# Patient Record
Sex: Female | Born: 1940 | Race: Black or African American | Hispanic: No | Smoking: Former smoker
Health system: Southern US, Community
[De-identification: ages and names within clinical notes are randomized; demographics above are authoritative.]

## PROBLEM LIST (undated history)

## (undated) DIAGNOSIS — M7732 Calcaneal spur, left foot: Principal | ICD-10-CM

## (undated) DIAGNOSIS — E669 Obesity, unspecified: Secondary | ICD-10-CM

## (undated) DIAGNOSIS — E559 Vitamin D deficiency, unspecified: Secondary | ICD-10-CM

## (undated) DIAGNOSIS — M329 Systemic lupus erythematosus, unspecified: Secondary | ICD-10-CM

## (undated) DIAGNOSIS — I10 Essential (primary) hypertension: Secondary | ICD-10-CM

## (undated) DIAGNOSIS — M19071 Primary osteoarthritis, right ankle and foot: Secondary | ICD-10-CM

## (undated) DIAGNOSIS — M170 Bilateral primary osteoarthritis of knee: Secondary | ICD-10-CM

## (undated) DIAGNOSIS — M19072 Primary osteoarthritis, left ankle and foot: Secondary | ICD-10-CM

## (undated) DIAGNOSIS — M19042 Primary osteoarthritis, left hand: Secondary | ICD-10-CM

## (undated) DIAGNOSIS — M19041 Primary osteoarthritis, right hand: Secondary | ICD-10-CM

## (undated) DIAGNOSIS — M7731 Calcaneal spur, right foot: Principal | ICD-10-CM

## (undated) DIAGNOSIS — M109 Gout, unspecified: Secondary | ICD-10-CM

## (undated) HISTORY — DX: Systemic lupus erythematosus, unspecified: M32.9

## (undated) HISTORY — DX: Primary osteoarthritis, left hand: M19.042

## (undated) HISTORY — DX: Primary osteoarthritis, left ankle and foot: M19.072

## (undated) HISTORY — PX: BREAST EXCISIONAL BIOPSY: SUR124

## (undated) HISTORY — DX: Primary osteoarthritis, right ankle and foot: M19.071

## (undated) HISTORY — DX: Calcaneal spur, left foot: M77.32

## (undated) HISTORY — DX: Primary osteoarthritis, right hand: M19.041

## (undated) HISTORY — DX: Bilateral primary osteoarthritis of knee: M17.0

## (undated) HISTORY — DX: Essential (primary) hypertension: I10

## (undated) HISTORY — DX: Obesity, unspecified: E66.9

## (undated) HISTORY — PX: BREAST SURGERY: SHX581

## (undated) HISTORY — DX: Vitamin D deficiency, unspecified: E55.9

## (undated) HISTORY — DX: Gout, unspecified: M10.9

## (undated) HISTORY — DX: Calcaneal spur, right foot: M77.31

---

## 1998-02-13 ENCOUNTER — Ambulatory Visit (HOSPITAL_COMMUNITY): Admission: RE | Admit: 1998-02-13 | Discharge: 1998-02-13 | Payer: Self-pay | Admitting: Obstetrics and Gynecology

## 1999-12-01 ENCOUNTER — Encounter: Payer: Self-pay | Admitting: Surgery

## 1999-12-01 ENCOUNTER — Encounter: Admission: RE | Admit: 1999-12-01 | Discharge: 1999-12-01 | Payer: Self-pay | Admitting: Surgery

## 2000-12-04 ENCOUNTER — Encounter: Payer: Self-pay | Admitting: Surgery

## 2000-12-04 ENCOUNTER — Encounter: Admission: RE | Admit: 2000-12-04 | Discharge: 2000-12-04 | Payer: Self-pay | Admitting: Surgery

## 2002-12-19 ENCOUNTER — Encounter: Payer: Self-pay | Admitting: Surgery

## 2002-12-19 ENCOUNTER — Encounter: Admission: RE | Admit: 2002-12-19 | Discharge: 2002-12-19 | Payer: Self-pay | Admitting: Family Medicine

## 2005-05-25 ENCOUNTER — Other Ambulatory Visit: Admission: RE | Admit: 2005-05-25 | Discharge: 2005-05-25 | Payer: Self-pay | Admitting: Family Medicine

## 2005-08-02 ENCOUNTER — Encounter: Admission: RE | Admit: 2005-08-02 | Discharge: 2005-08-02 | Payer: Self-pay | Admitting: Family Medicine

## 2005-08-29 ENCOUNTER — Encounter: Admission: RE | Admit: 2005-08-29 | Discharge: 2005-08-29 | Payer: Self-pay | Admitting: Family Medicine

## 2007-03-05 ENCOUNTER — Emergency Department (HOSPITAL_COMMUNITY): Admission: EM | Admit: 2007-03-05 | Discharge: 2007-03-05 | Payer: Self-pay | Admitting: Family Medicine

## 2007-04-04 ENCOUNTER — Encounter: Admission: RE | Admit: 2007-04-04 | Discharge: 2007-04-04 | Payer: Self-pay | Admitting: Surgery

## 2013-01-24 ENCOUNTER — Other Ambulatory Visit: Payer: Self-pay

## 2013-01-24 DIAGNOSIS — Z1231 Encounter for screening mammogram for malignant neoplasm of breast: Secondary | ICD-10-CM

## 2013-03-01 ENCOUNTER — Ambulatory Visit
Admission: RE | Admit: 2013-03-01 | Discharge: 2013-03-01 | Disposition: A | Discharge status: Home / Self Care | Payer: No Typology Code available for payment source | Source: Ambulatory Visit

## 2013-03-01 DIAGNOSIS — Z1231 Encounter for screening mammogram for malignant neoplasm of breast: Secondary | ICD-10-CM

## 2014-12-22 ENCOUNTER — Other Ambulatory Visit: Payer: Self-pay

## 2014-12-22 DIAGNOSIS — Z1231 Encounter for screening mammogram for malignant neoplasm of breast: Secondary | ICD-10-CM

## 2015-01-06 ENCOUNTER — Other Ambulatory Visit: Payer: Self-pay | Admitting: Internal Medicine

## 2015-01-06 ENCOUNTER — Ambulatory Visit
Admission: RE | Admit: 2015-01-06 | Discharge: 2015-01-06 | Disposition: A | Discharge status: Home / Self Care | Payer: Commercial Managed Care - HMO | Source: Ambulatory Visit

## 2015-01-06 DIAGNOSIS — R928 Other abnormal and inconclusive findings on diagnostic imaging of breast: Secondary | ICD-10-CM

## 2015-01-06 DIAGNOSIS — Z1231 Encounter for screening mammogram for malignant neoplasm of breast: Secondary | ICD-10-CM

## 2015-01-13 ENCOUNTER — Ambulatory Visit
Admission: RE | Admit: 2015-01-13 | Discharge: 2015-01-13 | Disposition: A | Discharge status: Home / Self Care | Payer: Commercial Managed Care - HMO | Source: Ambulatory Visit | Attending: Internal Medicine | Admitting: Internal Medicine

## 2015-01-13 DIAGNOSIS — R928 Other abnormal and inconclusive findings on diagnostic imaging of breast: Secondary | ICD-10-CM

## 2015-05-08 ENCOUNTER — Other Ambulatory Visit (HOSPITAL_COMMUNITY): Payer: Self-pay | Admitting: Rheumatology

## 2015-05-08 DIAGNOSIS — M79605 Pain in left leg: Secondary | ICD-10-CM

## 2015-05-28 ENCOUNTER — Other Ambulatory Visit: Payer: Self-pay | Admitting: Internal Medicine

## 2015-05-28 DIAGNOSIS — N6320 Unspecified lump in the left breast, unspecified quadrant: Secondary | ICD-10-CM

## 2015-07-14 ENCOUNTER — Ambulatory Visit
Admission: RE | Admit: 2015-07-14 | Discharge: 2015-07-14 | Disposition: A | Discharge status: Home / Self Care | Payer: Commercial Managed Care - HMO | Source: Ambulatory Visit | Attending: Internal Medicine | Admitting: Internal Medicine

## 2015-07-14 DIAGNOSIS — N6320 Unspecified lump in the left breast, unspecified quadrant: Secondary | ICD-10-CM

## 2015-09-02 ENCOUNTER — Other Ambulatory Visit (HOSPITAL_COMMUNITY): Payer: Self-pay | Admitting: Rheumatology

## 2015-09-02 DIAGNOSIS — M79605 Pain in left leg: Secondary | ICD-10-CM

## 2015-09-15 ENCOUNTER — Ambulatory Visit (HOSPITAL_COMMUNITY)
Admission: RE | Admit: 2015-09-15 | Discharge: 2015-09-15 | Disposition: A | Discharge status: Home / Self Care | Payer: Commercial Managed Care - HMO | Source: Ambulatory Visit | Attending: Rheumatology | Admitting: Rheumatology

## 2015-09-15 DIAGNOSIS — M170 Bilateral primary osteoarthritis of knee: Secondary | ICD-10-CM | POA: Insufficient documentation

## 2015-09-15 DIAGNOSIS — M79605 Pain in left leg: Secondary | ICD-10-CM | POA: Diagnosis present

## 2015-09-15 DIAGNOSIS — M11262 Other chondrocalcinosis, left knee: Secondary | ICD-10-CM | POA: Insufficient documentation

## 2015-09-15 DIAGNOSIS — M11261 Other chondrocalcinosis, right knee: Secondary | ICD-10-CM | POA: Insufficient documentation

## 2015-12-09 ENCOUNTER — Other Ambulatory Visit: Payer: Self-pay | Admitting: Internal Medicine

## 2015-12-09 DIAGNOSIS — N63 Unspecified lump in unspecified breast: Secondary | ICD-10-CM

## 2016-01-12 ENCOUNTER — Ambulatory Visit
Admission: RE | Admit: 2016-01-12 | Discharge: 2016-01-12 | Disposition: A | Discharge status: Home / Self Care | Payer: Commercial Managed Care - HMO | Source: Ambulatory Visit | Attending: Internal Medicine | Admitting: Internal Medicine

## 2016-01-12 DIAGNOSIS — N63 Unspecified lump in unspecified breast: Secondary | ICD-10-CM

## 2016-06-21 ENCOUNTER — Ambulatory Visit: Payer: Self-pay | Admitting: Rheumatology

## 2016-07-05 LAB — HEPATIC FUNCTION PANEL
ALK PHOS: 78 U/L (ref 25–125)
ALT: 49 U/L — AB (ref 7–35)
AST: 79 U/L — AB (ref 13–35)
Bilirubin, Total: 0.6 mg/dL

## 2016-07-05 LAB — BASIC METABOLIC PANEL
BUN: 14 mg/dL (ref 4–21)
CREATININE: 0.7 mg/dL (ref 0.5–1.1)
Glucose: 122 mg/dL
POTASSIUM: 3.5 mmol/L (ref 3.4–5.3)
SODIUM: 139 mmol/L (ref 137–147)

## 2016-07-05 LAB — CBC AND DIFFERENTIAL
HCT: 39 % (ref 36–46)
Hemoglobin: 13.2 g/dL (ref 12.0–16.0)
PLATELETS: 168 10*3/uL (ref 150–399)
WBC: 3.3 10^3/mL

## 2016-07-13 ENCOUNTER — Telehealth: Payer: Self-pay | Admitting: Radiology

## 2016-07-13 NOTE — Telephone Encounter (Signed)
Need to PA her Folic Acid [id number]

## 2016-07-14 NOTE — Telephone Encounter (Signed)
Completed a PA request via cover my meds

## 2016-07-15 ENCOUNTER — Telehealth: Payer: Self-pay | Admitting: Radiology

## 2016-07-15 NOTE — Telephone Encounter (Signed)
[facility] has denied the Folic acid Rx

## 2016-07-18 ENCOUNTER — Encounter: Payer: Self-pay | Admitting: *Deleted

## 2016-07-18 DIAGNOSIS — E559 Vitamin D deficiency, unspecified: Secondary | ICD-10-CM

## 2016-07-18 DIAGNOSIS — M19041 Primary osteoarthritis, right hand: Secondary | ICD-10-CM

## 2016-07-18 DIAGNOSIS — M329 Systemic lupus erythematosus, unspecified: Secondary | ICD-10-CM

## 2016-07-18 DIAGNOSIS — M19042 Primary osteoarthritis, left hand: Secondary | ICD-10-CM

## 2016-07-18 DIAGNOSIS — M19071 Primary osteoarthritis, right ankle and foot: Secondary | ICD-10-CM

## 2016-07-18 DIAGNOSIS — I10 Essential (primary) hypertension: Secondary | ICD-10-CM

## 2016-07-18 DIAGNOSIS — M170 Bilateral primary osteoarthritis of knee: Secondary | ICD-10-CM

## 2016-07-18 DIAGNOSIS — M7732 Calcaneal spur, left foot: Principal | ICD-10-CM

## 2016-07-18 DIAGNOSIS — E669 Obesity, unspecified: Secondary | ICD-10-CM

## 2016-07-18 DIAGNOSIS — M19072 Primary osteoarthritis, left ankle and foot: Secondary | ICD-10-CM

## 2016-07-18 DIAGNOSIS — M7731 Calcaneal spur, right foot: Secondary | ICD-10-CM | POA: Insufficient documentation

## 2016-07-18 HISTORY — DX: Systemic lupus erythematosus, unspecified: M32.9

## 2016-07-18 HISTORY — DX: Primary osteoarthritis, left ankle and foot: M19.071

## 2016-07-18 HISTORY — DX: Vitamin D deficiency, unspecified: E55.9

## 2016-07-18 HISTORY — DX: Obesity, unspecified: E66.9

## 2016-07-18 HISTORY — DX: Bilateral primary osteoarthritis of knee: M17.0

## 2016-07-18 HISTORY — DX: Primary osteoarthritis, right hand: M19.041

## 2016-07-18 HISTORY — DX: Essential (primary) hypertension: I10

## 2016-07-18 HISTORY — DX: Calcaneal spur, right foot: M77.31

## 2016-07-19 ENCOUNTER — Ambulatory Visit (INDEPENDENT_AMBULATORY_CARE_PROVIDER_SITE_OTHER): Payer: Commercial Managed Care - HMO | Admitting: Rheumatology

## 2016-07-19 ENCOUNTER — Encounter: Payer: Self-pay | Admitting: Rheumatology

## 2016-07-19 VITALS — BP 131/80 | HR 79 | Resp 14 | Ht 64.0 in | Wt 188.0 lb

## 2016-07-19 DIAGNOSIS — M19041 Primary osteoarthritis, right hand: Secondary | ICD-10-CM | POA: Diagnosis not present

## 2016-07-19 DIAGNOSIS — R7989 Other specified abnormal findings of blood chemistry: Secondary | ICD-10-CM

## 2016-07-19 DIAGNOSIS — R768 Other specified abnormal immunological findings in serum: Secondary | ICD-10-CM | POA: Insufficient documentation

## 2016-07-19 DIAGNOSIS — M19042 Primary osteoarthritis, left hand: Secondary | ICD-10-CM | POA: Diagnosis not present

## 2016-07-19 DIAGNOSIS — M170 Bilateral primary osteoarthritis of knee: Secondary | ICD-10-CM | POA: Diagnosis not present

## 2016-07-19 DIAGNOSIS — M19079 Primary osteoarthritis, unspecified ankle and foot: Secondary | ICD-10-CM | POA: Diagnosis not present

## 2016-07-19 DIAGNOSIS — M25511 Pain in right shoulder: Secondary | ICD-10-CM

## 2016-07-19 DIAGNOSIS — Z79899 Other long term (current) drug therapy: Secondary | ICD-10-CM | POA: Diagnosis not present

## 2016-07-19 DIAGNOSIS — R945 Abnormal results of liver function studies: Secondary | ICD-10-CM

## 2016-07-19 DIAGNOSIS — M329 Systemic lupus erythematosus, unspecified: Secondary | ICD-10-CM | POA: Diagnosis not present

## 2016-07-19 MED ORDER — FOLIC ACID 1 MG PO TABS: 1.0000 mg | ORAL_TABLET | Freq: Every day | ORAL | 4 refills | Status: DC

## 2016-07-19 MED ORDER — METHOTREXATE 2.5 MG PO TABS: 7.5000 mg | ORAL_TABLET | ORAL | 2 refills | Status: DC

## 2016-07-19 MED ORDER — LIDOCAINE HCL 1 % IJ SOLN
1.0000 mL | INTRAMUSCULAR | Status: AC | PRN
  Administered 2016-07-19: 1 mL

## 2016-07-19 MED ORDER — TRIAMCINOLONE ACETONIDE 40 MG/ML IJ SUSP
40.0000 mg | INTRAMUSCULAR | Status: AC | PRN
  Administered 2016-07-19: 40 mg via INTRA_ARTICULAR

## 2016-07-19 NOTE — Progress Notes (Signed)
*IMAGE* Office Visit Note  Patient: Deanna Hart             Date of Birth: Feb 16, 1941           MRN: [id number]             PCP: Salena Saner., MD Referring: No ref. provider found Visit Date: 07/19/2016 Occupation:     Subjective:  Follow-up Follow-up on lupus and high risk prescription.  History of Present Illness: Deanna Hart is a 75 y.o. female last seen in our office on 03/15/2016 at which time I followed her up on lupus. She was not having any flare on the June 2017 visit. She had no oral or nasal ulcers no fatigue at that time. She was responding well to methotrexate 3 pills per week and folic acid 1 pill per day.   Today she states that she is having ongoing right shoulder joint pain. She states "I wish I could get a shot for that in my shoulder". She is also recently finished her nitrofurantoin prescribed by Dr. Priscille Loveless for urinary tract infection. Patient initially had symptoms of severe urinary burning. Now after taking her antibiotic, she is all better.  She and is seeing Dr. Priscille Loveless who is at [place], [place] [address]     Patient is also complaining of right shoulder joint pain. She specifically requesting cortisone injection. Today her blood pressure is within normal limits and due to the decreased range of motion and pain I'LL  happy to give her the injection.    Activities of Daily Living:  Patient reports morning stiffness for 15 minutes.   Patient Reports nocturnal pain.  Difficulty dressing/grooming: Reports Difficulty climbing stairs: Reports Difficulty getting out of chair: Reports Difficulty using hands for taps, buttons, cutlery, and/or writing: Reports   Review of Systems  Constitutional: Negative for fatigue.  HENT: Negative for mouth sores and mouth dryness.   Eyes: Negative for dryness.  Respiratory: Negative for shortness of breath.   Gastrointestinal: Negative for constipation and diarrhea.   Musculoskeletal: Negative for myalgias and myalgias.  Skin: Negative for sensitivity to sunlight.  Psychiatric/Behavioral: Negative for decreased concentration and sleep disturbance.    PMFS History:  Patient Active Problem List   Diagnosis Date Noted  . High risk medications (not anticoagulants) long-term use 07/19/2016  . Rheumatoid factor positive 07/19/2016  . SLE (systemic lupus erythematosus) ([place]) 07/18/2016  . Osteoarthritis of both hands 07/18/2016  . Osteoarthritis of both feet 07/18/2016  . Calcaneal spur of both feet 07/18/2016  . Osteoarthritis of both knees 07/18/2016  . Hypertension 07/18/2016  . Obesity 07/18/2016  . Vitamin D deficiency 07/18/2016    Past Medical History:  Diagnosis Date  . Calcaneal spur of both feet 07/18/2016  . Hypertension 07/18/2016  . Obesity 07/18/2016  . Osteoarthritis of both feet 07/18/2016  . Osteoarthritis of both hands 07/18/2016  . 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   Marland Kitchen SLE (systemic lupus erythematosus) ([place]) 07/18/2016   Positive ANA, Positive Ro, Positive Smith, Positive RNP, Positive RF, Negative CCP,  Elevated LFTs  . Vitamin D deficiency 07/18/2016    History reviewed. No pertinent family history. History reviewed. No pertinent surgical history. Social History   Social History Narrative  . No narrative on file     Objective: Vital Signs: BP 131/80 (BP Location: Left Arm, Patient Position: Sitting, Cuff Size: Large)   Pulse 79   Resp  14   Ht 5\' 4"  (1.626 m)   Wt 188 lb (85.3 kg)   BMI 32.27 kg/m    Physical Exam  Constitutional: She is oriented to person, place, and time. She appears well-developed and well-nourished.  HENT:  Head: Normocephalic and atraumatic.  Eyes: EOM are normal. Pupils are equal, round, and reactive to light.  Cardiovascular: Normal rate, regular rhythm and normal heart sounds.  Exam reveals no gallop and no friction rub.   No murmur  heard. Pulmonary/Chest: Effort normal and breath sounds normal. She has no wheezes. She has no rales.  Abdominal: Soft. Bowel sounds are normal. She exhibits no distension. There is no tenderness. There is no guarding. No hernia.  Musculoskeletal: Normal range of motion. She exhibits no edema, tenderness or deformity.  Lymphadenopathy:    She has no cervical adenopathy.  Neurological: She is alert and oriented to person, place, and time. Coordination normal.  Skin: Skin is warm and dry. Capillary refill takes less than 2 seconds. No rash noted.  Psychiatric: She has a normal mood and affect. Her behavior is normal.  Nursing note and vitals reviewed.    Musculoskeletal Exam:  Full range of motion of all joints prescription strength is equal and strong bilaterally  fibromyalgia tender points are all absent  CDAI Exam: No CDAI exam completed.    Investigation: Findings:  Labs from 07/04/2016 shows CMP with GFR normal except for nonfasting glucose elevated at 122 AST is 79 A.l. T is 49. Both of these are elevated and we will monitor. She is actually on methotrexate 3 pills per week and we are unable to decrease methotrexate any further at this time. CBC with differential is normal except for WBC slightly low at 3.3 which we will monitor.  Labs from 03/15/2016 shows lupus anticoagulant negative. C3-C4 is normal ANA with titer is positive with a titer of 1:160 UA is normal Urine microscopic is normal.  =============== Here is the impresion and plan from last visit ==> IMPRESSION/PLAN:   1.  Systemic lupus erythematosus.  Positive ANA, Ro, Smith, RNP and rheumatoid factor.  No flare.  Doing well.  No oral or nasal ulcers.  No fatigue.  No new rash. 2.  High-risk prescription.  Labs up to date and normal and are due now.  Taking methotrexate 3 per week, folic acid 1 per day. 3.  OA of the hands.  DIP and PIP prominence bilaterally. 4.  OA of the feet and OA of the knee joint,  ongoing. 5.  CBC with diff, CMP with GFR, urinalysis today and then every 3 months. 6.  Add lupus anticoagulant, ANA with titer, ENA, C3, C4 to be done in 3 months.  7.  Refill methotrexate 3 per week, after the labs are up to date and are normal. 8.  Return to clinic in 4 months for regular followup.       Imaging: No results found.  Speciality Comments: No specialty comments available.    Procedures:  Large Joint Inj Date/Time: 07/19/2016 2:25 PM Performed by: Eliezer Lofts Authorized by: Eliezer Lofts   Consent Given by:  Patient Site marked: the procedure site was marked   Timeout: prior to procedure the correct patient, procedure, and site was verified   Indications:  Pain Location:  Shoulder Site:  R glenohumeral Prep: patient was prepped and draped in usual sterile fashion   Needle Size:  27 G Needle Length:  1.5 inches Approach:  Posterior Ultrasound Guidance: No   Fluoroscopic Guidance:  No   Arthrogram: No Medications:  1 mL lidocaine 1 %; 40 mg triamcinolone acetonide 40 MG/ML Aspiration Attempted: Yes   Aspirate amount (mL):  0 Patient tolerance:  Patient tolerated the procedure well with no immediate complications Comments: Patient is having right shoulder joint pain and is requesting cortisone injection in the right shoulder.  Blood pressure within normal limits.  After informed consent was obtained, the site was prepped in usual sterile fashion and injected with 40 mg of Kenalog mixed with 1 mL 1% lidocaine. Patient tolerated procedure well. There are no complications. There was no epinephrine and lidocaine.   Patient was over 90% better 01 minute after the injection. She tolerated procedure well. She is not allergic to lidocaine Betadine latex. Allergies: Plaquenil [hydroxychloroquine sulfate]   Assessment / Plan: Visit Diagnoses: Systemic lupus erythematosus, unspecified SLE type, unspecified organ involvement status ([facility])  High risk medications  (not anticoagulants) long-term use - MTX 3 pills per week every fridayFolic Acid 1mg  / day - Plan: CBC with Differential/Platelet, COMPLETE METABOLIC PANEL WITH GFR  Osteoarthritis of both hands, unspecified osteoarthritis type  Osteoarthritis of ankle and foot, unspecified laterality  Osteoarthritis of both knees, unspecified osteoarthritis type  Rheumatoid factor positive  Acute pain of right shoulder  Elevated liver function tests    Orders: Orders Placed This Encounter  Procedures  . Large Joint Injection/Arthrocentesis  . CBC with Differential/Platelet  . COMPLETE METABOLIC PANEL WITH GFR   Meds ordered this encounter  Medications  . DISCONTD: methotrexate (RHEUMATREX) 2.5 MG tablet    Sig: Take 3 tablets by mouth once a week.  . traMADol (ULTRAM) 50 MG tablet  . methotrexate (RHEUMATREX) 2.5 MG tablet    Sig: Take 3 tablets (7.5 mg total) by mouth once a week.    Dispense:  4 tablet    Refill:  2  . folic acid (FOLVITE) 1 MG tablet    Sig: Take 1 tablet (1 mg total) by mouth daily. This vitamin is essential for patient due to use of MTX. Patient cannot afford OTC vitamin. Without FOLIC ACID, pt may flare.    Dispense:  90 tablet    Refill:  4    Face-to-face time spent with patient was 30 minutes. 50% of time was spent in counseling and coordination of care.  Follow-Up Instructions: Return in about 4 months (around 11/16/2016) for SLE, HRRX, LFT's, right SJ Pain, .  I examined and evaluated the patient with Eliezer Lofts PA. The plan of care was discussed as noted above.  Bo Merino, MD

## 2016-07-19 NOTE — Patient Instructions (Signed)

## 2016-10-08 ENCOUNTER — Other Ambulatory Visit: Payer: Self-pay | Admitting: Rheumatology

## 2016-10-10 ENCOUNTER — Telehealth: Payer: Self-pay | Admitting: Rheumatology

## 2016-10-10 NOTE — Telephone Encounter (Signed)
LMOM for patient to call back to schedule appointment.  

## 2016-10-10 NOTE — Telephone Encounter (Signed)
-----   Message from Candice Camp, RT sent at 10/10/2016  8:44 AM EST ----- Patient needs follow up Feb/ March

## 2016-10-10 NOTE — Telephone Encounter (Signed)
I have called patient to advise labs are due Sent message for front desk to make a follow up appointment. She states she can come in tomorrow.

## 2016-10-11 ENCOUNTER — Other Ambulatory Visit: Payer: Self-pay | Admitting: Radiology

## 2016-10-11 ENCOUNTER — Other Ambulatory Visit: Payer: Self-pay | Admitting: *Deleted

## 2016-10-11 DIAGNOSIS — Z79899 Other long term (current) drug therapy: Secondary | ICD-10-CM

## 2016-10-11 LAB — CBC WITH DIFFERENTIAL/PLATELET
BASOS PCT: 0 %
Basophils Absolute: 0 cells/uL (ref 0–200)
EOS PCT: 1 %
Eosinophils Absolute: 41 cells/uL (ref 15–500)
HCT: 38.7 % (ref 35.0–45.0)
HEMOGLOBIN: 12.8 g/dL (ref 11.7–15.5)
LYMPHS ABS: 1599 {cells}/uL (ref 850–3900)
Lymphocytes Relative: 39 %
MCH: 32.4 pg (ref 27.0–33.0)
MCHC: 33.1 g/dL (ref 32.0–36.0)
MCV: 98 fL (ref 80.0–100.0)
MPV: 10.9 fL (ref 7.5–12.5)
Monocytes Absolute: 656 cells/uL (ref 200–950)
Monocytes Relative: 16 %
NEUTROS ABS: 1804 {cells}/uL (ref 1500–7800)
NEUTROS PCT: 44 %
Platelets: 180 10*3/uL (ref 140–400)
RBC: 3.95 MIL/uL (ref 3.80–5.10)
RDW: 14.4 % (ref 11.0–15.0)
WBC: 4.1 10*3/uL (ref 3.8–10.8)

## 2016-10-11 LAB — COMPLETE METABOLIC PANEL WITH GFR
ALBUMIN: 3.6 g/dL (ref 3.6–5.1)
ALK PHOS: 57 U/L (ref 33–130)
ALT: 39 U/L — AB (ref 6–29)
AST: 67 U/L — AB (ref 10–35)
BUN: 12 mg/dL (ref 7–25)
CHLORIDE: 104 mmol/L (ref 98–110)
CO2: 28 mmol/L (ref 20–31)
Calcium: 9.1 mg/dL (ref 8.6–10.4)
Creat: 0.61 mg/dL (ref 0.60–0.93)
GFR, Est African American: >89 mL/min (ref >=60)
GFR, Est Non African American: 88 mL/min (ref >=60)
GLUCOSE: 88 mg/dL (ref 65–99)
POTASSIUM: 4.1 mmol/L (ref 3.5–5.3)
SODIUM: 138 mmol/L (ref 135–146)
Total Bilirubin: 0.8 mg/dL (ref 0.2–1.2)
Total Protein: 7.3 g/dL (ref 6.1–8.1)

## 2016-10-11 MED ORDER — METHOTREXATE 2.5 MG PO TABS: 7.5000 mg | ORAL_TABLET | ORAL | 0 refills | Status: DC

## 2016-10-11 NOTE — Telephone Encounter (Signed)
Patient came in today for labs/ per Dr Merrilee Jansky to refill Methotrexate

## 2016-10-12 NOTE — Progress Notes (Signed)
Andrea,It's okay to refill patient's methotrexate.She has a history of elevated AST and ALT but it's improved versus the one from 3 months ago.I think you sent me a requests refill methotrexate but I signed off on his incentive approving it.If you will resend it to me or go ahead and send it in

## 2016-10-12 NOTE — Telephone Encounter (Signed)
Last Visit: 07/19/16 Next Visit: 11/29/16 Labs: 10/11/16 AST: 67 ALT: 39  Okay to refill MTX?

## 2016-10-12 NOTE — Progress Notes (Signed)
CMP with GFR is normal except for mild elevation of AST ALT which have already discussed above.CBC with differential is normal.

## 2016-11-15 ENCOUNTER — Ambulatory Visit: Payer: Commercial Managed Care - HMO | Admitting: Rheumatology

## 2016-11-28 DIAGNOSIS — M25511 Pain in right shoulder: Secondary | ICD-10-CM | POA: Insufficient documentation

## 2016-11-28 NOTE — Progress Notes (Signed)
Office Visit Note  Patient: Deanna Hart             Date of Birth: [birth year]           MRN: [id number]             PCP: Salena Saner., MD Referring: [name], MD Visit Date: 11/29/2016 Occupation: @GUAROCC @    Subjective: left knee pain   History of Present Illness: Deanna Hart is a 76 y.o. female with history of systemic lupus erythematosus and osteoarthritis. She states she's been doing fairly well except for some discomfort in her left knee off and on. She has noticed occasional swelling.  Activities of Daily Living:  Patient reports morning stiffness for 0 minute.   Patient Denies nocturnal pain.  Difficulty dressing/grooming: Denies Difficulty climbing stairs: Denies Difficulty getting out of chair: Denies Difficulty using hands for taps, buttons, cutlery, and/or writing: Denies   Review of Systems  Constitutional: Negative for fatigue, night sweats, weight gain, weight loss and weakness.  HENT: Negative for mouth sores, trouble swallowing, trouble swallowing, mouth dryness and nose dryness.   Eyes: Negative for pain, redness, visual disturbance and dryness.  Respiratory: Negative for cough, shortness of breath and difficulty breathing.   Cardiovascular: Negative for chest pain, palpitations, hypertension, irregular heartbeat and swelling in legs/feet.  Gastrointestinal: Negative for blood in stool, constipation and diarrhea.  Endocrine: Negative for increased urination.  Genitourinary: Negative for vaginal dryness.  Musculoskeletal: Positive for arthralgias and joint pain. Negative for joint swelling, myalgias, muscle weakness, morning stiffness, muscle tenderness and myalgias.  Skin: Negative for color change, rash, hair loss, skin tightness, ulcers and sensitivity to sunlight.  Allergic/Immunologic: Negative for susceptible to infections.  Neurological: Negative for dizziness, memory loss and night sweats.  Hematological: Negative for  swollen glands.  Psychiatric/Behavioral: Negative for depressed mood and sleep disturbance. The patient is not nervous/anxious.     PMFS History:  Patient Active Problem List   Diagnosis Date Noted  . Acute pain of right shoulder 11/28/2016  . High risk medications (not anticoagulants) long-term use 07/19/2016  . Rheumatoid factor positive 07/19/2016  . SLE (systemic lupus erythematosus) ([place]) 07/18/2016  . Primary osteoarthritis of both hands 07/18/2016  . Primary osteoarthritis of both feet 07/18/2016  . Calcaneal spur of both feet 07/18/2016  . Osteoarthritis of both knees 07/18/2016  . Hypertension 07/18/2016  . Obesity 07/18/2016  . Vitamin D deficiency 07/18/2016    Past Medical History:  Diagnosis Date  . Calcaneal spur of both feet 07/18/2016  . Hypertension 07/18/2016  . Obesity 07/18/2016  . Osteoarthritis of both feet 07/18/2016  . Osteoarthritis of both hands 07/18/2016  . 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   Marland Kitchen SLE (systemic lupus erythematosus) ([place]) 07/18/2016   Positive ANA, Positive Ro, Positive Smith, Positive RNP, Positive RF, Negative CCP,  Elevated LFTs  . Vitamin D deficiency 07/18/2016    No family history on file. No past surgical history on file. Social History   Social History Narrative  . No narrative on file     Objective: Vital Signs: BP (!) 171/93   Pulse 69   Resp 12   Ht 5\' 4"  (1.626 m)   Wt 188 lb (85.3 kg)   BMI 32.27 kg/m    Physical Exam  Constitutional: She is oriented to person, place, and time. She appears well-developed and well-nourished.  HENT:  Head: Normocephalic and atraumatic.  Eyes: Conjunctivae and EOM are normal.  Neck: Normal range of motion.  Cardiovascular: Normal rate, regular rhythm, normal heart sounds and intact distal pulses.   Pulmonary/Chest: Effort normal and breath sounds normal.  Abdominal: Soft. Bowel sounds are normal.  Lymphadenopathy:    She has no  cervical adenopathy.  Neurological: She is alert and oriented to person, place, and time.  Skin: Skin is warm and dry. Capillary refill takes less than 2 seconds.  Psychiatric: She has a normal mood and affect. Her behavior is normal.  Nursing note and vitals reviewed.    Musculoskeletal Exam: C-spine and thoracic lumbar spine good range of motion. Shoulder joints elbow joints good range of motion. She had no synovitis over her MCP joints. She has thickening of PIP/DIP and CMC joints consistent with osteoarthritis. She is good range of motion of her hip joints. She has crepitus with range of motion of bilateral knee joints some warmth on palpation of her left knee joint.  CDAI Exam: No CDAI exam completed.    Investigation: Findings:  Labs from 07/04/2016 shows CMP with GFR normal except for nonfasting glucose elevated at 122 AST is 79 A.l. T is 49. Both of these are elevated and we will monitor. She is actually on methotrexate 3 pills per week and we are unable to decrease methotrexate any further at this time. CBC with differential is normal except for WBC slightly low at 3.3 which we will monitor.  Labs from 03/15/2016 shows lupus anticoagulant negative. C3-C4 is normal ANA with titer is positive with a titer of 1:160 UA is normal Urine microscopic is normal.      Imaging: No results found.  Speciality Comments: No specialty comments available.    Procedures:  No procedures performed Allergies: Plaquenil [hydroxychloroquine sulfate]   Assessment / Plan:     Visit Diagnoses: Systemic lupus erythematosus, unspecified SLE type, unspecified organ involvement status ([place]) - History of ANA, Ro, Smith, RNP, RF, skin biopsy positive, elevated LFTs - Plan: Urinalysis, Routine w reflex microscopic. She is clinically doing well without any flare  High risk medications (not anticoagulants) long-term use - MTX- 2.5mg  3 pills per week q fridayFolic Acid 1mg  / day  - Plan: CBC with  Differential/Platelet, COMPLETE METABOLIC PANEL WITH GFR, CBC with Differential/Platelet, COMPLETE METABOLIC PANEL WITH GFR  Primary osteoarthritis of both hands: Joint protection and muscle strengthening discussed  Primary osteoarthritis of both knees: Weight loss muscle strengthening discussed  Primary osteoarthritis of both feet: Proper fitting shoes discussed  Acute pain of right shoulder  Rheumatoid factor positive    Orders: Orders Placed This Encounter  Procedures  . CBC with Differential/Platelet  . COMPLETE METABOLIC PANEL WITH GFR  . CBC with Differential/Platelet  . COMPLETE METABOLIC PANEL WITH GFR  . Urinalysis, Routine w reflex microscopic   No orders of the defined types were placed in this encounter.   Face-to-face time spent with patient was 30 minutes. 50% of time was spent in counseling and coordination of care.  Follow-Up Instructions: Return in about 5 months (around 05/01/2017) for Systemic lupus.   Bo Merino, MD  Note - This record has been created using Editor, commissioning.  Chart creation errors have been sought, but may not always  have been located. Such creation errors do not reflect on  the standard of medical care.

## 2016-11-29 ENCOUNTER — Encounter: Payer: Self-pay | Admitting: Rheumatology

## 2016-11-29 ENCOUNTER — Ambulatory Visit (INDEPENDENT_AMBULATORY_CARE_PROVIDER_SITE_OTHER): Payer: Commercial Managed Care - HMO | Admitting: Rheumatology

## 2016-11-29 VITALS — BP 171/93 | HR 69 | Resp 12 | Ht 64.0 in | Wt 188.0 lb

## 2016-11-29 DIAGNOSIS — M170 Bilateral primary osteoarthritis of knee: Secondary | ICD-10-CM | POA: Diagnosis not present

## 2016-11-29 DIAGNOSIS — Z79899 Other long term (current) drug therapy: Secondary | ICD-10-CM | POA: Diagnosis not present

## 2016-11-29 DIAGNOSIS — M19041 Primary osteoarthritis, right hand: Secondary | ICD-10-CM

## 2016-11-29 DIAGNOSIS — M329 Systemic lupus erythematosus, unspecified: Secondary | ICD-10-CM

## 2016-11-29 DIAGNOSIS — M19042 Primary osteoarthritis, left hand: Secondary | ICD-10-CM

## 2016-11-29 DIAGNOSIS — R768 Other specified abnormal immunological findings in serum: Secondary | ICD-10-CM

## 2016-11-29 DIAGNOSIS — M25511 Pain in right shoulder: Secondary | ICD-10-CM

## 2016-11-29 DIAGNOSIS — M19072 Primary osteoarthritis, left ankle and foot: Secondary | ICD-10-CM

## 2016-11-29 DIAGNOSIS — M19071 Primary osteoarthritis, right ankle and foot: Secondary | ICD-10-CM

## 2016-11-29 LAB — COMPLETE METABOLIC PANEL WITH GFR
ALBUMIN: 4 g/dL (ref 3.6–5.1)
ALT: 74 U/L — ABNORMAL HIGH (ref 6–29)
AST: 127 U/L — ABNORMAL HIGH (ref 10–35)
Alkaline Phosphatase: 70 U/L (ref 33–130)
BILIRUBIN TOTAL: 0.7 mg/dL (ref 0.2–1.2)
BUN: 9 mg/dL (ref 7–25)
CALCIUM: 9.1 mg/dL (ref 8.6–10.4)
CO2: 30 mmol/L (ref 20–31)
Chloride: 100 mmol/L (ref 98–110)
Creat: 0.7 mg/dL (ref 0.60–0.93)
GFR, EST NON AFRICAN AMERICAN: 84 mL/min (ref >=60)
Glucose, Bld: 84 mg/dL (ref 65–99)
POTASSIUM: 3.5 mmol/L (ref 3.5–5.3)
Sodium: 138 mmol/L (ref 135–146)
TOTAL PROTEIN: 8 g/dL (ref 6.1–8.1)

## 2016-11-29 LAB — CBC WITH DIFFERENTIAL/PLATELET
BASOS ABS: 0 {cells}/uL (ref 0–200)
Basophils Relative: 0 %
EOS ABS: 36 {cells}/uL (ref 15–500)
Eosinophils Relative: 1 %
HEMATOCRIT: 40.6 % (ref 35.0–45.0)
HEMOGLOBIN: 13.6 g/dL (ref 11.7–15.5)
LYMPHS ABS: 1296 {cells}/uL (ref 850–3900)
Lymphocytes Relative: 36 %
MCH: 32.9 pg (ref 27.0–33.0)
MCHC: 33.5 g/dL (ref 32.0–36.0)
MCV: 98.1 fL (ref 80.0–100.0)
MPV: 10.7 fL (ref 7.5–12.5)
Monocytes Absolute: 468 cells/uL (ref 200–950)
Monocytes Relative: 13 %
NEUTROS PCT: 50 %
Neutro Abs: 1800 cells/uL (ref 1500–7800)
Platelets: 198 10*3/uL (ref 140–400)
RBC: 4.14 MIL/uL (ref 3.80–5.10)
RDW: 13.8 % (ref 11.0–15.0)
WBC: 3.6 10*3/uL — ABNORMAL LOW (ref 3.8–10.8)

## 2016-11-29 NOTE — Patient Instructions (Signed)
Standing Labs We placed an order today for your standing lab work.    Please come back and get your standing labs in June and every 3 months  We have open lab Monday through Friday from 8:30-11:30 AM and 1:30-4 PM at the office of Dr. Jong Rickman/Naitik Panwala, PA.   The office is located at [address], [address], [place], [place] [address] No appointment is necessary.   Labs are drawn by [facility].  You may receive a bill from [facility] for your lab work.    

## 2016-11-30 LAB — URINALYSIS, ROUTINE W REFLEX MICROSCOPIC
Bilirubin Urine: NEGATIVE
GLUCOSE, UA: NEGATIVE
HGB URINE DIPSTICK: NEGATIVE
Ketones, ur: NEGATIVE
Nitrite: NEGATIVE
PROTEIN: NEGATIVE
Specific Gravity, Urine: 1.011 (ref 1.001–1.035)
pH: 6.5 (ref 5.0–8.0)

## 2016-11-30 LAB — URINALYSIS, MICROSCOPIC ONLY
Casts: NONE SEEN [LPF]
Crystals: NONE SEEN [HPF]
RBC / HPF: NONE SEEN RBC/HPF (ref <=2)
YEAST: NONE SEEN [HPF]

## 2016-11-30 NOTE — Progress Notes (Signed)
Please call patient to check if she is taking any of the medications recently which could have affected her liver function. She should not take any anti-inflammatories. She should not drink alcohol. We should repeat her LFTs in 2 weeks.

## 2016-12-13 ENCOUNTER — Other Ambulatory Visit: Payer: Self-pay | Admitting: *Deleted

## 2016-12-13 DIAGNOSIS — Z79899 Other long term (current) drug therapy: Secondary | ICD-10-CM

## 2016-12-14 ENCOUNTER — Other Ambulatory Visit: Payer: Self-pay | Admitting: Family

## 2016-12-14 DIAGNOSIS — N6320 Unspecified lump in the left breast, unspecified quadrant: Secondary | ICD-10-CM

## 2016-12-14 LAB — COMPLETE METABOLIC PANEL WITH GFR
ALT: 68 U/L — ABNORMAL HIGH (ref 6–29)
AST: 121 U/L — AB (ref 10–35)
Albumin: 3.8 g/dL (ref 3.6–5.1)
Alkaline Phosphatase: 68 U/L (ref 33–130)
BUN: 14 mg/dL (ref 7–25)
CALCIUM: 9.2 mg/dL (ref 8.6–10.4)
CHLORIDE: 101 mmol/L (ref 98–110)
CO2: 26 mmol/L (ref 20–31)
Creat: 0.68 mg/dL (ref 0.60–0.93)
GFR, Est Non African American: 85 mL/min (ref >=60)
Glucose, Bld: 124 mg/dL — ABNORMAL HIGH (ref 65–99)
POTASSIUM: 3.6 mmol/L (ref 3.5–5.3)
Sodium: 136 mmol/L (ref 135–146)
Total Bilirubin: 0.6 mg/dL (ref 0.2–1.2)
Total Protein: 7.9 g/dL (ref 6.1–8.1)

## 2016-12-14 NOTE — Progress Notes (Signed)
Patient should discontinue methotrexate. Repeat LFTs and TPMT in 2 weeks if it's still elevated will refer her to GI. [facility] in [facility]

## 2016-12-27 NOTE — Progress Notes (Signed)
Office Visit Note  Patient: Deanna Hart             Date of Birth: 01-28-1941           MRN: [id number]             PCP: Salena Saner., MD Referring: [name], MD Visit Date: 01/10/2017 Occupation: '@GUAROCC' @    Subjective:  Left knee pain  History of Present Illness: Deanna Hart is a 76 y.o. female with a history of SLE and OA who presents for follow-up.  She reports she has discontinued her Methotrexate due to elevation of LFTs.  Patient reports she has missed 5 doses. She states she is still taking her folic acid daily.  Patient denies any side effects since stopping methotrexate.  Patient states she has not changed any other medications recently and denies alcohol use. Patient states she does not want to be put back on MTX.    Patient states she is currently having left knee swelling, discomfort, and "heat."  Patient states her last knee injection helped and lasted a long time.  Patient states her right knee has some discomfort, especially if she is on it for a long period of time.     She reports some pedal edema in the evening.  Patient reports her pain is 5/10.  Denies any morning stiffness. Patient desires a cortisone injection today.    Activities of Daily Living:  Patient reports morning stiffness for 0 minutes.   Patient Denies nocturnal pain.  Difficulty dressing/grooming: Denies Difficulty climbing stairs: Denies Difficulty getting out of chair: Denies Difficulty using hands for taps, buttons, cutlery, and/or writing: Denies   Review of Systems  Constitutional: Negative for fatigue, night sweats, weight gain, weight loss and weakness.  HENT: Negative for mouth sores, trouble swallowing, trouble swallowing, mouth dryness and nose dryness.   Eyes: Negative for pain, redness, visual disturbance and dryness.  Respiratory: Negative for cough, shortness of breath and difficulty breathing.   Cardiovascular: Negative for chest pain, palpitations,  hypertension, irregular heartbeat and swelling in legs/feet.  Gastrointestinal: Negative for abdominal pain, blood in stool, constipation, diarrhea and vomiting.  Endocrine: Negative for increased urination.  Genitourinary: Negative for painful urination and vaginal dryness.  Musculoskeletal: Positive for arthralgias and joint pain. Negative for joint swelling, myalgias, muscle weakness, morning stiffness, muscle tenderness and myalgias.  Skin: Positive for hair loss (Triamcinolone cream is applied on the scalp twice daily helps with itchiness). Negative for color change, rash, skin tightness, ulcers and sensitivity to sunlight.  Allergic/Immunologic: Negative for susceptible to infections.  Neurological: Negative for dizziness, memory loss and night sweats.  Hematological: Negative for swollen glands.  Psychiatric/Behavioral: Negative for depressed mood and sleep disturbance. The patient is not nervous/anxious.     PMFS History:  Patient Active Problem List   Diagnosis Date Noted  . Acute pain of right shoulder 11/28/2016  . High risk medications (not anticoagulants) long-term use 07/19/2016  . Rheumatoid factor positive 07/19/2016  . SLE (systemic lupus erythematosus) ([place]) 07/18/2016  . Primary osteoarthritis of both hands 07/18/2016  . Primary osteoarthritis of both feet 07/18/2016  . Calcaneal spur of both feet 07/18/2016  . Osteoarthritis of both knees 07/18/2016  . Hypertension 07/18/2016  . Obesity 07/18/2016  . Vitamin D deficiency 07/18/2016    Past Medical History:  Diagnosis Date  . Calcaneal spur of both feet 07/18/2016  . Hypertension 07/18/2016  . Obesity 07/18/2016  . Osteoarthritis of both feet 07/18/2016  . Osteoarthritis  of both hands 07/18/2016  . 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   Marland Kitchen SLE (systemic lupus erythematosus) ([place]) 07/18/2016   Positive ANA, Positive Ro, Positive Smith, Positive RNP, Positive RF, Negative CCP,   Elevated LFTs  . Vitamin D deficiency 07/18/2016    Family History  Problem Relation Age of Onset  . Cancer Brother    Past Surgical History:  Procedure Laterality Date  . BREAST SURGERY     Social History   Social History Narrative  . No narrative on file     Objective: Vital Signs: BP (!) 155/84 (BP Location: Left Arm, Patient Position: Sitting, Cuff Size: Normal)   Pulse 70   Resp 16   Ht '5\' 4"'  (1.626 m)   Wt 192 lb (87.1 kg)   BMI 32.96 kg/m    Physical Exam  Constitutional: She is oriented to person, place, and time. She appears well-developed and well-nourished.  HENT:  Head: Normocephalic and atraumatic.  Eyes: Conjunctivae and EOM are normal. Pupils are equal, round, and reactive to light.  Neck: Normal range of motion.  Cardiovascular: Normal rate, regular rhythm, normal heart sounds and intact distal pulses.   Pulmonary/Chest: Effort normal and breath sounds normal.  Abdominal: Soft. Bowel sounds are normal.  Lymphadenopathy:    She has no cervical adenopathy.  Neurological: She is alert and oriented to person, place, and time.  Skin: Skin is warm and dry. Capillary refill takes less than 2 seconds.  Psychiatric: She has a normal mood and affect. Her behavior is normal.  Nursing note and vitals reviewed.    Musculoskeletal Exam: C-spine and thoracic, lumbar spine good range of motion. Shoulder joints elbow joints wrist joints are good range of motion. She had no synovitis or wrists joint or MCP joints. She is some thickening of bilateral DIP joints consistent with osteoarthritis. Hip joints with good range of motion with no discomfort. Her left knee joint had some warmth and swelling. Ankle joints are good range of motion.  CDAI Exam: No CDAI exam completed.    Investigation: No additional findings. Orders Only on 12/30/2016  Component Date Value Ref Range Status  . WBC 12/30/2016 4.6  3.8 - 10.8 K/uL Final  . RBC 12/30/2016 4.00  3.80 - 5.10 MIL/uL  Final  . Hemoglobin 12/30/2016 12.9  11.7 - 15.5 g/dL Final  . HCT 12/30/2016 38.5  35.0 - 45.0 % Final  . MCV 12/30/2016 96.3  80.0 - 100.0 fL Final  . MCH 12/30/2016 32.3  27.0 - 33.0 pg Final  . MCHC 12/30/2016 33.5  32.0 - 36.0 g/dL Final  . RDW 12/30/2016 13.6  11.0 - 15.0 % Final  . Platelets 12/30/2016 203  140 - 400 K/uL Final  . MPV 12/30/2016 11.0  7.5 - 12.5 fL Final  . Neutro Abs 12/30/2016 2116  1,500 - 7,800 cells/uL Final  . Lymphs Abs 12/30/2016 1518  850 - 3,900 cells/uL Final  . Monocytes Absolute 12/30/2016 874  200 - 950 cells/uL Final  . Eosinophils Absolute 12/30/2016 92  15 - 500 cells/uL Final  . Basophils Absolute 12/30/2016 0  0 - 200 cells/uL Final  . Neutrophils Relative % 12/30/2016 46  % Final  . Lymphocytes Relative 12/30/2016 33  % Final  . Monocytes Relative 12/30/2016 19  % Final  . Eosinophils Relative 12/30/2016 2  % Final  . Basophils Relative 12/30/2016 0  % Final  . Smear Review 12/30/2016 Criteria for review not met  Final  . Sodium 12/30/2016 136  135 - 146 mmol/L Final  . Potassium 12/30/2016 3.6  3.5 - 5.3 mmol/L Final  . Chloride 12/30/2016 101  98 - 110 mmol/L Final  . CO2 12/30/2016 27  20 - 31 mmol/L Final  . Glucose, Bld 12/30/2016 69  65 - 99 mg/dL Final  . BUN 12/30/2016 14  7 - 25 mg/dL Final  . Creat 12/30/2016 0.66  0.60 - 0.93 mg/dL Final   Comment:   For patients > or = 77 years of age: The upper reference limit for Creatinine is approximately 13% higher for people identified as African-American.     . Total Bilirubin 12/30/2016 0.7  0.2 - 1.2 mg/dL Final  . Alkaline Phosphatase 12/30/2016 76  33 - 130 U/L Final  . AST 12/30/2016 84* 10 - 35 U/L Final  . ALT 12/30/2016 55* 6 - 29 U/L Final  . Total Protein 12/30/2016 8.0  6.1 - 8.1 g/dL Final  . Albumin 12/30/2016 3.7  3.6 - 5.1 g/dL Final  . Calcium 12/30/2016 9.2  8.6 - 10.4 mg/dL Final  . GFR, Est African American 12/30/2016 >89  >=60 mL/min Final  . GFR, Est Non  African American 12/30/2016 86  >=60 mL/min Final  Orders Only on 12/13/2016  Component Date Value Ref Range Status  . Sodium 12/13/2016 136  135 - 146 mmol/L Final  . Potassium 12/13/2016 3.6  3.5 - 5.3 mmol/L Final  . Chloride 12/13/2016 101  98 - 110 mmol/L Final  . CO2 12/13/2016 26  20 - 31 mmol/L Final  . Glucose, Bld 12/13/2016 124* 65 - 99 mg/dL Final  . BUN 12/13/2016 14  7 - 25 mg/dL Final  . Creat 12/13/2016 0.68  0.60 - 0.93 mg/dL Final   Comment:   For patients > or = 77 years of age: The upper reference limit for Creatinine is approximately 13% higher for people identified as African-American.     . Total Bilirubin 12/13/2016 0.6  0.2 - 1.2 mg/dL Final  . Alkaline Phosphatase 12/13/2016 68  33 - 130 U/L Final  . AST 12/13/2016 121* 10 - 35 U/L Final  . ALT 12/13/2016 68* 6 - 29 U/L Final  . Total Protein 12/13/2016 7.9  6.1 - 8.1 g/dL Final  . Albumin 12/13/2016 3.8  3.6 - 5.1 g/dL Final  . Calcium 12/13/2016 9.2  8.6 - 10.4 mg/dL Final  . GFR, Est African American 12/13/2016 >89  >=60 mL/min Final  . GFR, Est Non African American 12/13/2016 85  >=60 mL/min Final  Office Visit on 11/29/2016  Component Date Value Ref Range Status  . WBC 11/29/2016 3.6* 3.8 - 10.8 K/uL Final  . RBC 11/29/2016 4.14  3.80 - 5.10 MIL/uL Final  . Hemoglobin 11/29/2016 13.6  11.7 - 15.5 g/dL Final  . HCT 11/29/2016 40.6  35.0 - 45.0 % Final  . MCV 11/29/2016 98.1  80.0 - 100.0 fL Final  . MCH 11/29/2016 32.9  27.0 - 33.0 pg Final  . MCHC 11/29/2016 33.5  32.0 - 36.0 g/dL Final  . RDW 11/29/2016 13.8  11.0 - 15.0 % Final  . Platelets 11/29/2016 198  140 - 400 K/uL Final  . MPV 11/29/2016 10.7  7.5 - 12.5 fL Final  . Neutro Abs 11/29/2016 1800  1,500 - 7,800 cells/uL Final  . Lymphs Abs 11/29/2016 1296  850 - 3,900 cells/uL Final  . Monocytes Absolute 11/29/2016 468  200 - 950 cells/uL Final  . Eosinophils Absolute  11/29/2016 36  15 - 500 cells/uL Final  . Basophils Absolute 11/29/2016 0   0 - 200 cells/uL Final  . Neutrophils Relative % 11/29/2016 50  % Final  . Lymphocytes Relative 11/29/2016 36  % Final  . Monocytes Relative 11/29/2016 13  % Final  . Eosinophils Relative 11/29/2016 1  % Final  . Basophils Relative 11/29/2016 0  % Final  . Smear Review 11/29/2016 Criteria for review not met   Final  . Sodium 11/29/2016 138  135 - 146 mmol/L Final  . Potassium 11/29/2016 3.5  3.5 - 5.3 mmol/L Final  . Chloride 11/29/2016 100  98 - 110 mmol/L Final  . CO2 11/29/2016 30  20 - 31 mmol/L Final  . Glucose, Bld 11/29/2016 84  65 - 99 mg/dL Final  . BUN 11/29/2016 9  7 - 25 mg/dL Final  . Creat 11/29/2016 0.70  0.60 - 0.93 mg/dL Final   Comment:   For patients > or = 77 years of age: The upper reference limit for Creatinine is approximately 13% higher for people identified as African-American.     . Total Bilirubin 11/29/2016 0.7  0.2 - 1.2 mg/dL Final  . Alkaline Phosphatase 11/29/2016 70  33 - 130 U/L Final  . AST 11/29/2016 127* 10 - 35 U/L Final  . ALT 11/29/2016 74* 6 - 29 U/L Final  . Total Protein 11/29/2016 8.0  6.1 - 8.1 g/dL Final  . Albumin 11/29/2016 4.0  3.6 - 5.1 g/dL Final  . Calcium 11/29/2016 9.1  8.6 - 10.4 mg/dL Final  . GFR, Est African American 11/29/2016 >89  >=60 mL/min Final  . GFR, Est Non African American 11/29/2016 84  >=60 mL/min Final  . Color, Urine 11/29/2016 YELLOW  YELLOW Final  . APPearance 11/29/2016 CLEAR  CLEAR Final  . Specific Gravity, Urine 11/29/2016 1.011  1.001 - 1.035 Final  . pH 11/29/2016 6.5  5.0 - 8.0 Final  . Glucose, UA 11/29/2016 NEGATIVE  NEGATIVE Final  . Bilirubin Urine 11/29/2016 NEGATIVE  NEGATIVE Final  . Ketones, ur 11/29/2016 NEGATIVE  NEGATIVE Final  . Hgb urine dipstick 11/29/2016 NEGATIVE  NEGATIVE Final  . Protein, ur 11/29/2016 NEGATIVE  NEGATIVE Final  . Nitrite 11/29/2016 NEGATIVE  NEGATIVE Final  . Leukocytes, UA 11/29/2016 1+* NEGATIVE Final  . WBC, UA 11/29/2016 0-5  <=5 WBC/HPF Final  . RBC / HPF  11/29/2016 NONE SEEN  <=2 RBC/HPF Final  . Squamous Epithelial / LPF 11/29/2016 6-10* <=5 HPF Final  . Bacteria, UA 11/29/2016 FEW* NONE SEEN HPF Final  . Crystals 11/29/2016 NONE SEEN  NONE SEEN HPF Final  . Casts 11/29/2016 NONE SEEN  NONE SEEN LPF Final  . Yeast 11/29/2016 NONE SEEN  NONE SEEN HPF Final    Imaging: No results found.  Speciality Comments: No specialty comments available.    Procedures:  No procedures performed Allergies: Plaquenil [hydroxychloroquine sulfate]   Assessment / Plan:     Visit Diagnoses: Other systemic lupus erythematosus with other organ involvement ([place]) - History of positive ANA, Ro, Smith, RNP, RF, skin biopsy, elevated LFTs -Clinically she is doing fine currently except for the left knee joint warmth and swelling. She was taken off methotrexate due to elevation of LFTs. I would like to repeat her labs in near future and decide if he to put her on Plaquenil. Plan: Sedimentation rate, ANA, [id number] ENA PANEL, Urinalysis, Routine w reflex microscopic  High risk medications (not anticoagulants) long-term use - Methotrexate was discontinued due to  elevated LFTs, folic acid,- Plan: COMPLETE METABOLIC PANEL WITH GFR, Glucose 6 phosphate dehydrogenase  Primary osteoarthritis of both hands: She continues to have some discomfort in her hands which is tolerable no synovitis was noted.  Primary osteoarthritis of both knees: She has chronic discomfort in her knee joints due to underlying osteoarthritis.  Chronic pain of left knee - Plan: Large Joint Injection/Arthrocentesis.  Request after informed consent was obtained left knee joint was injected with 4-1/2 mL of lidocaine and 40 mg of cortisone. She tolerated the procedure well. The procedure is described in a separate note.  Primary osteoarthritis of both feet  Calcaneal spur of both feet  Vitamin D deficiency  History of hypertension   May consider switching to Imuran or Benlysta. Check autoimmune  labs Orders: Orders Placed This Encounter  Procedures  . Large Joint Injection/Arthrocentesis  . COMPLETE METABOLIC PANEL WITH GFR  . Sedimentation rate  . ANA  . [id number] ENA PANEL  . Glucose 6 phosphate dehydrogenase  . Urinalysis, Routine w reflex microscopic   No orders of the defined types were placed in this encounter.   Face-to-face time spent with patient was 30 minutes. 50% of time was spent in counseling and coordination of care.  Follow-Up Instructions: Return in about 2 months (around 03/12/2017) for Systemic lupus.   Bo Merino, MD  Note - This record has been created using Editor, commissioning.  Chart creation errors have been sought, but may not always  have been located. Such creation errors do not reflect on  the standard of medical care.

## 2016-12-30 ENCOUNTER — Other Ambulatory Visit: Payer: Self-pay | Admitting: *Deleted

## 2016-12-30 DIAGNOSIS — Z79899 Other long term (current) drug therapy: Secondary | ICD-10-CM

## 2016-12-30 LAB — COMPLETE METABOLIC PANEL WITH GFR
ALBUMIN: 3.7 g/dL (ref 3.6–5.1)
ALT: 55 U/L — AB (ref 6–29)
AST: 84 U/L — ABNORMAL HIGH (ref 10–35)
Alkaline Phosphatase: 76 U/L (ref 33–130)
BILIRUBIN TOTAL: 0.7 mg/dL (ref 0.2–1.2)
BUN: 14 mg/dL (ref 7–25)
CALCIUM: 9.2 mg/dL (ref 8.6–10.4)
CO2: 27 mmol/L (ref 20–31)
CREATININE: 0.66 mg/dL (ref 0.60–0.93)
Chloride: 101 mmol/L (ref 98–110)
GFR, Est Non African American: 86 mL/min (ref >=60)
Glucose, Bld: 69 mg/dL (ref 65–99)
Potassium: 3.6 mmol/L (ref 3.5–5.3)
Sodium: 136 mmol/L (ref 135–146)
TOTAL PROTEIN: 8 g/dL (ref 6.1–8.1)

## 2016-12-30 LAB — CBC WITH DIFFERENTIAL/PLATELET
BASOS PCT: 0 %
Basophils Absolute: 0 cells/uL (ref 0–200)
EOS ABS: 92 {cells}/uL (ref 15–500)
Eosinophils Relative: 2 %
HEMATOCRIT: 38.5 % (ref 35.0–45.0)
HEMOGLOBIN: 12.9 g/dL (ref 11.7–15.5)
LYMPHS ABS: 1518 {cells}/uL (ref 850–3900)
Lymphocytes Relative: 33 %
MCH: 32.3 pg (ref 27.0–33.0)
MCHC: 33.5 g/dL (ref 32.0–36.0)
MCV: 96.3 fL (ref 80.0–100.0)
MONO ABS: 874 {cells}/uL (ref 200–950)
MPV: 11 fL (ref 7.5–12.5)
Monocytes Relative: 19 %
NEUTROS ABS: 2116 {cells}/uL (ref 1500–7800)
Neutrophils Relative %: 46 %
PLATELETS: 203 10*3/uL (ref 140–400)
RBC: 4 MIL/uL (ref 3.80–5.10)
RDW: 13.6 % (ref 11.0–15.0)
WBC: 4.6 10*3/uL (ref 3.8–10.8)

## 2017-01-03 ENCOUNTER — Other Ambulatory Visit: Payer: Self-pay | Admitting: Rheumatology

## 2017-01-03 NOTE — Telephone Encounter (Signed)
Last Visit: 11/28/16 Next Visit: 01/10/17  Okay to refill Folic Acid?

## 2017-01-03 NOTE — Telephone Encounter (Signed)
ok 

## 2017-01-10 ENCOUNTER — Encounter: Payer: Self-pay | Admitting: Rheumatology

## 2017-01-10 ENCOUNTER — Ambulatory Visit (INDEPENDENT_AMBULATORY_CARE_PROVIDER_SITE_OTHER): Payer: Medicare HMO | Admitting: Rheumatology

## 2017-01-10 VITALS — BP 155/84 | HR 70 | Resp 16 | Ht 64.0 in | Wt 192.0 lb

## 2017-01-10 DIAGNOSIS — Z8679 Personal history of other diseases of the circulatory system: Secondary | ICD-10-CM

## 2017-01-10 DIAGNOSIS — G8929 Other chronic pain: Secondary | ICD-10-CM | POA: Diagnosis not present

## 2017-01-10 DIAGNOSIS — Z79899 Other long term (current) drug therapy: Secondary | ICD-10-CM

## 2017-01-10 DIAGNOSIS — M19071 Primary osteoarthritis, right ankle and foot: Secondary | ICD-10-CM

## 2017-01-10 DIAGNOSIS — M3219 Other organ or system involvement in systemic lupus erythematosus: Secondary | ICD-10-CM | POA: Diagnosis not present

## 2017-01-10 DIAGNOSIS — M7731 Calcaneal spur, right foot: Secondary | ICD-10-CM

## 2017-01-10 DIAGNOSIS — M25562 Pain in left knee: Secondary | ICD-10-CM | POA: Diagnosis not present

## 2017-01-10 DIAGNOSIS — M170 Bilateral primary osteoarthritis of knee: Secondary | ICD-10-CM

## 2017-01-10 DIAGNOSIS — E559 Vitamin D deficiency, unspecified: Secondary | ICD-10-CM

## 2017-01-10 DIAGNOSIS — M19042 Primary osteoarthritis, left hand: Secondary | ICD-10-CM

## 2017-01-10 DIAGNOSIS — M19072 Primary osteoarthritis, left ankle and foot: Secondary | ICD-10-CM | POA: Diagnosis not present

## 2017-01-10 DIAGNOSIS — M19041 Primary osteoarthritis, right hand: Secondary | ICD-10-CM | POA: Diagnosis not present

## 2017-01-10 DIAGNOSIS — M7732 Calcaneal spur, left foot: Secondary | ICD-10-CM

## 2017-01-10 MED ORDER — TRIAMCINOLONE ACETONIDE 40 MG/ML IJ SUSP
40.0000 mg | INTRAMUSCULAR | Status: AC | PRN
  Administered 2017-01-10: 40 mg via INTRA_ARTICULAR

## 2017-01-10 MED ORDER — LIDOCAINE HCL 1 % IJ SOLN
1.5000 mL | INTRAMUSCULAR | Status: AC | PRN
  Administered 2017-01-10: 1.5 mL

## 2017-01-10 NOTE — Patient Instructions (Signed)
Standing Labs We placed an order today for your standing lab work.    Please come back and get your standing labs in June  We have open lab Monday through Friday from 8:30-11:30 AM and 1:30-4 PM at the office of Dr. Tresa Moore, PA.   The office is located at [address], [place], [place], [place] [address] No appointment is necessary.   Labs are drawn by [facility].  You may receive a bill from [facility] for your lab work.

## 2017-01-10 NOTE — Progress Notes (Signed)
   Procedure Note  Patient: Deanna Hart             Date of Birth: 1940/11/05           MRN: [id number]             Visit Date: 01/10/2017  Large Joint Inj Date/Time: 01/10/2017 11:37 AM Performed by: Bo Merino Authorized by: Bo Merino   Consent Given by:  Patient Site marked: the procedure site was marked   Timeout: prior to procedure the correct patient, procedure, and site was verified   Indications:  Pain and joint swelling Location:  Knee Site:  L knee Prep: patient was prepped and draped in usual sterile fashion   Needle Size:  27 G Needle Length:  1.5 inches Approach:  Medial Ultrasound Guidance: No   Fluoroscopic Guidance: No   Arthrogram: No   Medications:  1.5 mL lidocaine 1 %; 40 mg triamcinolone acetonide 40 MG/ML Aspiration Attempted: Yes   Aspirate amount (mL):  0 Patient tolerance:  Patient tolerated the procedure well with no immediate complications

## 2017-01-12 ENCOUNTER — Ambulatory Visit
Admission: RE | Admit: 2017-01-12 | Discharge: 2017-01-12 | Disposition: A | Discharge status: Home / Self Care | Payer: Medicare HMO | Source: Ambulatory Visit | Attending: Family | Admitting: Family

## 2017-01-12 DIAGNOSIS — N6320 Unspecified lump in the left breast, unspecified quadrant: Secondary | ICD-10-CM

## 2017-02-20 ENCOUNTER — Other Ambulatory Visit: Payer: Self-pay

## 2017-02-20 DIAGNOSIS — Z79899 Other long term (current) drug therapy: Secondary | ICD-10-CM

## 2017-02-20 DIAGNOSIS — M3219 Other organ or system involvement in systemic lupus erythematosus: Secondary | ICD-10-CM

## 2017-02-20 LAB — CBC WITH DIFFERENTIAL/PLATELET
Basophils Absolute: 0 cells/uL (ref 0–200)
Basophils Relative: 0 %
EOS PCT: 1 %
Eosinophils Absolute: 43 cells/uL (ref 15–500)
HCT: 40.7 % (ref 35.0–45.0)
Hemoglobin: 13.4 g/dL (ref 11.7–15.5)
Lymphocytes Relative: 36 %
Lymphs Abs: 1548 cells/uL (ref 850–3900)
MCH: 31.5 pg (ref 27.0–33.0)
MCHC: 32.9 g/dL (ref 32.0–36.0)
MCV: 95.8 fL (ref 80.0–100.0)
MONOS PCT: 11 %
MPV: 10.6 fL (ref 7.5–12.5)
Monocytes Absolute: 473 cells/uL (ref 200–950)
NEUTROS PCT: 52 %
Neutro Abs: 2236 cells/uL (ref 1500–7800)
PLATELETS: 201 10*3/uL (ref 140–400)
RBC: 4.25 MIL/uL (ref 3.80–5.10)
RDW: 14.2 % (ref 11.0–15.0)
WBC: 4.3 10*3/uL (ref 3.8–10.8)

## 2017-02-21 LAB — URINALYSIS, ROUTINE W REFLEX MICROSCOPIC
BILIRUBIN URINE: NEGATIVE
GLUCOSE, UA: NEGATIVE
HGB URINE DIPSTICK: NEGATIVE
Ketones, ur: NEGATIVE
Nitrite: NEGATIVE
PROTEIN: NEGATIVE
Specific Gravity, Urine: 1.014 (ref 1.001–1.035)
pH: 7 (ref 5.0–8.0)

## 2017-02-21 LAB — COMPLETE METABOLIC PANEL WITH GFR
ALT: 63 U/L — AB (ref 6–29)
AST: 85 U/L — AB (ref 10–35)
Albumin: 3.7 g/dL (ref 3.6–5.1)
Alkaline Phosphatase: 88 U/L (ref 33–130)
BUN: 15 mg/dL (ref 7–25)
CHLORIDE: 104 mmol/L (ref 98–110)
CO2: 24 mmol/L (ref 20–31)
CREATININE: 0.64 mg/dL (ref 0.60–0.93)
Calcium: 8.9 mg/dL (ref 8.6–10.4)
GFR, Est African American: >89 mL/min (ref >=60)
GFR, Est Non African American: 86 mL/min (ref >=60)
GLUCOSE: 104 mg/dL — AB (ref 65–99)
Potassium: 3.7 mmol/L (ref 3.5–5.3)
Sodium: 140 mmol/L (ref 135–146)
Total Bilirubin: 0.7 mg/dL (ref 0.2–1.2)
Total Protein: 7.8 g/dL (ref 6.1–8.1)

## 2017-02-21 LAB — CP5000020 ENA PANEL
ENA SM AB SER-ACNC: NEGATIVE
Ribonucleic Protein(ENA) Antibody, IgG: <1
SSA (RO) (ENA) ANTIBODY, IGG: POSITIVE — AB
SSB (LA) (ENA) ANTIBODY, IGG: NEGATIVE
Scleroderma (Scl-70) (ENA) Antibody, IgG: <1
ds DNA Ab: 1 IU/mL

## 2017-02-21 LAB — URINALYSIS, MICROSCOPIC ONLY
CASTS: NONE SEEN [LPF]
Crystals: NONE SEEN [HPF]
YEAST: NONE SEEN [HPF]

## 2017-02-21 LAB — ANA: Anti Nuclear Antibody(ANA): POSITIVE — AB

## 2017-02-21 LAB — SEDIMENTATION RATE: Sed Rate: 28 mm/hr (ref 0–30)

## 2017-02-21 LAB — ANTI-NUCLEAR AB-TITER (ANA TITER): ANA Titer 1: 1:160 {titer} — ABNORMAL HIGH

## 2017-02-23 LAB — GLUCOSE 6 PHOSPHATE DEHYDROGENASE: G-6PDH: 14.3 U/g{Hb} (ref 7.0–20.5)

## 2017-02-23 NOTE — Progress Notes (Signed)
stable °

## 2017-03-03 DIAGNOSIS — R945 Abnormal results of liver function studies: Secondary | ICD-10-CM

## 2017-03-03 DIAGNOSIS — R7989 Other specified abnormal findings of blood chemistry: Secondary | ICD-10-CM | POA: Insufficient documentation

## 2017-03-03 NOTE — Progress Notes (Signed)
Office Visit Note  Patient: Deanna Hart             Date of Birth: 06-09-1941           MRN: [id number]             PCP: [name], MD Referring: [name], MD Visit Date: 03/07/2017 Occupation: _0 @    Subjective:  Joint stiffness.   History of Present Illness: Deanna Hart is a 76 y.o. female with history of systemic lupus erythematosus. She states she's been doing quite well . She came off methotrexate due to elevation of liver functions. She denies any joint swelling or joint stiffness currently. She states her left knee joint responded very well to cortisone injection. She's not having much trouble with her hands and feet. She denies any rash.  Activities of Daily Living:  Patient reports morning stiffness for 0 minute.   Patient Denies nocturnal pain.  Difficulty dressing/grooming: Denies Difficulty climbing stairs: Denies Difficulty getting out of chair: Denies Difficulty using hands for taps, buttons, cutlery, and/or writing: Denies   Review of Systems  Constitutional: Negative for fatigue, night sweats, weight gain, weight loss and weakness.  HENT: Negative for mouth sores, trouble swallowing, trouble swallowing, mouth dryness and nose dryness.   Eyes: Negative for pain, redness, visual disturbance and dryness.  Respiratory: Negative for cough, shortness of breath and difficulty breathing.   Cardiovascular: Negative for chest pain, palpitations, hypertension, irregular heartbeat and swelling in legs/feet.  Gastrointestinal: Negative for blood in stool, constipation and diarrhea.  Endocrine: Negative for increased urination.  Genitourinary: Negative for vaginal dryness.  Musculoskeletal: Negative for arthralgias, joint pain, joint swelling, myalgias, muscle weakness, morning stiffness, muscle tenderness and myalgias.  Skin: Negative for color change, rash, hair loss, skin tightness, ulcers and sensitivity to sunlight.    Allergic/Immunologic: Negative for susceptible to infections.  Neurological: Negative for dizziness, memory loss and night sweats.  Hematological: Negative for swollen glands.  Psychiatric/Behavioral: Negative for depressed mood and sleep disturbance. The patient is not nervous/anxious.     PMFS History:  Patient Active Problem List   Diagnosis Date Noted  . Elevated LFTs 03/03/2017  . Acute pain of right shoulder 11/28/2016  . High risk medications (not anticoagulants) long-term use 07/19/2016  . Rheumatoid factor positive 07/19/2016  . SLE (systemic lupus erythematosus) ([place]) 07/18/2016  . Primary osteoarthritis of both hands 07/18/2016  . Primary osteoarthritis of both feet 07/18/2016  . Calcaneal spur of both feet 07/18/2016  . Osteoarthritis of both knees 07/18/2016  . Hypertension 07/18/2016  . Obesity 07/18/2016  . Vitamin D deficiency 07/18/2016    Past Medical History:  Diagnosis Date  . Calcaneal spur of both feet 07/18/2016  . Hypertension 07/18/2016  . Obesity 07/18/2016  . Osteoarthritis of both feet 07/18/2016  . Osteoarthritis of both hands 07/18/2016  . 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   Marland Kitchen SLE (systemic lupus erythematosus) ([place]) 07/18/2016   Positive ANA, Positive Ro, Positive Smith, Positive RNP, Positive RF, Negative CCP,  Elevated LFTs  . Vitamin D deficiency 07/18/2016    Family History  Problem Relation Age of Onset  . Cancer Brother    Past Surgical History:  Procedure Laterality Date  . BREAST SURGERY     Social History   Social History Narrative  . No narrative on file     Objective: Vital Signs: BP 140/86 (BP Location: Right Arm, Patient Position: Sitting, Cuff Size: Small)  Pulse 68   Resp 14   Ht 5' 4" (1.626 m)   Wt 195 lb (88.5 kg)   BMI 33.47 kg/m    Physical Exam  Constitutional: She is oriented to person, place, and time. She appears well-developed and well-nourished.  HENT:   Head: Normocephalic and atraumatic.  Eyes: Conjunctivae and EOM are normal.  Neck: Normal range of motion.  Cardiovascular: Normal rate, regular rhythm, normal heart sounds and intact distal pulses.   Pulmonary/Chest: Effort normal and breath sounds normal.  Abdominal: Soft. Bowel sounds are normal.  Lymphadenopathy:    She has no cervical adenopathy.  Neurological: She is alert and oriented to person, place, and time.  Skin: Skin is warm and dry. Capillary refill takes less than 2 seconds.  Psychiatric: She has a normal mood and affect. Her behavior is normal.  Nursing note and vitals reviewed.    Musculoskeletal Exam: C-spine and thoracic lumbar spine good range of motion. Shoulder joints elbow joints wrist joint MCPs PIPs DIPs with good range of motion. She does have DIP thickening bilaterally in her hands consistent with osteoarthritis. Hip joints knee joints ankle joints MTPs PIPs with good range of motion. She does have DIP changes in her feet consistent with osteoarthritis. Some warmth was noted on her left knee joint on palpation.  CDAI Exam: No CDAI exam completed.    Investigation: Findings:  Systemic lupus erythematosus with positive ANA, positive Ro, positive Smith and RNP antibody.  She also has positive rheumatoid factor.  She has also had chronic elevation of LFTs and skin biopsy positive for lupus.  She had intolerance to Plaquenil.    CBC Latest Ref Rng & Units 02/20/2017 12/30/2016 11/29/2016  WBC 3.8 - 10.8 K/uL 4.3 4.6 3.6(L)  Hemoglobin 11.7 - 15.5 g/dL 13.4 12.9 13.6  Hematocrit 35.0 - 45.0 % 40.7 38.5 40.6  Platelets 140 - 400 K/uL 201 203 198   CMP Latest Ref Rng & Units 02/20/2017 12/30/2016 12/13/2016  Glucose 65 - 99 mg/dL 104(H) 69 124(H)  BUN 7 - 25 mg/dL 15 14 14  Creatinine 0.60 - 0.93 mg/dL 0.64 0.66 0.68  Sodium 135 - 146 mmol/L 140 136 136  Potassium 3.5 - 5.3 mmol/L 3.7 3.6 3.6  Chloride 98 - 110 mmol/L 104 101 101  CO2 20 - 31 mmol/L 24 27 26   Calcium 8.6 - 10.4 mg/dL 8.9 9.2 9.2  Total Protein 6.1 - 8.1 g/dL 7.8 8.0 7.9  Total Bilirubin 0.2 - 1.2 mg/dL 0.7 0.7 0.6  Alkaline Phos 33 - 130 U/L 88 76 68  AST 10 - 35 U/L 85(H) 84(H) 121(H)  ALT 6 - 29 U/L 63(H) 55(H) 68(H)   UA 3+ leukocytes moderate bacteria, ANA 1:160, positive SS a(SSB, DS DNA, RNP, SCL 70, Smith negative) ESR 28 normal G6PD normal  Imaging: No results found.  Speciality Comments: No specialty comments available.    Procedures:  No procedures performed Allergies: Plaquenil [hydroxychloroquine sulfate]   Assessment / Plan:     Visit Diagnoses: Other systemic lupus erythematosus with other organ involvement (HCC) - +ANA +Ro +Smith +RNP + RF neg CCP / h/o skin bipsy Dr Jones + biopsy. She's been off methotrexate and clinically doing well. She still continues to have some warmth in her left knee joint.  High risk medication use - MTX was discontinued due to elevated LFTs, will consider imuran or Benlysta in future after LFTs normalize. Her most recent LFTs did not go down from her previous values. I   will repeat her LFTs again in 1 month. - Plan: Thiopurine methyltransferase(tpmt)rbc, Serum protein electrophoresis with reflex,  Elevated LFTs - Plan: Hepatic function panel, hepatitis B and hepatitis C screening. If her repeat LFTs are still elevated I may consider sending her for GI consult.  Medical non-compliance/ with labs   Primary osteoarthritis of both hands: She continues to have some stiffness and pain  Primary osteoarthritis of both knees: She has some warmth in her left knee joint but not much discomfort since the cortisone injection.  Primary osteoarthritis of both feet: Proper fitting shoes were discussed.  Calcaneal spur of both feet  Vitamin D deficiency  History of hypertension    Orders: Orders Placed This Encounter  Procedures  . Hepatic function panel  . Thiopurine methyltransferase(tpmt)rbc  . Serum protein electrophoresis with  reflex  . Hepatic function panel  . Thiopurine methyltransferase(tpmt)rbc  . Serum protein electrophoresis with reflex  . Hepatitis B core antibody, IgM  . Hepatitis B surface antigen  . Hepatitis C antibody   No orders of the defined types were placed in this encounter.   Face-to-face time spent with patient was 30 minutes. 50% of time was spent in counseling and coordination of care.  Follow-Up Instructions: Return in about 2 months (around 05/07/2017) for Systemic lupus, Osteoarthritis.   Bo Merino, MD  Note - This record has been created using Editor, commissioning.  Chart creation errors have been sought, but may not always  have been located. Such creation errors do not reflect on  the standard of medical care.

## 2017-03-07 ENCOUNTER — Encounter: Payer: Self-pay | Admitting: Rheumatology

## 2017-03-07 ENCOUNTER — Ambulatory Visit (INDEPENDENT_AMBULATORY_CARE_PROVIDER_SITE_OTHER): Payer: Medicare HMO | Admitting: Rheumatology

## 2017-03-07 ENCOUNTER — Encounter (INDEPENDENT_AMBULATORY_CARE_PROVIDER_SITE_OTHER): Payer: Self-pay

## 2017-03-07 VITALS — BP 140/86 | HR 68 | Resp 14 | Ht 64.0 in | Wt 195.0 lb

## 2017-03-07 DIAGNOSIS — R7989 Other specified abnormal findings of blood chemistry: Secondary | ICD-10-CM | POA: Diagnosis not present

## 2017-03-07 DIAGNOSIS — M7731 Calcaneal spur, right foot: Secondary | ICD-10-CM

## 2017-03-07 DIAGNOSIS — Z9119 Patient's noncompliance with other medical treatment and regimen: Secondary | ICD-10-CM

## 2017-03-07 DIAGNOSIS — M19072 Primary osteoarthritis, left ankle and foot: Secondary | ICD-10-CM | POA: Diagnosis not present

## 2017-03-07 DIAGNOSIS — M19041 Primary osteoarthritis, right hand: Secondary | ICD-10-CM | POA: Diagnosis not present

## 2017-03-07 DIAGNOSIS — M19071 Primary osteoarthritis, right ankle and foot: Secondary | ICD-10-CM

## 2017-03-07 DIAGNOSIS — R945 Abnormal results of liver function studies: Secondary | ICD-10-CM

## 2017-03-07 DIAGNOSIS — E559 Vitamin D deficiency, unspecified: Secondary | ICD-10-CM | POA: Diagnosis not present

## 2017-03-07 DIAGNOSIS — M7732 Calcaneal spur, left foot: Secondary | ICD-10-CM

## 2017-03-07 DIAGNOSIS — M19042 Primary osteoarthritis, left hand: Secondary | ICD-10-CM | POA: Diagnosis not present

## 2017-03-07 DIAGNOSIS — M3219 Other organ or system involvement in systemic lupus erythematosus: Secondary | ICD-10-CM

## 2017-03-07 DIAGNOSIS — Z8679 Personal history of other diseases of the circulatory system: Secondary | ICD-10-CM

## 2017-03-07 DIAGNOSIS — Z79899 Other long term (current) drug therapy: Secondary | ICD-10-CM | POA: Diagnosis not present

## 2017-03-07 DIAGNOSIS — M170 Bilateral primary osteoarthritis of knee: Secondary | ICD-10-CM | POA: Diagnosis not present

## 2017-03-07 DIAGNOSIS — Z91199 Patient's noncompliance with other medical treatment and regimen due to unspecified reason: Secondary | ICD-10-CM

## 2017-03-07 NOTE — Patient Instructions (Signed)
Azathioprine tablets What is this medicine? AZATHIOPRINE (ay za THYE oh preen) suppresses the immune system. It is used to prevent organ rejection after a transplant. It is also used to treat rheumatoid arthritis. This medicine may be used for other purposes; ask your health care provider or pharmacist if you have questions. COMMON BRAND NAME(S): Azasan, Imuran What should I tell my health care provider before I take this medicine? They need to know if you have any of these conditions: -infection -kidney disease -liver disease -an unusual or allergic reaction to azathioprine, other medicines, lactose, foods, dyes, or preservatives -pregnant or trying to get pregnant -breast feeding How should I use this medicine? Take this medicine by mouth with a full glass of water. Follow the directions on the prescription label. Take your medicine at regular intervals. Do not take your medicine more often than directed. Continue to take your medicine even if you feel better. Do not stop taking except on your doctor's advice. Talk to your pediatrician regarding the use of this medicine in children. Special care may be needed. Overdosage: If you think you have taken too much of this medicine contact a poison control center or emergency room at once. NOTE: This medicine is only for you. Do not share this medicine with others. What if I miss a dose? If you miss a dose, take it as soon as you can. If it is almost time for your next dose, take only that dose. Do not take double or extra doses. What may interact with this medicine? Do not take this medicine with any of the following medications: -febuxostat -mercaptopurine This medicine may also interact with the following medications: -allopurinol -aminosalicylates like sulfasalazine, mesalamine, balsalazide, and olsalazine -leflunomide -medicines called ACE inhibitors like benazepril, captopril, enalapril, fosinopril, quinapril, lisinopril, ramipril, and  trandolapril -mycophenolate -sulfamethoxazole; trimethoprim -vaccines -warfarin This list may not describe all possible interactions. Give your health care provider a list of all the medicines, herbs, non-prescription drugs, or dietary supplements you use. Also tell them if you smoke, drink alcohol, or use illegal drugs. Some items may interact with your medicine. What should I watch for while using this medicine? Visit your doctor or health care professional for regular checks on your progress. You will need frequent blood checks during the first few months you are receiving the medicine. If you get a cold or other infection while receiving this medicine, call your doctor or health care professional. Do not treat yourself. The medicine may increase your risk of getting an infection. Women should inform their doctor if they wish to become pregnant or think they might be pregnant. There is a potential for serious side effects to an unborn child. Talk to your health care professional or pharmacist for more information. Men may have a reduced sperm count while they are taking this medicine. Talk to your health care professional for more information. This medicine may increase your risk of getting certain kinds of cancer. Talk to your doctor about healthy lifestyle choices, important screenings, and your risk. What side effects may I notice from receiving this medicine? Side effects that you should report to your doctor or health care professional as soon as possible: -allergic reactions like skin rash, itching or hives, swelling of the face, lips, or tongue -changes in vision -confusion -fever, chills, or any other sign of infection -loss of balance or coordination -severe stomach pain -unusual bleeding, bruising -unusually weak or tired -vomiting -yellowing of the eyes or skin Side effects that usually do   not require medical attention (report to your doctor or health care professional if they  continue or are bothersome): -hair loss -nausea This list may not describe all possible side effects. Call your doctor for medical advice about side effects. You may report side effects to FDA at 1-800-FDA-1088. Where should I keep my medicine? Keep out of the reach of children. Store at room temperature between 15 and 25 degrees C (59 and 77 degrees F). Protect from light. Throw away any unused medicine after the expiration date. NOTE: This sheet is a summary. It may not cover all possible information. If you have questions about this medicine, talk to your doctor, pharmacist, or health care provider.  2018 Elsevier/Gold Standard (2013-12-31 12:00:31) eturn in 1 month for your lab work.

## 2017-04-02 ENCOUNTER — Other Ambulatory Visit: Payer: Self-pay | Admitting: Rheumatology

## 2017-04-03 NOTE — Telephone Encounter (Signed)
Last Visit: 03/07/17 Next Visit: 05/02/17  Okay to refill per Dr. Estanislado Pandy

## 2017-04-21 ENCOUNTER — Other Ambulatory Visit: Payer: Self-pay

## 2017-04-21 DIAGNOSIS — Z79899 Other long term (current) drug therapy: Secondary | ICD-10-CM

## 2017-04-21 DIAGNOSIS — R7989 Other specified abnormal findings of blood chemistry: Secondary | ICD-10-CM

## 2017-04-21 DIAGNOSIS — R945 Abnormal results of liver function studies: Principal | ICD-10-CM

## 2017-04-21 LAB — CBC WITH DIFFERENTIAL/PLATELET
BASOS ABS: 0 {cells}/uL (ref 0–200)
Basophils Relative: 0 %
EOS PCT: 1 %
Eosinophils Absolute: 36 cells/uL (ref 15–500)
HEMATOCRIT: 38.9 % (ref 35.0–45.0)
HEMOGLOBIN: 13.2 g/dL (ref 11.7–15.5)
LYMPHS ABS: 1188 {cells}/uL (ref 850–3900)
LYMPHS PCT: 33 %
MCH: 32.7 pg (ref 27.0–33.0)
MCHC: 33.9 g/dL (ref 32.0–36.0)
MCV: 96.3 fL (ref 80.0–100.0)
MONO ABS: 396 {cells}/uL (ref 200–950)
MPV: 11.2 fL (ref 7.5–12.5)
Monocytes Relative: 11 %
NEUTROS PCT: 55 %
Neutro Abs: 1980 cells/uL (ref 1500–7800)
Platelets: 202 10*3/uL (ref 140–400)
RBC: 4.04 MIL/uL (ref 3.80–5.10)
RDW: 14 % (ref 11.0–15.0)
WBC: 3.6 10*3/uL — ABNORMAL LOW (ref 3.8–10.8)

## 2017-04-22 LAB — HEPATIC FUNCTION PANEL
ALBUMIN: 3.7 g/dL (ref 3.6–5.1)
ALT: 60 U/L — ABNORMAL HIGH (ref 6–29)
AST: 86 U/L — ABNORMAL HIGH (ref 10–35)
Alkaline Phosphatase: 69 U/L (ref 33–130)
BILIRUBIN DIRECT: 0.1 mg/dL (ref <=0.2)
BILIRUBIN TOTAL: 0.6 mg/dL (ref 0.2–1.2)
Indirect Bilirubin: 0.5 mg/dL (ref 0.2–1.2)
Total Protein: 7.5 g/dL (ref 6.1–8.1)

## 2017-04-22 LAB — COMPLETE METABOLIC PANEL WITH GFR
ALT: 60 U/L — ABNORMAL HIGH (ref 6–29)
AST: 86 U/L — AB (ref 10–35)
Albumin: 3.7 g/dL (ref 3.6–5.1)
Alkaline Phosphatase: 71 U/L (ref 33–130)
BUN: 11 mg/dL (ref 7–25)
CHLORIDE: 101 mmol/L (ref 98–110)
CO2: 30 mmol/L (ref 20–31)
Calcium: 9.3 mg/dL (ref 8.6–10.4)
Creat: 0.67 mg/dL (ref 0.60–0.93)
GFR, EST NON AFRICAN AMERICAN: 85 mL/min (ref >=60)
GFR, Est African American: >89 mL/min (ref >=60)
GLUCOSE: 127 mg/dL — AB (ref 65–99)
POTASSIUM: 3.8 mmol/L (ref 3.5–5.3)
Sodium: 139 mmol/L (ref 135–146)
Total Bilirubin: 0.6 mg/dL (ref 0.2–1.2)
Total Protein: 7.5 g/dL (ref 6.1–8.1)

## 2017-04-22 LAB — HEPATITIS B SURFACE ANTIGEN: HEP B S AG: NONREACTIVE

## 2017-04-22 LAB — HEPATITIS C ANTIBODY: HCV AB: NONREACTIVE

## 2017-04-22 LAB — HEPATITIS B CORE ANTIBODY, IGM: HEP B C IGM: NONREACTIVE

## 2017-04-24 NOTE — Progress Notes (Signed)
LFTs still elevated but stable. Awaiting results of Hep panel and TPMT

## 2017-04-25 ENCOUNTER — Telehealth: Payer: Self-pay | Admitting: Rheumatology

## 2017-04-25 NOTE — Telephone Encounter (Signed)
Patient advised of lab results and verbalized understanding.  

## 2017-04-25 NOTE — Telephone Encounter (Signed)
Patient left a message wanting to get lab results. Please call to advise.

## 2017-04-26 NOTE — Progress Notes (Signed)
Office Visit Note  Patient: Deanna Hart             Date of Birth: Jan 27, 1941           MRN: [id number]             PCP: [name], MD Referring: [name], MD Visit Date: 05/02/2017 Occupation: @GUAROCC @    Subjective:  Left knee joint pain and swelling .   History of Present Illness: DARCUS EDDS is a 76 y.o. female with history of systemic lupus or dermatosis. She was tried on Plaquenil but could not tolerate it. Then she was started on methotrexate which caused nausea, vomiting and dizziness. She also had elevation of LFTs. We're to discontinue methotrexate. The plan was to start her on Imuran or Benlysta. Her TPM T is not back yet. She continues to have pain and discomfort in her multiple joints especially her left knee joint. The scalp rash is okay.  Activities of Daily Living:  Patient reports morning stiffness for 30 minutes.   Patient Denies nocturnal pain.  Difficulty dressing/grooming: Denies Difficulty climbing stairs: Reports Difficulty getting out of chair: Reports Difficulty using hands for taps, buttons, cutlery, and/or writing: Denies   Review of Systems  Constitutional: Positive for fatigue. Negative for night sweats, weight gain, weight loss and weakness.  HENT: Negative for mouth sores, trouble swallowing, trouble swallowing, mouth dryness and nose dryness.   Eyes: Negative for pain, redness, visual disturbance and dryness.  Respiratory: Negative for cough, shortness of breath and difficulty breathing.   Cardiovascular: Negative for chest pain, palpitations, hypertension, irregular heartbeat and swelling in legs/feet.  Gastrointestinal: Negative for blood in stool, constipation and diarrhea.  Endocrine: Negative for increased urination.  Genitourinary: Negative for vaginal dryness.  Musculoskeletal: Positive for arthralgias, joint pain, joint swelling and morning stiffness. Negative for myalgias, muscle weakness, muscle tenderness  and myalgias.  Skin: Negative for color change, rash, hair loss, skin tightness, ulcers and sensitivity to sunlight.  Allergic/Immunologic: Negative for susceptible to infections.  Neurological: Negative for dizziness, memory loss and night sweats.  Hematological: Negative for swollen glands.  Psychiatric/Behavioral: Negative for depressed mood and sleep disturbance. The patient is not nervous/anxious.     PMFS History:  Patient Active Problem List   Diagnosis Date Noted  . Elevated LFTs 03/03/2017  . Acute pain of right shoulder 11/28/2016  . High risk medications (not anticoagulants) long-term use 07/19/2016  . Rheumatoid factor positive 07/19/2016  . SLE (systemic lupus erythematosus) ([place]) 07/18/2016  . Primary osteoarthritis of both hands 07/18/2016  . Primary osteoarthritis of both feet 07/18/2016  . Calcaneal spur of both feet 07/18/2016  . Osteoarthritis of both knees 07/18/2016  . Hypertension 07/18/2016  . Obesity 07/18/2016  . Vitamin D deficiency 07/18/2016    Past Medical History:  Diagnosis Date  . Calcaneal spur of both feet 07/18/2016  . Hypertension 07/18/2016  . Obesity 07/18/2016  . Osteoarthritis of both feet 07/18/2016  . Osteoarthritis of both hands 07/18/2016  . 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   Marland Kitchen SLE (systemic lupus erythematosus) ([place]) 07/18/2016   Positive ANA, Positive Ro, Positive Smith, Positive RNP, Positive RF, Negative CCP,  Elevated LFTs  . Vitamin D deficiency 07/18/2016    Family History  Problem Relation Age of Onset  . Cancer Brother    Past Surgical History:  Procedure Laterality Date  . BREAST SURGERY     Social History   Social History Narrative  .  No narrative on file     Objective: Vital Signs: BP (!) 144/86 (BP Location: Left Arm, Patient Position: Sitting, Cuff Size: Normal)   Pulse 71   Ht 5\' 4"  (1.626 m)   Wt 195 lb (88.5 kg)   BMI 33.47 kg/m    Physical Exam    Constitutional: She is oriented to person, place, and time. She appears well-developed and well-nourished.  HENT:  Head: Normocephalic and atraumatic.  Eyes: Conjunctivae and EOM are normal.  Neck: Normal range of motion.  Cardiovascular: Normal rate, regular rhythm, normal heart sounds and intact distal pulses.   Pulmonary/Chest: Effort normal and breath sounds normal.  Abdominal: Soft. Bowel sounds are normal.  Lymphadenopathy:    She has no cervical adenopathy.  Neurological: She is alert and oriented to person, place, and time.  Skin: Skin is warm and dry. Capillary refill takes less than 2 seconds.  Psychiatric: She has a normal mood and affect. Her behavior is normal.  Nursing note and vitals reviewed.    Musculoskeletal Exam: C-spine and thoracic lumbar spine good range of motion. Shoulder joints elbow joints wrist joints are good range of motion. She has no synovitis or MCP joints. She has DIP PIP thickening in her hands consistent with osteoarthritis. She has good range of motion of her hip joints and knee joints. She has some warmth and swelling in her left knee joint. Over the joints afford range of motion with no synovitis.  CDAI Exam: No CDAI exam completed.    Investigation: No additional findings.  CBC Latest Ref Rng & Units 04/21/2017 02/20/2017 12/30/2016  WBC 3.8 - 10.8 K/uL 3.6(L) 4.3 4.6  Hemoglobin 11.7 - 15.5 g/dL 13.2 13.4 12.9  Hematocrit 35.0 - 45.0 % 38.9 40.7 38.5  Platelets 140 - 400 K/uL 202 201 203   CMP Latest Ref Rng & Units 04/21/2017 02/20/2017 12/30/2016  Glucose 65 - 99 mg/dL 127(H) 104(H) 69  BUN 7 - 25 mg/dL 11 15 14   Creatinine 0.60 - 0.93 mg/dL 0.67 0.64 0.66  Sodium 135 - 146 mmol/L 139 140 136  Potassium 3.5 - 5.3 mmol/L 3.8 3.7 3.6  Chloride 98 - 110 mmol/L 101 104 101  CO2 20 - 31 mmol/L 30 24 27   Calcium 8.6 - 10.4 mg/dL 9.3 8.9 9.2  Total Protein 6.1 - 8.1 g/dL 7.5 7.8 8.0  Total Bilirubin 0.2 - 1.2 mg/dL 0.6 0.7 0.7  Alkaline Phos 33 -  130 U/L 71 88 76  AST 10 - 35 U/L 86(H) 85(H) 84(H)  ALT 6 - 29 U/L 60(H) 63(H) 55(H)  TPMTPending, SPEP Abnormal, IFE IgG lambda monoclonal protein positive, hepatitis panel negative Imaging: No results found.  Speciality Comments: No specialty comments available.    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 She continues to have some swelling in her left knee joint which could be related to osteoarthritis. She states she has not had any recent skin rash. As she is not having any symptoms right now we can hold off treatment.   High risk medications long-term use - Methotrexate was discontinued due to nausea vomiting and fatigue. The records review show that she had nausea and dizziness with Plaquenil use in 2016.  Elevated LFTs: She has chronic elevation of liver functions. Even  prior to starting on Plaquenil. Will refer her to GI for evaluation of elevated LFTs.  Primary osteoarthritis of both hands she has some discomfort.   Primary osteoarthritis of both knees: She has chronic pain in bilateral knee joints more so in the left knee.   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 I will refer her to hematologist.   Orders: Orders Placed This Encounter  Procedures  . Ambulatory referral to Gastroenterology  . Ambulatory referral to Hematology   No orders of the defined types were placed in this encounter.   Face-to-face time spent with patient was 30 minutes. 50% of time was spent in counseling and coordination of care.  Follow-Up Instructions: Return in about 3 months (around 08/02/2017) for Systemic lupus, Osteoarthritis.   Bo Merino, MD  Note - This record has been created using Editor, commissioning.  Chart creation errors have been sought, but may not always  have been located. Such creation errors do not reflect on  the standard of medical care.

## 2017-04-27 LAB — PROTEIN ELECTROPHORESIS, SERUM, WITH REFLEX
ABNORMAL PROTEIN BAND1: 0.9 g/dL — AB
ALPHA-1-GLOBULIN: 0.3 g/dL (ref 0.2–0.3)
ALPHA-2-GLOBULIN: 0.7 g/dL (ref 0.5–0.9)
Albumin ELP: 3.8 g/dL (ref 3.8–4.8)
Beta 2: 0.5 g/dL (ref 0.2–0.5)
Beta Globulin: 0.5 g/dL (ref 0.4–0.6)
Gamma Globulin: 2 g/dL — ABNORMAL HIGH (ref 0.8–1.7)
TOTAL PROTEIN, SERUM ELECTROPHOR: 7.7 g/dL (ref 6.1–8.1)

## 2017-04-27 LAB — IFE INTERPRETATION: Immunofix Electr Int: DETECTED

## 2017-05-01 ENCOUNTER — Ambulatory Visit: Payer: Commercial Managed Care - HMO | Admitting: Rheumatology

## 2017-05-02 ENCOUNTER — Ambulatory Visit: Payer: Commercial Managed Care - HMO | Admitting: Rheumatology

## 2017-05-02 ENCOUNTER — Encounter: Payer: Self-pay | Admitting: Rheumatology

## 2017-05-02 ENCOUNTER — Ambulatory Visit (INDEPENDENT_AMBULATORY_CARE_PROVIDER_SITE_OTHER): Payer: Medicare HMO | Admitting: Rheumatology

## 2017-05-02 VITALS — BP 144/86 | HR 71 | Ht 64.0 in | Wt 195.0 lb

## 2017-05-02 DIAGNOSIS — M19041 Primary osteoarthritis, right hand: Secondary | ICD-10-CM | POA: Diagnosis not present

## 2017-05-02 DIAGNOSIS — R945 Abnormal results of liver function studies: Secondary | ICD-10-CM

## 2017-05-02 DIAGNOSIS — D472 Monoclonal gammopathy: Secondary | ICD-10-CM

## 2017-05-02 DIAGNOSIS — Z79899 Other long term (current) drug therapy: Secondary | ICD-10-CM | POA: Diagnosis not present

## 2017-05-02 DIAGNOSIS — M19071 Primary osteoarthritis, right ankle and foot: Secondary | ICD-10-CM

## 2017-05-02 DIAGNOSIS — M19072 Primary osteoarthritis, left ankle and foot: Secondary | ICD-10-CM

## 2017-05-02 DIAGNOSIS — R7989 Other specified abnormal findings of blood chemistry: Secondary | ICD-10-CM | POA: Diagnosis not present

## 2017-05-02 DIAGNOSIS — E559 Vitamin D deficiency, unspecified: Secondary | ICD-10-CM | POA: Diagnosis not present

## 2017-05-02 DIAGNOSIS — M3219 Other organ or system involvement in systemic lupus erythematosus: Secondary | ICD-10-CM

## 2017-05-02 DIAGNOSIS — M170 Bilateral primary osteoarthritis of knee: Secondary | ICD-10-CM

## 2017-05-02 DIAGNOSIS — M7732 Calcaneal spur, left foot: Secondary | ICD-10-CM | POA: Diagnosis not present

## 2017-05-02 DIAGNOSIS — M7731 Calcaneal spur, right foot: Secondary | ICD-10-CM

## 2017-05-02 DIAGNOSIS — M19042 Primary osteoarthritis, left hand: Secondary | ICD-10-CM

## 2017-05-02 DIAGNOSIS — Z8679 Personal history of other diseases of the circulatory system: Secondary | ICD-10-CM

## 2017-05-03 LAB — THIOPURINE METHYLTRANSFERASE (TPMT), RBC: THIOPURINE METHYLTRANSFERASE, RBC: 15 nmol/h/mL

## 2017-05-03 NOTE — Progress Notes (Signed)
I will hold off Imuran for right now. She was not symptomatic during the last visit. She is scheduled to see GI and hematology for elevated LFTs and abnormal IFE respectively.

## 2017-05-04 ENCOUNTER — Telehealth: Payer: Self-pay | Admitting: Radiology

## 2017-05-04 NOTE — Telephone Encounter (Signed)
I have called pt to advise, she has voiced understanding. She will follow up after she has seen GI and Hematologist . She will call us if she has any problems.

## 2017-05-04 NOTE — Telephone Encounter (Signed)
-----   Message from Bo Merino, MD sent at 05/03/2017  4:25 PM EDT ----- I will hold off Imuran for right now. She was not symptomatic during the last visit. She is scheduled to see GI and hematology for elevated LFTs and abnormal IFE respectively.

## 2017-05-11 ENCOUNTER — Telehealth: Payer: Self-pay | Admitting: Hematology and Oncology

## 2017-05-11 ENCOUNTER — Encounter: Payer: Self-pay | Admitting: Hematology and Oncology

## 2017-05-11 NOTE — Telephone Encounter (Signed)
Appt has been scheduled for the pt to see Dr. Lebron Conners on 9/11 at 11am. Pt aware to arrive 15 minutes early. Address and insurance verified.

## 2017-05-30 ENCOUNTER — Encounter: Payer: Self-pay | Admitting: Hematology and Oncology

## 2017-05-30 ENCOUNTER — Ambulatory Visit (HOSPITAL_BASED_OUTPATIENT_CLINIC_OR_DEPARTMENT_OTHER): Payer: Medicare HMO

## 2017-05-30 ENCOUNTER — Telehealth: Payer: Self-pay | Admitting: Hematology and Oncology

## 2017-05-30 ENCOUNTER — Telehealth: Payer: Self-pay

## 2017-05-30 ENCOUNTER — Ambulatory Visit (HOSPITAL_BASED_OUTPATIENT_CLINIC_OR_DEPARTMENT_OTHER): Payer: Medicare HMO | Admitting: Hematology and Oncology

## 2017-05-30 VITALS — BP 136/75 | HR 70 | Temp 97.4°F | Resp 17 | Ht 64.0 in | Wt 197.2 lb

## 2017-05-30 DIAGNOSIS — D472 Monoclonal gammopathy: Secondary | ICD-10-CM | POA: Diagnosis not present

## 2017-05-30 DIAGNOSIS — E790 Hyperuricemia without signs of inflammatory arthritis and tophaceous disease: Secondary | ICD-10-CM

## 2017-05-30 LAB — CBC & DIFF AND RETIC
BASO%: 0.3 % (ref 0.0–2.0)
BASOS ABS: 0 10*3/uL (ref 0.0–0.1)
EOS ABS: 0 10*3/uL (ref 0.0–0.5)
EOS%: 0.8 % (ref 0.0–7.0)
HEMATOCRIT: 38.9 % (ref 34.8–46.6)
HEMOGLOBIN: 13.1 g/dL (ref 11.6–15.9)
Immature Retic Fract: 6.5 % (ref 1.60–10.00)
LYMPH%: 34.2 % (ref 14.0–49.7)
MCH: 32.5 pg (ref 25.1–34.0)
MCHC: 33.7 g/dL (ref 31.5–36.0)
MCV: 96.5 fL (ref 79.5–101.0)
MONO#: 0.5 10*3/uL (ref 0.1–0.9)
MONO%: 12.8 % (ref 0.0–14.0)
NEUT#: 1.9 10*3/uL (ref 1.5–6.5)
NEUT%: 51.9 % (ref 38.4–76.8)
PLATELETS: 178 10*3/uL (ref 145–400)
RBC: 4.03 10*6/uL (ref 3.70–5.45)
RDW: 14.1 % (ref 11.2–14.5)
RETIC %: 1.73 % (ref 0.70–2.10)
Retic Ct Abs: 69.72 10*3/uL (ref 33.70–90.70)
WBC: 3.7 10*3/uL — ABNORMAL LOW (ref 3.9–10.3)
lymph#: 1.3 10*3/uL (ref 0.9–3.3)

## 2017-05-30 LAB — COMPREHENSIVE METABOLIC PANEL
ALBUMIN: 3.6 g/dL (ref 3.5–5.0)
ALK PHOS: 82 U/L (ref 40–150)
ALT: 106 U/L — ABNORMAL HIGH (ref 0–55)
AST: 171 U/L (ref 5–34)
Anion Gap: 7 mEq/L (ref 3–11)
BILIRUBIN TOTAL: 0.77 mg/dL (ref 0.20–1.20)
BUN: 13.2 mg/dL (ref 7.0–26.0)
CALCIUM: 9.4 mg/dL (ref 8.4–10.4)
CO2: 29 mEq/L (ref 22–29)
Chloride: 101 mEq/L (ref 98–109)
Creatinine: 0.8 mg/dL (ref 0.6–1.1)
EGFR: 85 mL/min/{1.73_m2} — AB (ref >90)
GLUCOSE: 96 mg/dL (ref 70–140)
POTASSIUM: 3.3 meq/L — AB (ref 3.5–5.1)
SODIUM: 137 meq/L (ref 136–145)
TOTAL PROTEIN: 8.5 g/dL — AB (ref 6.4–8.3)

## 2017-05-30 LAB — LACTATE DEHYDROGENASE: LDH: 269 U/L — ABNORMAL HIGH (ref 125–245)

## 2017-05-30 LAB — URIC ACID: URIC ACID, SERUM: 8.1 mg/dL — AB (ref 2.6–7.4)

## 2017-05-30 MED ORDER — ALLOPURINOL 300 MG PO TABS: 300.0000 mg | ORAL_TABLET | Freq: Every day | ORAL | 3 refills | Status: DC

## 2017-05-30 NOTE — Telephone Encounter (Signed)
Gave patient avs report and appointments September.

## 2017-05-30 NOTE — Telephone Encounter (Signed)
Left VM for pt to p/u allopurinol 300mg  daily at [facility] on [address]. Pt uric acid elevated and MD recommends she start taking allopurinol daily upon receipt of medication.

## 2017-05-31 LAB — KAPPA/LAMBDA LIGHT CHAINS
Ig Kappa Free Light Chain: 34.9 mg/L — ABNORMAL HIGH (ref 3.3–19.4)
Ig Lambda Free Light Chain: 33.2 mg/L — ABNORMAL HIGH (ref 5.7–26.3)
KAPPA/LAMBDA FLC RATIO: 1.05 (ref 0.26–1.65)

## 2017-05-31 LAB — BETA 2 MICROGLOBULIN, SERUM: Beta-2: 2.1 mg/L (ref 0.6–2.4)

## 2017-06-01 DIAGNOSIS — D472 Monoclonal gammopathy: Secondary | ICD-10-CM | POA: Insufficient documentation

## 2017-06-01 DIAGNOSIS — E790 Hyperuricemia without signs of inflammatory arthritis and tophaceous disease: Secondary | ICD-10-CM | POA: Insufficient documentation

## 2017-06-01 LAB — MULTIPLE MYELOMA PANEL, SERUM
ALBUMIN/GLOB SERPL: 0.9 (ref 0.7–1.7)
ALPHA 1: 0.2 g/dL (ref 0.0–0.4)
Albumin SerPl Elph-Mcnc: 3.6 g/dL (ref 2.9–4.4)
Alpha2 Glob SerPl Elph-Mcnc: 0.7 g/dL (ref 0.4–1.0)
B-Globulin SerPl Elph-Mcnc: 1.2 g/dL (ref 0.7–1.3)
GAMMA GLOB SERPL ELPH-MCNC: 2.3 g/dL — AB (ref 0.4–1.8)
GLOBULIN, TOTAL: 4.4 g/dL — AB (ref 2.2–3.9)
IGA/IMMUNOGLOBULIN A, SERUM: 316 mg/dL (ref 64–422)
IgG, Qn, Serum: 2292 mg/dL — ABNORMAL HIGH (ref 700–1600)
IgM, Qn, Serum: 28 mg/dL (ref 26–217)
M Protein SerPl Elph-Mcnc: 0.6 g/dL — ABNORMAL HIGH
Total Protein: 8 g/dL (ref 6.0–8.5)

## 2017-06-01 NOTE — Progress Notes (Signed)
[name] New Visit:  Assessment: MGUS (monoclonal gammopathy of unknown significance) 76 year old female with active systemic lupus currently not on therapy referred for possible presence of monoclonal gammopathy in the peripheral blood.  Patient has no symptoms of pain in the long bones. She does have chronic back pain that has not changed its character. Laboratory demonstrates no evidence of anemia or thrombocytopenia. No significant renal abnormality or hypercalcemia. View of the previous labs demonstrates a small M spike in the IgG lambda range. This may represent a small monoclonal protein that may be due to presence of an autoimmune disorder which is remarkably seropositive, or presence of secondary hematological malignancy such as developing multiple myeloma.  Plan: --Repeat labs as outlined below --Skeletal survey --Return to clinic in 2 weeks to review the findings   Voice recognition software was used and creation of this note. Despite my best effort at editing the text, some misspelling/errors may have occurred.  Orders Placed This Encounter  Procedures  . DG Bone Survey Met    Standing Status:   Future    Standing Expiration Date:   07/30/2018    Order Specific Question:   Reason for Exam (SYMPTOM  OR DIAGNOSIS REQUIRED)    Answer:   MGUS, pelase eval for skeletal lytic lesions    Order Specific Question:   Preferred imaging location?    Answer:   [facility]    Order Specific Question:   Radiology Contrast Protocol - do NOT remove file path    Answer:   [email or web address]  . CBC & Diff and Retic    Standing Status:   Future    Number of Occurrences:   1    Standing Expiration Date:   05/30/2018  . Lactate dehydrogenase (LDH)    Standing Status:   Future    Number of Occurrences:   1    Standing Expiration Date:   05/30/2018  . Comprehensive metabolic panel    Standing Status:   Future    Number of  Occurrences:   1    Standing Expiration Date:   05/30/2018  . Uric acid    Standing Status:   Future    Number of Occurrences:   1    Standing Expiration Date:   05/30/2018  . Beta 2 microglobulin, serum    Standing Status:   Future    Number of Occurrences:   1    Standing Expiration Date:   05/30/2018  . Multiple Myeloma Panel (SPEP&IFE w/QIG)    Standing Status:   Future    Number of Occurrences:   1    Standing Expiration Date:   05/30/2018  . 24-Hr Ur UPEP/UIFE/Light Chains/TP    Standing Status:   Future    Number of Occurrences:   1    Standing Expiration Date:   05/30/2018  . Kappa/lambda light chains    Standing Status:   Future    Number of Occurrences:   1    Standing Expiration Date:   05/30/2018    All questions were answered.  . The patient knows to call the clinic with any problems, questions or concerns.  This note was electronically signed.    History of Presenting Illness Deanna Hart is a 76 y.o. female presenting to the [place] for Evaluation of monoclonal gammopathy. Patient has a diagnosis of systemic lupus erythematosus and positive rheumatoid factor and was undergoing completion of evaluation for her rheumatologist when laboratory abnormality was discovered.  Before meals oncological/hematological history below for details. It appears that patient has been tried on Plaquenil followed by methotrexate which she has been unable to tolerate. The current rheumatological plan is to be treated with Imuran or Benlysta.  At the present time, patient denies any active new complaints. She does have extensive musculoskeletal complaints, but denies any respiratory, gastrointestinal, or neurological complaints..  Oncological/hematological History: --Labs, 04/21/17: tProt 7.5, Alb 3.7, Ca 9.3, Cr 0.67, AP 71, AST 86, ALT 60, tBili 0.6; SPEP -- M-spike 0.9g/dL, SIFE -- positive for IgG lambda presence; WBC 3.6, Nl diff, Hgb 13.2, Plt 202; HCV -- negative  Medical  History: Past Medical History:  Diagnosis Date  . Calcaneal spur of both feet 07/18/2016  . Hypertension 07/18/2016  . Obesity 07/18/2016  . Osteoarthritis of both feet 07/18/2016  . Osteoarthritis of both hands 07/18/2016  . 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   Marland Kitchen SLE (systemic lupus erythematosus) ([place]) 07/18/2016   Positive ANA, Positive Ro, Positive Smith, Positive RNP, Positive RF, Negative CCP,  Elevated LFTs  . Vitamin D deficiency 07/18/2016    Surgical History: Past Surgical History:  Procedure Laterality Date  . BREAST SURGERY      Family History: Family History  Problem Relation Age of Onset  . Cancer Brother   . Cancer Maternal Uncle     Social History: Social History   Social History  . Marital status: Widowed    Spouse name: N/A  . Number of children: N/A  . Years of education: N/A   Occupational History  . Not on file.   Social History Main Topics  . Smoking status: Former Smoker    Years: 3.00    Types: Cigarettes  . Smokeless tobacco: Former Systems developer    Types: Chew  . Alcohol use No  . Drug use: No  . Sexual activity: Not on file   Other Topics Concern  . Not on file   Social History Narrative  . No narrative on file    Allergies: Allergies  Allergen Reactions  . Plaquenil [Hydroxychloroquine Sulfate] Nausea Only    Medications:  Current Outpatient Prescriptions  Medication Sig Dispense Refill  . allopurinol (ZYLOPRIM) 300 MG tablet Take 1 tablet (300 mg total) by mouth daily. 30 tablet 3  . amLODipine (NORVASC) 5 MG tablet     . folic acid (FOLVITE) 1 MG tablet TAKE 1 TABLET BY MOUTH EVERY DAY 90 tablet 0  . methotrexate (RHEUMATREX) 2.5 MG tablet Take 3 tablets (7.5 mg total) by mouth once a week. (Patient not taking: Reported on 05/02/2017) 36 tablet 0  . traMADol (ULTRAM) 50 MG tablet     . triamcinolone cream (KENALOG) 0.1 % APP AA BID  2  . triamterene-hydrochlorothiazide (MAXZIDE-25)  37.5-25 MG tablet      No current facility-administered medications for this visit.     Review of Systems: Review of Systems  All other systems reviewed and are negative.    PHYSICAL EXAMINATION Blood pressure 136/75, pulse 70, temperature (!) 97.4 F (36.3 C), temperature source Oral, resp. rate 17, height _0  (1.626 m), weight 197 lb 3.2 oz (89.4 kg), SpO2 100 %.  ECOG PERFORMANCE STATUS: 2 - Symptomatic, <50% confined to bed  Physical Exam  Constitutional: She is oriented to person, place, and time and well-developed, well-nourished, and in no distress. No distress.  HENT:  Head: Normocephalic and atraumatic.  Mouth/Throat: No oropharyngeal exudate.  Eyes: Pupils are equal, round, and reactive  to light. Conjunctivae and EOM are normal. No scleral icterus.  Neck: Normal range of motion. Neck supple. No thyromegaly present.  Cardiovascular: Normal rate, regular rhythm and normal heart sounds.  Exam reveals no gallop.   No murmur heard. Pulmonary/Chest: Effort normal and breath sounds normal. She has no wheezes.  Abdominal: Soft. Bowel sounds are normal. She exhibits no distension and no mass. There is no tenderness. There is no rebound and no guarding.  Musculoskeletal: She exhibits tenderness. She exhibits no edema or deformity.  Lymphadenopathy:    She has no cervical adenopathy.  Neurological: She is alert and oriented to person, place, and time. She has normal reflexes.  Skin: Skin is warm and dry. She is not diaphoretic. No erythema.     LABORATORY DATA: I have personally reviewed the data as listed: Appointment on 05/30/2017  Component Date Value Ref Range Status  . WBC 05/30/2017 3.7* 3.9 - 10.3 10e3/uL Final  . NEUT# 05/30/2017 1.9  1.5 - 6.5 10e3/uL Final  . HGB 05/30/2017 13.1  11.6 - 15.9 g/dL Final  . HCT 05/30/2017 38.9  34.8 - 46.6 % Final  . Platelets 05/30/2017 178  145 - 400 10e3/uL Final  . MCV 05/30/2017 96.5  79.5 - 101.0 fL Final  . MCH 05/30/2017  32.5  25.1 - 34.0 pg Final  . MCHC 05/30/2017 33.7  31.5 - 36.0 g/dL Final  . RBC 05/30/2017 4.03  3.70 - 5.45 10e6/uL Final  . RDW 05/30/2017 14.1  11.2 - 14.5 % Final  . lymph# 05/30/2017 1.3  0.9 - 3.3 10e3/uL Final  . MONO# 05/30/2017 0.5  0.1 - 0.9 10e3/uL Final  . Eosinophils Absolute 05/30/2017 0.0  0.0 - 0.5 10e3/uL Final  . Basophils Absolute 05/30/2017 0.0  0.0 - 0.1 10e3/uL Final  . NEUT% 05/30/2017 51.9  38.4 - 76.8 % Final  . LYMPH% 05/30/2017 34.2  14.0 - 49.7 % Final  . MONO% 05/30/2017 12.8  0.0 - 14.0 % Final  . EOS% 05/30/2017 0.8  0.0 - 7.0 % Final  . BASO% 05/30/2017 0.3  0.0 - 2.0 % Final  . Retic % 05/30/2017 1.73  0.70 - 2.10 % Final  . Retic Ct Abs 05/30/2017 69.72  33.70 - 90.70 10e3/uL Final  . Immature Retic Fract 05/30/2017 6.50  1.60 - 10.00 % Final  . LDH 05/30/2017 269* 125 - 245 U/L Final  . Sodium 05/30/2017 137  136 - 145 mEq/L Final  . Potassium 05/30/2017 3.3* 3.5 - 5.1 mEq/L Final  . Chloride 05/30/2017 101  98 - 109 mEq/L Final  . CO2 05/30/2017 29  22 - 29 mEq/L Final  . Glucose 05/30/2017 96  70 - 140 mg/dl Final   Glucose reference range is for nonfasting patients. Fasting glucose reference range is 70- 100.  Marland Kitchen BUN 05/30/2017 13.2  7.0 - 26.0 mg/dL Final  . Creatinine 05/30/2017 0.8  0.6 - 1.1 mg/dL Final  . Total Bilirubin 05/30/2017 0.77  0.20 - 1.20 mg/dL Final  . Alkaline Phosphatase 05/30/2017 82  40 - 150 U/L Final  . AST 05/30/2017 171 Repeated and Verified* 5 - 34 U/L Final  . ALT 05/30/2017 106* 0 - 55 U/L Final  . Total Protein 05/30/2017 8.5* 6.4 - 8.3 g/dL Final  . Albumin 05/30/2017 3.6  3.5 - 5.0 g/dL Final  . Calcium 05/30/2017 9.4  8.4 - 10.4 mg/dL Final  . Anion Gap 05/30/2017 7  3 - 11 mEq/L Final  . EGFR 05/30/2017 85* >90  ml/min/1.73 m2 Final   eGFR is calculated using the CKD-EPI Creatinine Equation (2009)  . Uric Acid, Serum 05/30/2017 8.1* 2.6 - 7.4 mg/dl Final  . Beta-2 05/30/2017 2.1  0.6 - 2.4 mg/L Final   [employer]ens  Immulite 2000 Immunochemiluminometric assay (ICMA)  . IgG, Qn, Serum 05/30/2017 2,292* 700 - 1600 mg/dL Final  . IgA, Qn, Serum 05/30/2017 316  64 - 422 mg/dL Final  . IgM, Qn, Serum 05/30/2017 28  26 - 217 mg/dL Final  . Total Protein 05/30/2017 8.0  6.0 - 8.5 g/dL Final  . Albumin SerPl Elph-Mcnc 05/30/2017 3.6  2.9 - 4.4 g/dL Final  . Alpha 1 05/30/2017 0.2  0.0 - 0.4 g/dL Final  . Alpha2 Glob SerPl Elph-Mcnc 05/30/2017 0.7  0.4 - 1.0 g/dL Final  . B-Globulin SerPl Elph-Mcnc 05/30/2017 1.2  0.7 - 1.3 g/dL Final  . Gamma Glob SerPl Elph-Mcnc 05/30/2017 2.3* 0.4 - 1.8 g/dL Final  . M Protein SerPl Elph-Mcnc 05/30/2017 0.6* Not Observed g/dL Final  . Globulin, Total 05/30/2017 4.4* 2.2 - 3.9 g/dL Final  . Albumin/Glob SerPl 05/30/2017 0.9  0.7 - 1.7 Final  . IFE 1 05/30/2017 Comment   Final   Comment: Immunofixation shows IgG monoclonal protein with lambda light chain specificity. An apparent polyclonal gammopathy: IgG. Kappa and lambda typing appear increased.   . Please Note 05/30/2017 Comment   Final   Comment: Protein electrophoresis scan will follow via computer, mail, or courier delivery.   Loletha Grayer Kappa Free Light Chain 05/30/2017 34.9* 3.3 - 19.4 mg/L Final  . Ig Lambda Free Light Chain 05/30/2017 33.2* 5.7 - 26.3 mg/L Final  . Kappa/Lambda FluidC Ratio 05/30/2017 1.05  0.26 - 1.65 Final       Ardath Sax, MD

## 2017-06-01 NOTE — Assessment & Plan Note (Signed)
76 year old female with active systemic lupus currently not on therapy referred for possible presence of monoclonal gammopathy in the peripheral blood.  Patient has no symptoms of pain in the long bones. She does have chronic back pain that has not changed its character. Laboratory demonstrates no evidence of anemia or thrombocytopenia. No significant renal abnormality or hypercalcemia. View of the previous labs demonstrates a small M spike in the IgG lambda range. This may represent a small monoclonal protein that may be due to presence of an autoimmune disorder which is remarkably seropositive, or presence of secondary hematological malignancy such as developing multiple myeloma.  Plan: --Repeat labs as outlined below --Skeletal survey --Return to clinic in 2 weeks to review the findings

## 2017-06-02 LAB — UPEP/UIFE/LIGHT CHAINS/TP, 24-HR UR
% BETA, Urine: 20.8 %
ALBUMIN, U: 25 %
ALPHA 1 URINE: 3 %
ALPHA-2-GLOBULIN, U: 11.4 %
FREE KAPPA LT CHAINS, UR: 58.1 mg/L — AB (ref 1.35–24.19)
FREE LAMBDA LT CHAINS, UR: 5.78 mg/L (ref 0.24–6.66)
GAMMA GLOBULIN URINE: 39.8 %
KAPPA/LAMBDA RATIO, U: 10.05 (ref 2.04–10.37)
M-SPIKE, %: 9.3 % — AB
M-SPIKE, MG/24 HR: 18 mg/(24.h) — AB
PROTEIN UR: 10.8 mg/dL
Prot,24hr calculated: 189 mg/24 hr — ABNORMAL HIGH (ref 30–150)

## 2017-06-13 ENCOUNTER — Telehealth: Payer: Self-pay | Admitting: Hematology and Oncology

## 2017-06-13 ENCOUNTER — Encounter: Payer: Self-pay | Admitting: Hematology and Oncology

## 2017-06-13 ENCOUNTER — Ambulatory Visit (HOSPITAL_BASED_OUTPATIENT_CLINIC_OR_DEPARTMENT_OTHER): Payer: Medicare HMO | Admitting: Hematology and Oncology

## 2017-06-13 VITALS — BP 155/82 | HR 77 | Temp 99.0°F | Resp 18 | Ht 64.0 in | Wt 199.6 lb

## 2017-06-13 DIAGNOSIS — D472 Monoclonal gammopathy: Secondary | ICD-10-CM | POA: Diagnosis not present

## 2017-06-13 DIAGNOSIS — E790 Hyperuricemia without signs of inflammatory arthritis and tophaceous disease: Secondary | ICD-10-CM | POA: Diagnosis not present

## 2017-06-13 DIAGNOSIS — M329 Systemic lupus erythematosus, unspecified: Secondary | ICD-10-CM | POA: Diagnosis not present

## 2017-06-13 NOTE — Telephone Encounter (Signed)
Scheduled appt per 9/25 los - Gave patient AVS and calender per los.  

## 2017-06-17 ENCOUNTER — Other Ambulatory Visit: Payer: Self-pay | Admitting: Hematology and Oncology

## 2017-06-17 DIAGNOSIS — D472 Monoclonal gammopathy: Secondary | ICD-10-CM

## 2017-06-17 NOTE — Progress Notes (Signed)
[name] Follow-up Visit:  Assessment: MGUS (monoclonal gammopathy of unknown significance) 76 year old female with active systemic lupus currently not on therapy referred for possible presence of monoclonal gammopathy in the peripheral blood. Our findings confirm the MGUS. Unfortunately, the patient has not had her skeletal survey yet. At this time, she does not meet any other of the CRAB criteria to suspect an active multiple myeloma.   Of note, she does have an elevated uric acid level as well as progressive transaminitis of unclear etiology.   Plan: --Skeletal survey --Rpt labs in 2 weeks for LFTs  --RTC 3 months with full myeloma panel to monitor for progression unless lytic lesions demonstrated on the Skeletal Survey. In that case, will bring in for a BM Bx sooner.   Orders Placed This Encounter  Procedures  . Hepatic function panel    Standing Status:   Future    Standing Expiration Date:   06/13/2018  . Uric acid    Standing Status:   Future    Standing Expiration Date:   06/13/2018  . CBC with Differential    Standing Status:   Future    Standing Expiration Date:   06/13/2018  . Comprehensive metabolic panel    Standing Status:   Future    Standing Expiration Date:   06/13/2018  . Uric acid    Standing Status:   Future    Standing Expiration Date:   06/13/2018  . Multiple Myeloma Panel (SPEP&IFE w/QIG)    Standing Status:   Future    Standing Expiration Date:   06/13/2018  . Kappa/lambda light chains    Standing Status:   Future    Standing Expiration Date:   06/13/2018  . Beta 2 microglobulin    Standing Status:   Future    Standing Expiration Date:   06/13/2018  . Lactate dehydrogenase (LDH)    Standing Status:   Future    Standing Expiration Date:   06/13/2018    Cancer Staging No matching staging information was found for the patient.  All questions were answered.  . The patient knows to call the clinic with any problems, questions or  concerns.  This note was electronically signed.    History of Presenting Illness Deanna Hart is a 76 y.o. female followed in the [facility] for monoclonal gammopathy. Patient has a diagnosis of systemic lupus erythematosus and positive rheumatoid factor and was undergoing completion of evaluation for her rheumatologist when laboratory abnormality was discovered. Before meals oncological/hematological history below for details. It appears that patient has been tried on Plaquenil followed by methotrexate which she has been unable to tolerate. The current rheumatological plan is to be treated with Imuran or Benlysta.  At the present time, patient denies any active new complaints. She does have extensive musculoskeletal complaints, but denies any respiratory, gastrointestinal, or neurological complaints. Patient returns to the clinic to review the findings.  Oncological/hematological History: --Labs, 04/21/17: tProt 7.5, Alb 3.7, Ca 9.3, Cr 0.7, AP 71, AST   86, ALT   60, tBili 0.6; SPEP -- M-spike 0.9g/dL, SIFE -- positive for IgG lambda presence; WBC 3.6, Nl diff, Hgb 13.2, Plt 202; HCV -- negative --Labs, 05/30/17: tProt 8.5, Alb 3.6, Ca 9.4, Cr 0.8, AP 82, AST 171, ALT 106, tBili 0.8; SPEP -- M-Spike 0.6g/dL, SIFE -- positive for IgG lambda presence; IgG 2292, IgA 316, IgM 28; kappa 34.9, lambda 33.2, KLR 1.05; UPEP -- M-Spike 39m/24hr; Uric acid 8.1;   Oncological/hematological History:  No history exists.    Medical History: Past Medical History:  Diagnosis Date  . Calcaneal spur of both feet 07/18/2016  . Hypertension 07/18/2016  . Obesity 07/18/2016  . Osteoarthritis of both feet 07/18/2016  . Osteoarthritis of both hands 07/18/2016  . 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   Marland Kitchen SLE (systemic lupus erythematosus) ([place]) 07/18/2016   Positive ANA, Positive Ro, Positive Smith, Positive RNP, Positive RF, Negative CCP,  Elevated LFTs  .  Vitamin D deficiency 07/18/2016    Surgical History: Past Surgical History:  Procedure Laterality Date  . BREAST SURGERY      Family History: Family History  Problem Relation Age of Onset  . Cancer Brother   . Cancer Maternal Uncle     Social History: Social History   Social History  . Marital status: Widowed    Spouse name: N/A  . Number of children: N/A  . Years of education: N/A   Occupational History  . Not on file.   Social History Main Topics  . Smoking status: Former Smoker    Years: 3.00    Types: Cigarettes  . Smokeless tobacco: Former Systems developer    Types: Chew  . Alcohol use No  . Drug use: No  . Sexual activity: Not on file   Other Topics Concern  . Not on file   Social History Narrative  . No narrative on file    Allergies: Allergies  Allergen Reactions  . Plaquenil [Hydroxychloroquine Sulfate] Nausea Only    Medications:  Current Outpatient Prescriptions  Medication Sig Dispense Refill  . allopurinol (ZYLOPRIM) 300 MG tablet Take 1 tablet (300 mg total) by mouth daily. 30 tablet 3  . amLODipine (NORVASC) 5 MG tablet     . folic acid (FOLVITE) 1 MG tablet TAKE 1 TABLET BY MOUTH EVERY DAY 90 tablet 0  . methotrexate (RHEUMATREX) 2.5 MG tablet Take 3 tablets (7.5 mg total) by mouth once a week. (Patient not taking: Reported on 05/02/2017) 36 tablet 0  . traMADol (ULTRAM) 50 MG tablet     . triamcinolone cream (KENALOG) 0.1 % APP AA BID  2  . triamterene-hydrochlorothiazide (MAXZIDE-25) 37.5-25 MG tablet      No current facility-administered medications for this visit.     Review of Systems: Review of Systems  All other systems reviewed and are negative.    PHYSICAL EXAMINATION Blood pressure (!) 155/82, pulse 77, temperature 99 F (37.2 C), temperature source Oral, resp. rate 18, height '5\' 4"'  (1.626 m), weight 199 lb 9.6 oz (90.5 kg), SpO2 97 %.  ECOG PERFORMANCE STATUS: 1 - Symptomatic but completely ambulatory  Physical Exam   Constitutional: She is oriented to person, place, and time and well-developed, well-nourished, and in no distress. No distress.  HENT:  Head: Normocephalic and atraumatic.  Mouth/Throat: No oropharyngeal exudate.  Eyes: Pupils are equal, round, and reactive to light. Conjunctivae and EOM are normal. No scleral icterus.  Neck: Normal range of motion. Neck supple. No thyromegaly present.  Cardiovascular: Normal rate, regular rhythm and normal heart sounds.  Exam reveals no gallop.   No murmur heard. Pulmonary/Chest: Effort normal and breath sounds normal. She has no wheezes.  Abdominal: Soft. Bowel sounds are normal. She exhibits no distension and no mass. There is no tenderness. There is no rebound and no guarding.  Musculoskeletal: She exhibits tenderness. She exhibits no edema or deformity.  Lymphadenopathy:    She has no cervical adenopathy.  Neurological: She is  alert and oriented to person, place, and time. She has normal reflexes.  Skin: Skin is warm and dry. She is not diaphoretic. No erythema.     LABORATORY DATA: I have personally reviewed the data as listed: No visits with results within 1 Week(s) from this visit.  Latest known visit with results is:  Appointment on 05/30/2017  Component Date Value Ref Range Status  . WBC 05/30/2017 3.7* 3.9 - 10.3 10e3/uL Final  . NEUT# 05/30/2017 1.9  1.5 - 6.5 10e3/uL Final  . HGB 05/30/2017 13.1  11.6 - 15.9 g/dL Final  . HCT 05/30/2017 38.9  34.8 - 46.6 % Final  . Platelets 05/30/2017 178  145 - 400 10e3/uL Final  . MCV 05/30/2017 96.5  79.5 - 101.0 fL Final  . MCH 05/30/2017 32.5  25.1 - 34.0 pg Final  . MCHC 05/30/2017 33.7  31.5 - 36.0 g/dL Final  . RBC 05/30/2017 4.03  3.70 - 5.45 10e6/uL Final  . RDW 05/30/2017 14.1  11.2 - 14.5 % Final  . lymph# 05/30/2017 1.3  0.9 - 3.3 10e3/uL Final  . MONO# 05/30/2017 0.5  0.1 - 0.9 10e3/uL Final  . Eosinophils Absolute 05/30/2017 0.0  0.0 - 0.5 10e3/uL Final  . Basophils Absolute  05/30/2017 0.0  0.0 - 0.1 10e3/uL Final  . NEUT% 05/30/2017 51.9  38.4 - 76.8 % Final  . LYMPH% 05/30/2017 34.2  14.0 - 49.7 % Final  . MONO% 05/30/2017 12.8  0.0 - 14.0 % Final  . EOS% 05/30/2017 0.8  0.0 - 7.0 % Final  . BASO% 05/30/2017 0.3  0.0 - 2.0 % Final  . Retic % 05/30/2017 1.73  0.70 - 2.10 % Final  . Retic Ct Abs 05/30/2017 69.72  33.70 - 90.70 10e3/uL Final  . Immature Retic Fract 05/30/2017 6.50  1.60 - 10.00 % Final  . LDH 05/30/2017 269* 125 - 245 U/L Final  . Sodium 05/30/2017 137  136 - 145 mEq/L Final  . Potassium 05/30/2017 3.3* 3.5 - 5.1 mEq/L Final  . Chloride 05/30/2017 101  98 - 109 mEq/L Final  . CO2 05/30/2017 29  22 - 29 mEq/L Final  . Glucose 05/30/2017 96  70 - 140 mg/dl Final   Glucose reference range is for nonfasting patients. Fasting glucose reference range is 70- 100.  Marland Kitchen BUN 05/30/2017 13.2  7.0 - 26.0 mg/dL Final  . Creatinine 05/30/2017 0.8  0.6 - 1.1 mg/dL Final  . Total Bilirubin 05/30/2017 0.77  0.20 - 1.20 mg/dL Final  . Alkaline Phosphatase 05/30/2017 82  40 - 150 U/L Final  . AST 05/30/2017 171 Repeated and Verified* 5 - 34 U/L Final  . ALT 05/30/2017 106* 0 - 55 U/L Final  . Total Protein 05/30/2017 8.5* 6.4 - 8.3 g/dL Final  . Albumin 05/30/2017 3.6  3.5 - 5.0 g/dL Final  . Calcium 05/30/2017 9.4  8.4 - 10.4 mg/dL Final  . Anion Gap 05/30/2017 7  3 - 11 mEq/L Final  . EGFR 05/30/2017 85* >90 ml/min/1.73 m2 Final   eGFR is calculated using the CKD-EPI Creatinine Equation (2009)  . Uric Acid, Serum 05/30/2017 8.1* 2.6 - 7.4 mg/dl Final  . Beta-2 05/30/2017 2.1  0.6 - 2.4 mg/L Final   [employer] Immulite 2000 Immunochemiluminometric assay (ICMA)  . IgG, Qn, Serum 05/30/2017 2,292* 700 - 1600 mg/dL Final  . IgA, Qn, Serum 05/30/2017 316  64 - 422 mg/dL Final  . IgM, Qn, Serum 05/30/2017 28  26 - 217 mg/dL Final  .  Total Protein 05/30/2017 8.0  6.0 - 8.5 g/dL Final  . Albumin SerPl Elph-Mcnc 05/30/2017 3.6  2.9 - 4.4 g/dL Final  . Alpha 1  05/30/2017 0.2  0.0 - 0.4 g/dL Final  . Alpha2 Glob SerPl Elph-Mcnc 05/30/2017 0.7  0.4 - 1.0 g/dL Final  . B-Globulin SerPl Elph-Mcnc 05/30/2017 1.2  0.7 - 1.3 g/dL Final  . Gamma Glob SerPl Elph-Mcnc 05/30/2017 2.3* 0.4 - 1.8 g/dL Final  . M Protein SerPl Elph-Mcnc 05/30/2017 0.6* Not Observed g/dL Final  . Globulin, Total 05/30/2017 4.4* 2.2 - 3.9 g/dL Final  . Albumin/Glob SerPl 05/30/2017 0.9  0.7 - 1.7 Final  . IFE 1 05/30/2017 Comment   Final   Comment: Immunofixation shows IgG monoclonal protein with lambda light chain specificity. An apparent polyclonal gammopathy: IgG. Kappa and lambda typing appear increased.   . Please Note 05/30/2017 Comment   Final   Comment: Protein electrophoresis scan will follow via computer, mail, or courier delivery.   Marland Kitchen PROTEIN,TOTAL,URINE 06/01/2017 10.8  Not Estab. mg/dL Final   Total Volume: 1750 mL  . Prot,24hr calculated 06/01/2017 189* 30 - 150 mg/24 hr Final  . ALBUMIN, U 06/01/2017 25.0  % Final  . ALPHA 1 URINE 06/01/2017 3.0  % Final  . ALPHA-2-GLOBULIN, U 06/01/2017 11.4  % Final  . % BETA, Urine 06/01/2017 20.8  % Final  . GAMMA GLOBULIN URINE 06/01/2017 39.8  % Final  . M-SPIKE, % 06/01/2017 9.3* Not Observed % Final  . M-Spike, mg/24 hr 06/01/2017 18* Not Observed mg/24 hr Final  . Immunofixation Result, Urine 06/01/2017 Comment   Final   Bence Jones Protein positive; lambda type.  Marland Kitchen NOTE: 06/01/2017 Comment   Final   Comment: Protein electrophoresis scan will follow via computer, mail, or courier delivery.   . Free Kappa Lt Chains,Ur 06/01/2017 58.10* 1.35 - 24.19 mg/L Final  . Free Lambda Lt Chains,Ur 06/01/2017 5.78  0.24 - 6.66 mg/L Final  . Kappa/Lambda Ratio,U 06/01/2017 10.05  2.04 - 10.37 Final  . Ig Kappa Free Light Chain 05/30/2017 34.9* 3.3 - 19.4 mg/L Final  . Ig Lambda Free Light Chain 05/30/2017 33.2* 5.7 - 26.3 mg/L Final  . Kappa/Lambda FluidC Ratio 05/30/2017 1.05  0.26 - 1.65 Final       Ardath Sax, MD

## 2017-06-17 NOTE — Assessment & Plan Note (Signed)
76 year old female with active systemic lupus currently not on therapy referred for possible presence of monoclonal gammopathy in the peripheral blood. Our findings confirm the MGUS. Unfortunately, the patient has not had her skeletal survey yet. At this time, she does not meet any other of the CRAB criteria to suspect an active multiple myeloma.   Of note, she does have an elevated uric acid level as well as progressive transaminitis of unclear etiology.   Plan: --Skeletal survey --Rpt labs in 2 weeks for LFTs  --RTC 3 months with full myeloma panel to monitor for progression unless lytic lesions demonstrated on the Skeletal Survey. In that case, will bring in for a BM Bx sooner.

## 2017-06-19 ENCOUNTER — Telehealth: Payer: Self-pay

## 2017-06-19 ENCOUNTER — Telehealth: Payer: Self-pay | Admitting: Hematology and Oncology

## 2017-06-19 NOTE — Telephone Encounter (Signed)
Spoke with patient regarding what provider is ordering her x-ray.

## 2017-06-19 NOTE — Telephone Encounter (Signed)
Called patient and verified her appointment for MET on October 4th'@11am' . Per 9sch message

## 2017-06-22 ENCOUNTER — Ambulatory Visit (HOSPITAL_COMMUNITY)
Admission: RE | Admit: 2017-06-22 | Discharge: 2017-06-22 | Disposition: A | Discharge status: Home / Self Care | Payer: Medicare HMO | Source: Ambulatory Visit | Attending: Hematology and Oncology | Admitting: Hematology and Oncology

## 2017-06-22 DIAGNOSIS — D472 Monoclonal gammopathy: Secondary | ICD-10-CM | POA: Diagnosis present

## 2017-06-30 ENCOUNTER — Other Ambulatory Visit: Payer: Self-pay | Admitting: Rheumatology

## 2017-06-30 NOTE — Telephone Encounter (Signed)
Last Visit: 05/02/17 Next Visit: 08/22/17  Okay to refill per Dr. Estanislado Pandy

## 2017-07-11 ENCOUNTER — Other Ambulatory Visit (HOSPITAL_BASED_OUTPATIENT_CLINIC_OR_DEPARTMENT_OTHER): Payer: Medicare HMO

## 2017-07-11 DIAGNOSIS — E790 Hyperuricemia without signs of inflammatory arthritis and tophaceous disease: Secondary | ICD-10-CM

## 2017-07-11 DIAGNOSIS — D472 Monoclonal gammopathy: Secondary | ICD-10-CM | POA: Diagnosis not present

## 2017-07-11 LAB — URIC ACID: URIC ACID, SERUM: 4.3 mg/dL (ref 2.6–7.4)

## 2017-07-12 LAB — HEPATIC FUNCTION PANEL
ALT: 83 IU/L — ABNORMAL HIGH (ref 0–32)
AST (SGOT): 128 IU/L — ABNORMAL HIGH (ref 0–40)
Albumin, Serum: 4 g/dL (ref 3.5–4.8)
Alkaline Phosphatase, S: 88 IU/L (ref 39–117)
BILIRUBIN TOTAL: 0.6 mg/dL (ref 0.0–1.2)
BILIRUBIN, DIRECT: 0.21 mg/dL (ref 0.00–0.40)
TOTAL PROTEIN: 8.3 g/dL (ref 6.0–8.5)

## 2017-07-20 HISTORY — PX: EYE SURGERY: SHX253

## 2017-07-29 ENCOUNTER — Other Ambulatory Visit: Payer: Self-pay | Admitting: Hematology and Oncology

## 2017-08-09 NOTE — Progress Notes (Deleted)
Office Visit Note  Patient: Deanna Hart             Date of Birth: [birth year]           MRN: [id number]             PCP: [name], MD Referring: [name], MD Visit Date: 08/22/2017 Occupation: @GUAROCC @    Subjective:  No chief complaint on file.   History of Present Illness: Deanna Hart is a 76 y.o. female ***   Activities of Daily Living:  Patient reports morning stiffness for *** {minute/hour:19697}.   Patient {ACTIONS;DENIES/REPORTS:21021675::"Denies"} nocturnal pain.  Difficulty dressing/grooming: {ACTIONS;DENIES/REPORTS:21021675::"Denies"} Difficulty climbing stairs: {ACTIONS;DENIES/REPORTS:21021675::"Denies"} Difficulty getting out of chair: {ACTIONS;DENIES/REPORTS:21021675::"Denies"} Difficulty using hands for taps, buttons, cutlery, and/or writing: {ACTIONS;DENIES/REPORTS:21021675::"Denies"}   No Rheumatology ROS completed.   PMFS History:  Patient Active Problem List   Diagnosis Date Noted  . MGUS (monoclonal gammopathy of unknown significance) 06/01/2017  . Hyperuricemia 06/01/2017  . Elevated LFTs 03/03/2017  . Acute pain of right shoulder 11/28/2016  . High risk medications (not anticoagulants) long-term use 07/19/2016  . Rheumatoid factor positive 07/19/2016  . SLE (systemic lupus erythematosus) ([place]) 07/18/2016  . Primary osteoarthritis of both hands 07/18/2016  . Primary osteoarthritis of both feet 07/18/2016  . Calcaneal spur of both feet 07/18/2016  . Osteoarthritis of both knees 07/18/2016  . Hypertension 07/18/2016  . Obesity 07/18/2016  . Vitamin D deficiency 07/18/2016    Past Medical History:  Diagnosis Date  . Calcaneal spur of both feet 07/18/2016  . Hypertension 07/18/2016  . Obesity 07/18/2016  . Osteoarthritis of both feet 07/18/2016  . Osteoarthritis of both hands 07/18/2016  . 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   Marland Kitchen SLE (systemic lupus erythematosus) ([place])  07/18/2016   Positive ANA, Positive Ro, Positive Smith, Positive RNP, Positive RF, Negative CCP,  Elevated LFTs  . Vitamin D deficiency 07/18/2016    Family History  Problem Relation Age of Onset  . Cancer Brother   . Cancer Maternal Uncle    Past Surgical History:  Procedure Laterality Date  . BREAST SURGERY     Social History   Social History Narrative  . Not on file     Objective: Vital Signs: There were no vitals taken for this visit.   Physical Exam   Musculoskeletal Exam: ***  CDAI Exam: No CDAI exam completed.    Investigation: No additional findings. CBC Latest Ref Rng & Units 05/30/2017 04/21/2017 02/20/2017  WBC 3.9 - 10.3 10e3/uL 3.7(L) 3.6(L) 4.3  Hemoglobin 11.6 - 15.9 g/dL 13.1 13.2 13.4  Hematocrit 34.8 - 46.6 % 38.9 38.9 40.7  Platelets 145 - 400 10e3/uL 178 202 201   CMP Latest Ref Rng & Units 07/11/2017 05/30/2017 05/30/2017  Glucose 70 - 140 mg/dl - 96 -  BUN 7.0 - 26.0 mg/dL - 13.2 -  Creatinine 0.6 - 1.1 mg/dL - 0.8 -  Sodium 136 - 145 mEq/L - 137 -  Potassium 3.5 - 5.1 mEq/L - 3.3(L) -  Chloride 98 - 110 mmol/L - - -  CO2 22 - 29 mEq/L - 29 -  Calcium 8.4 - 10.4 mg/dL - 9.4 -  Total Protein 6.0 - 8.5 g/dL 8.3 8.5(H) 8.0  Total Bilirubin 0.0 - 1.2 mg/dL 0.6 0.77 -  Alkaline Phos 39 - 117 IU/L 88 82 -  AST 0 - 40 IU/L 128(H) 171 Repeated and Verified(HH) -  ALT 0 - 32 IU/L 83(H) 106(H) -  Imaging: No results found.  Speciality Comments: No specialty comments available.    Procedures:  No procedures performed Allergies: Plaquenil [hydroxychloroquine sulfate]   Assessment / Plan:     Visit Diagnoses: No diagnosis found.    Orders: No orders of the defined types were placed in this encounter.  No orders of the defined types were placed in this encounter.   Face-to-face time spent with patient was *** minutes. 50% of time was spent in counseling and coordination of care.  Follow-Up Instructions: No Follow-up on  file.   Earnestine Mealing, CMA  Note - This record has been created using Editor, commissioning.  Chart creation errors have been sought, but may not always  have been located. Such creation errors do not reflect on  the standard of medical care.

## 2017-08-22 ENCOUNTER — Ambulatory Visit: Payer: Medicare HMO | Admitting: Rheumatology

## 2017-10-03 ENCOUNTER — Inpatient Hospital Stay: Payer: Medicare HMO | Attending: Hematology and Oncology

## 2017-10-03 DIAGNOSIS — E559 Vitamin D deficiency, unspecified: Secondary | ICD-10-CM | POA: Diagnosis not present

## 2017-10-03 DIAGNOSIS — E669 Obesity, unspecified: Secondary | ICD-10-CM | POA: Insufficient documentation

## 2017-10-03 DIAGNOSIS — M329 Systemic lupus erythematosus, unspecified: Secondary | ICD-10-CM | POA: Diagnosis not present

## 2017-10-03 DIAGNOSIS — Z87891 Personal history of nicotine dependence: Secondary | ICD-10-CM | POA: Insufficient documentation

## 2017-10-03 DIAGNOSIS — D472 Monoclonal gammopathy: Secondary | ICD-10-CM | POA: Diagnosis present

## 2017-10-03 DIAGNOSIS — Z79899 Other long term (current) drug therapy: Secondary | ICD-10-CM | POA: Insufficient documentation

## 2017-10-03 DIAGNOSIS — I10 Essential (primary) hypertension: Secondary | ICD-10-CM | POA: Diagnosis not present

## 2017-10-03 LAB — CBC WITH DIFFERENTIAL/PLATELET
BASOS ABS: 0 10*3/uL (ref 0.0–0.1)
BASOS PCT: 0 %
Eosinophils Absolute: 0 10*3/uL (ref 0.0–0.5)
Eosinophils Relative: 1 %
HEMATOCRIT: 41.6 % (ref 34.8–46.6)
HEMOGLOBIN: 13.7 g/dL (ref 11.6–15.9)
LYMPHS PCT: 30 %
Lymphs Abs: 1 10*3/uL (ref 0.9–3.3)
MCH: 32.8 pg (ref 25.1–34.0)
MCHC: 33.1 g/dL (ref 31.5–36.0)
MCV: 99.2 fL (ref 79.5–101.0)
MONO ABS: 0.4 10*3/uL (ref 0.1–0.9)
Monocytes Relative: 13 %
NEUTROS ABS: 1.9 10*3/uL (ref 1.5–6.5)
NEUTROS PCT: 56 %
PLATELETS: 174 10*3/uL (ref 145–400)
RBC: 4.19 MIL/uL (ref 3.70–5.45)
RDW: 14.3 % (ref 11.2–16.1)
WBC: 3.4 10*3/uL — AB (ref 3.9–10.3)

## 2017-10-03 LAB — COMPREHENSIVE METABOLIC PANEL
ALK PHOS: 106 U/L (ref 40–150)
ALT: 202 U/L — ABNORMAL HIGH (ref 0–55)
ANION GAP: 8 (ref 3–11)
AST: 342 U/L (ref 5–34)
Albumin: 3.7 g/dL (ref 3.5–5.0)
BUN: 9 mg/dL (ref 7–26)
CALCIUM: 9.6 mg/dL (ref 8.4–10.4)
CO2: 28 mmol/L (ref 22–29)
Chloride: 102 mmol/L (ref 98–109)
Creatinine, Ser: 0.74 mg/dL (ref 0.60–1.10)
Glucose, Bld: 94 mg/dL (ref 70–140)
Potassium: 4 mmol/L (ref 3.3–4.7)
Sodium: 138 mmol/L (ref 136–145)
Total Bilirubin: 0.9 mg/dL (ref 0.2–1.2)
Total Protein: 8.8 g/dL — ABNORMAL HIGH (ref 6.4–8.3)

## 2017-10-03 LAB — LACTATE DEHYDROGENASE: LDH: 338 U/L — AB (ref 125–245)

## 2017-10-03 LAB — URIC ACID: URIC ACID, SERUM: 3.8 mg/dL (ref 2.6–7.4)

## 2017-10-04 LAB — BETA 2 MICROGLOBULIN, SERUM: Beta-2 Microglobulin: 2.1 mg/L (ref 0.6–2.4)

## 2017-10-04 LAB — KAPPA/LAMBDA LIGHT CHAINS
KAPPA FREE LGHT CHN: 42.1 mg/L — AB (ref 3.3–19.4)
KAPPA, LAMDA LIGHT CHAIN RATIO: 1.39 (ref 0.26–1.65)
LAMDA FREE LIGHT CHAINS: 30.3 mg/L — AB (ref 5.7–26.3)

## 2017-10-05 LAB — MULTIPLE MYELOMA PANEL, SERUM
ALBUMIN/GLOB SERPL: 0.8 (ref 0.7–1.7)
Albumin SerPl Elph-Mcnc: 3.3 g/dL (ref 2.9–4.4)
Alpha 1: 0.3 g/dL (ref 0.0–0.4)
Alpha2 Glob SerPl Elph-Mcnc: 0.8 g/dL (ref 0.4–1.0)
B-GLOBULIN SERPL ELPH-MCNC: 1.3 g/dL (ref 0.7–1.3)
Gamma Glob SerPl Elph-Mcnc: 2.1 g/dL — ABNORMAL HIGH (ref 0.4–1.8)
Globulin, Total: 4.5 g/dL — ABNORMAL HIGH (ref 2.2–3.9)
IGA: 366 mg/dL (ref 64–422)
IgG (Immunoglobin G), Serum: 2709 mg/dL — ABNORMAL HIGH (ref 700–1600)
IgM (Immunoglobulin M), Srm: 30 mg/dL (ref 26–217)
M Protein SerPl Elph-Mcnc: 0.7 g/dL — ABNORMAL HIGH
Total Protein ELP: 7.8 g/dL (ref 6.0–8.5)

## 2017-10-10 ENCOUNTER — Telehealth: Payer: Self-pay | Admitting: Hematology and Oncology

## 2017-10-10 ENCOUNTER — Inpatient Hospital Stay (HOSPITAL_BASED_OUTPATIENT_CLINIC_OR_DEPARTMENT_OTHER): Payer: Medicare HMO | Admitting: Hematology and Oncology

## 2017-10-10 ENCOUNTER — Encounter: Payer: Self-pay | Admitting: Hematology and Oncology

## 2017-10-10 ENCOUNTER — Other Ambulatory Visit: Payer: Self-pay | Admitting: Rheumatology

## 2017-10-10 VITALS — BP 149/83 | HR 71 | Temp 98.3°F | Resp 18 | Ht 64.0 in | Wt 204.5 lb

## 2017-10-10 DIAGNOSIS — R740 Nonspecific elevation of levels of transaminase and lactic acid dehydrogenase [LDH]: Secondary | ICD-10-CM

## 2017-10-10 DIAGNOSIS — D472 Monoclonal gammopathy: Secondary | ICD-10-CM

## 2017-10-10 DIAGNOSIS — R7401 Elevation of levels of liver transaminase levels: Secondary | ICD-10-CM

## 2017-10-10 NOTE — Telephone Encounter (Signed)
Gave patient avs and calendar with appts per 1/22 los.  °

## 2017-10-11 ENCOUNTER — Telehealth: Payer: Self-pay | Admitting: Rheumatology

## 2017-10-11 ENCOUNTER — Encounter: Payer: Self-pay | Admitting: Rheumatology

## 2017-10-11 ENCOUNTER — Ambulatory Visit: Payer: Medicare HMO | Admitting: Rheumatology

## 2017-10-11 VITALS — BP 137/95 | HR 93 | Resp 17 | Ht 64.0 in | Wt 204.0 lb

## 2017-10-11 DIAGNOSIS — M19041 Primary osteoarthritis, right hand: Secondary | ICD-10-CM

## 2017-10-11 DIAGNOSIS — M19072 Primary osteoarthritis, left ankle and foot: Secondary | ICD-10-CM

## 2017-10-11 DIAGNOSIS — R7989 Other specified abnormal findings of blood chemistry: Secondary | ICD-10-CM

## 2017-10-11 DIAGNOSIS — M7732 Calcaneal spur, left foot: Secondary | ICD-10-CM | POA: Diagnosis not present

## 2017-10-11 DIAGNOSIS — D472 Monoclonal gammopathy: Secondary | ICD-10-CM

## 2017-10-11 DIAGNOSIS — M3219 Other organ or system involvement in systemic lupus erythematosus: Secondary | ICD-10-CM | POA: Diagnosis not present

## 2017-10-11 DIAGNOSIS — M19071 Primary osteoarthritis, right ankle and foot: Secondary | ICD-10-CM

## 2017-10-11 DIAGNOSIS — M7731 Calcaneal spur, right foot: Secondary | ICD-10-CM | POA: Diagnosis not present

## 2017-10-11 DIAGNOSIS — R945 Abnormal results of liver function studies: Secondary | ICD-10-CM

## 2017-10-11 DIAGNOSIS — Z8679 Personal history of other diseases of the circulatory system: Secondary | ICD-10-CM

## 2017-10-11 DIAGNOSIS — M170 Bilateral primary osteoarthritis of knee: Secondary | ICD-10-CM

## 2017-10-11 DIAGNOSIS — Z8639 Personal history of other endocrine, nutritional and metabolic disease: Secondary | ICD-10-CM

## 2017-10-11 DIAGNOSIS — M19042 Primary osteoarthritis, left hand: Secondary | ICD-10-CM

## 2017-10-11 DIAGNOSIS — Z79899 Other long term (current) drug therapy: Secondary | ICD-10-CM | POA: Diagnosis not present

## 2017-10-11 MED ORDER — PREDNISONE 5 MG PO TABS: ORAL_TABLET | ORAL | 0 refills | Status: DC

## 2017-10-11 NOTE — Telephone Encounter (Signed)
Patient has been scheduled for today a 1 pm

## 2017-10-11 NOTE — Progress Notes (Signed)
Office Visit Note  Patient: Deanna Hart             Date of Birth: 08-23-1941           MRN: [id number]             PCP: [name], MD Referring: [name], MD Visit Date: 10/11/2017 Occupation: @GUAROCC @    Subjective:  Other (elevated liver functions )   History of Present Illness: Deanna Hart is a 77 y.o. female history of systemic lupus or dermatosis. We called her today on urgent basis as she got off message from her hematologist regarding elevation in her LFTs. Patient states she missed her last appointment that she had cataract surgery on her eyes. She has recovered quite well from her cataract surgery. She also saw Dr. Collene Mares for evaluation of elevated LFTs. Her workup was negative except for positive ANA. She's been off all immunosuppressive agents because of side effects in the past. She could not tolerate Plaquenil in the past and methotrexate was discontinued due to elevation of LFTs. She denies any joint pain or joint swelling. She denies any history of fatigue, rash, oral ulcers, nasal ulcers, hair loss or Raynaud's phenomenon.  Activities of Daily Living:  Patient reports morning stiffness for 0 minute.   Patient Denies nocturnal pain.  Difficulty dressing/grooming: Denies Difficulty climbing stairs: Denies Difficulty getting out of chair: Denies Difficulty using hands for taps, buttons, cutlery, and/or writing: Denies   Review of Systems  Constitutional: Negative for fatigue, night sweats, weight gain, weight loss and weakness.  HENT: Negative for mouth sores, trouble swallowing, trouble swallowing, mouth dryness and nose dryness.   Eyes: Negative for pain, redness, visual disturbance and dryness.  Respiratory: Negative for cough, shortness of breath and difficulty breathing.   Cardiovascular: Negative for chest pain, palpitations, hypertension, irregular heartbeat and swelling in legs/feet.  Gastrointestinal: Negative for blood in stool,  constipation and diarrhea.  Endocrine: Negative for increased urination.  Genitourinary: Negative for vaginal dryness.  Musculoskeletal: Negative for arthralgias, joint pain, joint swelling, myalgias, muscle weakness, morning stiffness, muscle tenderness and myalgias.  Skin: Negative for color change, rash, hair loss, skin tightness, ulcers and sensitivity to sunlight.  Allergic/Immunologic: Negative for susceptible to infections.  Neurological: Negative for dizziness, memory loss and night sweats.  Hematological: Negative for swollen glands.  Psychiatric/Behavioral: Negative for depressed mood and sleep disturbance. The patient is not nervous/anxious.     PMFS History:  Patient Active Problem List   Diagnosis Date Noted  . MGUS (monoclonal gammopathy of unknown significance) 06/01/2017  . Hyperuricemia 06/01/2017  . Elevated LFTs 03/03/2017  . Acute pain of right shoulder 11/28/2016  . High risk medications (not anticoagulants) long-term use 07/19/2016  . Rheumatoid factor positive 07/19/2016  . SLE (systemic lupus erythematosus) ([place]) 07/18/2016  . Primary osteoarthritis of both hands 07/18/2016  . Primary osteoarthritis of both feet 07/18/2016  . Calcaneal spur of both feet 07/18/2016  . Osteoarthritis of both knees 07/18/2016  . Hypertension 07/18/2016  . Obesity 07/18/2016  . Vitamin D deficiency 07/18/2016    Past Medical History:  Diagnosis Date  . Calcaneal spur of both feet 07/18/2016  . Hypertension 07/18/2016  . Obesity 07/18/2016  . Osteoarthritis of both feet 07/18/2016  . Osteoarthritis of both hands 07/18/2016  . 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   Marland Kitchen SLE (systemic lupus erythematosus) ([place]) 07/18/2016   Positive ANA, Positive Ro, Positive Smith, Positive RNP, Positive RF,  Negative CCP,  Elevated LFTs  . Vitamin D deficiency 07/18/2016    Family History  Problem Relation Age of Onset  . Cancer Brother   . Cancer  Maternal Uncle    Past Surgical History:  Procedure Laterality Date  . BREAST SURGERY    . EYE SURGERY Bilateral 07/2017   cataracts   Social History   Social History Narrative  . Not on file     Objective: Vital Signs: BP (!) 137/95 (BP Location: Left Arm, Patient Position: Sitting, Cuff Size: Normal)   Pulse 93   Resp 17   Ht 5\' 4"  (1.626 m)   Wt 204 lb (92.5 kg)   BMI 35.02 kg/m    Physical Exam  Constitutional: She is oriented to person, place, and time. She appears well-developed and well-nourished.  HENT:  Head: Normocephalic and atraumatic.  Eyes: Conjunctivae and EOM are normal.  Neck: Normal range of motion.  Cardiovascular: Normal rate, regular rhythm, normal heart sounds and intact distal pulses.  Pulmonary/Chest: Effort normal and breath sounds normal.  Abdominal: Soft. Bowel sounds are normal.  Lymphadenopathy:    She has no cervical adenopathy.  Neurological: She is alert and oriented to person, place, and time.  Skin: Skin is warm and dry. Capillary refill takes less than 2 seconds.  Psychiatric: She has a normal mood and affect. Her behavior is normal.  Nursing note and vitals reviewed.    Musculoskeletal Exam: C-spine and thoracic and lumbar spine good range of motion. Shoulder joints elbow joints range of motion. She has DIP PIP thickening of her hands and feet consistent with osteoarthritis. No synovitis was noted. She doesn't osteoarthritis in her knee joints and had some warmth in her left knee joint. No swelling or effusion was noted.  CDAI Exam: No CDAI exam completed.    Investigation: No additional findings. CBC Latest Ref Rng & Units 10/03/2017 05/30/2017 04/21/2017  WBC 3.9 - 10.3 K/uL 3.4(L) 3.7(L) 3.6(L)  Hemoglobin 11.6 - 15.9 g/dL 13.7 13.1 13.2  Hematocrit 34.8 - 46.6 % 41.6 38.9 38.9  Platelets 145 - 400 K/uL 174 178 202   CMP Latest Ref Rng & Units 10/03/2017 07/11/2017 05/30/2017  Glucose 70 - 140 mg/dL 94 - 96  BUN 7 - 26 mg/dL 9  - 13.2  Creatinine 0.60 - 1.10 mg/dL 0.74 - 0.8  Sodium 136 - 145 mmol/L 138 - 137  Potassium 3.3 - 4.7 mmol/L 4.0 - 3.3(L)  Chloride 98 - 109 mmol/L 102 - -  CO2 22 - 29 mmol/L 28 - 29  Calcium 8.4 - 10.4 mg/dL 9.6 - 9.4  Total Protein 6.4 - 8.3 g/dL 8.8(H) 8.3 8.5(H)  Total Bilirubin 0.2 - 1.2 mg/dL 0.9 0.6 0.77  Alkaline Phos 40 - 150 U/L 106 88 82  AST 5 - 34 U/L 342(HH) 128(H) 171 Repeated and Verified(HH)  ALT 0 - 55 U/L 202(H) 83(H) 106(H)  04/21/2017 TPM T negative, hep panel was negative, IFE showed monoclonal gammopathy  Imaging: No results found.  Speciality Comments: No specialty comments available.    Procedures:  No procedures performed Allergies: Plaquenil [hydroxychloroquine sulfate]   Assessment / Plan:     Visit Diagnoses: Other systemic lupus erythematosus with other organ involvement ([facility]CC) - +ANA +Ro +Smith +RNP + RF neg CCP / h/o skin bipsy Dr Ronnald Ramp + biopsy. Patient has no other clinical features of lupus. She had positive skin biopsy. She has elevated LFTs even prior to starting on Plaquenil. I believe her elevation of LFTs  is related to autoimmune disease.  High risk medication use - Methotrexate was discontinued due to nausea vomiting and fatigue. The records review show that she had nausea and dizziness with Plaquenil use in 2016.  Elevated LFTs - She has chronic elevation of liver functions. LFTs are higher now. The most likely cause of elevated LFTs is autoimmune disease. We had detailed discussion regarding that. My plan is to start her on prednisone and tapered gradually. If her LFTs respond to prednisone then we will add Imuran. The plan is to add prednisone 40 mg for 4 days and then taper by 5 mg every 4 days until she reaches 20 mg then taper it by 5 mg every week until she reaches 10 mg and then taper by 2.5 mg every 2 weeks. As she is on allopurinol I will try a lower dose of Imuran starting at 50 mg after her LFTs are better and then increase it to 75  mg if the labs are stable.  Medication counseling:  TPMT: August 2018  Patient was counseled on the purpose, proper use, and adverse effects of azathioprine including risk of infection, nausea, rash, and hair loss.  Reviewed risk of cancer after long term use.  Discussed risk of myelosupression and reviewed importance of frequent lab work to monitor blood counts.  Reviewed drug-drug interactions including contraindication with allopurinol.  Provided patient with educational materials on azathioprine and answered all questions.  Patient consented to azathioprine.  Will upload consent into the media tab.    Primary osteoarthritis of both hands: She is does have some osteoarthritic changes in her hands.  Primary osteoarthritis of both feet: Not causing much discomfort.  Primary osteoarthritis of both knees: She has some discomfort in her left knee.  Calcaneal spur of both feet  MGUS (monoclonal gammopathy of unknown significance): She's been followed up by hematology.  History of vitamin D deficiency: She is on vitamin D  History of hypertension    Orders: No orders of the defined types were placed in this encounter.  No orders of the defined types were placed in this encounter.   Face-to-face time spent with patient was 30 minutes. Greater than 50% of time was spent in counseling and coordination of care.  Follow-Up Instructions: Return in about 2 weeks (around 10/25/2017) for SLE,OA, elevated LFTs.   Bo Merino, MD  Note - This record has been created using Editor, commissioning.  Chart creation errors have been sought, but may not always  have been located. Such creation errors do not reflect on  the standard of medical care.

## 2017-10-11 NOTE — Patient Instructions (Addendum)
8 tablets a day x4 days (10/11/2017- 10/14/2017)  7 tablets a day x4 days (10/15/2017- 10/18/2017) 6 tablets a day x4 days (10/19/2017- 10/22/2017) 5 tablets a day x4 days (10/23/2017-10/26/2017) 4 tablets a day x7 days (10/27/2017-11/02/2017) 3 tablets a day x7 days (11/03/2017-11/09/2017) 2 tablets a day x14 days (11/10/2017- 11/23/2017)  1.5 tablets a day x14 days (11/24/2017-12/07/2017) 1 tablet a day x14 days (12/08/2017- 12/22/2017)  .5 tablet a day x14 day (12/23/2017- 01/05/2018)  Standing Labs We placed an order today for your standing lab work.    Please come back and get your standing labs in 2 weeks x4.   We have open lab Monday through Friday from 8:30-11:30 AM and 1:30-4 PM at the office of Dr. Bo Merino.   The office is located at [address] [place], [place], [place] [address] No appointment is necessary.   Labs are drawn by [facility].  You may receive a bill from [facility] for your lab work. If you have any questions regarding directions or hours of operation,  please call [phone].        Azathioprine tablets What is this medicine? AZATHIOPRINE (ay za THYE oh preen) suppresses the immune system. It is used to prevent organ rejection after a transplant. It is also used to treat rheumatoid arthritis. This medicine may be used for other purposes; ask your health care provider or pharmacist if you have questions. COMMON BRAND NAME(S): Azasan, Imuran What should I tell my health care provider before I take this medicine? They need to know if you have any of these conditions: -infection -kidney disease -liver disease -an unusual or allergic reaction to azathioprine, other medicines, lactose, foods, dyes, or preservatives -pregnant or trying to get pregnant -breast feeding How should I use this medicine? Take this medicine by mouth with a full glass of water. Follow the directions on the prescription label. Take your medicine at regular intervals. Do not take your medicine more  often than directed. Continue to take your medicine even if you feel better. Do not stop taking except on your doctor's advice. Talk to your pediatrician regarding the use of this medicine in children. Special care may be needed. Overdosage: If you think you have taken too much of this medicine contact a poison control center or emergency room at once. NOTE: This medicine is only for you. Do not share this medicine with others. What if I miss a dose? If you miss a dose, take it as soon as you can. If it is almost time for your next dose, take only that dose. Do not take double or extra doses. What may interact with this medicine? Do not take this medicine with any of the following medications: -febuxostat -mercaptopurine This medicine may also interact with the following medications: -allopurinol -aminosalicylates like sulfasalazine, mesalamine, balsalazide, and olsalazine -leflunomide -medicines called ACE inhibitors like benazepril, captopril, enalapril, fosinopril, quinapril, lisinopril, ramipril, and trandolapril -mycophenolate -sulfamethoxazole; trimethoprim -vaccines -warfarin This list may not describe all possible interactions. Give your health care provider a list of all the medicines, herbs, non-prescription drugs, or dietary supplements you use. Also tell them if you smoke, drink alcohol, or use illegal drugs. Some items may interact with your medicine. What should I watch for while using this medicine? Visit your doctor or health care professional for regular checks on your progress. You will need frequent blood checks during the first few months you are receiving the medicine. If you get a cold or other infection while receiving this medicine,  call your doctor or health care professional. Do not treat yourself. The medicine may increase your risk of getting an infection. Women should inform their doctor if they wish to become pregnant or think they might be pregnant. There is a  potential for serious side effects to an unborn child. Talk to your health care professional or pharmacist for more information. Men may have a reduced sperm count while they are taking this medicine. Talk to your health care professional for more information. This medicine may increase your risk of getting certain kinds of cancer. Talk to your doctor about healthy lifestyle choices, important screenings, and your risk. What side effects may I notice from receiving this medicine? Side effects that you should report to your doctor or health care professional as soon as possible: -allergic reactions like skin rash, itching or hives, swelling of the face, lips, or tongue -changes in vision -confusion -fever, chills, or any other sign of infection -loss of balance or coordination -severe stomach pain -unusual bleeding, bruising -unusually weak or tired -vomiting -yellowing of the eyes or skin Side effects that usually do not require medical attention (report to your doctor or health care professional if they continue or are bothersome): -hair loss -nausea This list may not describe all possible side effects. Call your doctor for medical advice about side effects. You may report side effects to FDA at 1-800-FDA-1088. Where should I keep my medicine? Keep out of the reach of children. Store at room temperature between 15 and 25 degrees C (59 and 77 degrees F). Protect from light. Throw away any unused medicine after the expiration date. NOTE: This sheet is a summary. It may not cover all possible information. If you have questions about this medicine, talk to your doctor, pharmacist, or health care provider.  2018 Elsevier/Gold Standard (2013-12-31 12:00:31)

## 2017-10-11 NOTE — Telephone Encounter (Signed)
Be called patient to schedule appointment for today. I also spoke with Dr. Collene Mares who was in agreement the patient most likely has autoimmune disease. All the workup at her office was negative. I plan to start her on prednisone and if she responds to prednisone and will start Imuran.

## 2017-10-11 NOTE — Telephone Encounter (Signed)
Patient called stating that her physician Dr. Lebron Conners recommended that she get an appointment with Dr. Estanislado Pandy this week due to "a problem with her liver."  Her CB# [phone]

## 2017-10-12 ENCOUNTER — Telehealth: Payer: Self-pay | Admitting: Rheumatology

## 2017-10-12 NOTE — Telephone Encounter (Signed)
Attempted to contact the patient and left message for patient to call the office.  

## 2017-10-12 NOTE — Telephone Encounter (Signed)
Patient left a voicemail stating that she went to the pharmacy to pick up her refill and she was confused about the medication.  Patient's CB# [phone]

## 2017-10-13 NOTE — Telephone Encounter (Signed)
Patient thought she was to continue Folic Acid and was inquiring as to why it was denied at the pharmacy if she is to continue the Folic Acid. Patient advised she does not need to take as she is no longer on MTX. Patient verbalized understanding.

## 2017-10-20 ENCOUNTER — Ambulatory Visit (HOSPITAL_COMMUNITY)
Admission: RE | Admit: 2017-10-20 | Discharge: 2017-10-20 | Disposition: A | Discharge status: Home / Self Care | Payer: Medicare HMO | Source: Ambulatory Visit | Attending: Hematology and Oncology | Admitting: Hematology and Oncology

## 2017-10-20 DIAGNOSIS — R740 Nonspecific elevation of levels of transaminase and lactic acid dehydrogenase [LDH]: Secondary | ICD-10-CM | POA: Insufficient documentation

## 2017-10-20 DIAGNOSIS — N281 Cyst of kidney, acquired: Secondary | ICD-10-CM | POA: Insufficient documentation

## 2017-10-20 DIAGNOSIS — K760 Fatty (change of) liver, not elsewhere classified: Secondary | ICD-10-CM | POA: Diagnosis not present

## 2017-10-20 DIAGNOSIS — R7401 Elevation of levels of liver transaminase levels: Secondary | ICD-10-CM

## 2017-10-25 ENCOUNTER — Other Ambulatory Visit: Payer: Self-pay

## 2017-10-25 DIAGNOSIS — Z79899 Other long term (current) drug therapy: Secondary | ICD-10-CM

## 2017-10-25 LAB — CBC WITH DIFFERENTIAL/PLATELET
BASOS ABS: 29 {cells}/uL (ref 0–200)
Basophils Relative: 0.4 %
EOS ABS: 58 {cells}/uL (ref 15–500)
Eosinophils Relative: 0.8 %
HCT: 38.9 % (ref 35.0–45.0)
Hemoglobin: 13 g/dL (ref 11.7–15.5)
Lymphs Abs: 2801 cells/uL (ref 850–3900)
MCH: 32.2 pg (ref 27.0–33.0)
MCHC: 33.4 g/dL (ref 32.0–36.0)
MCV: 96.3 fL (ref 80.0–100.0)
MONOS PCT: 12 %
MPV: 11.9 fL (ref 7.5–12.5)
Neutro Abs: 3449 cells/uL (ref 1500–7800)
Neutrophils Relative %: 47.9 %
PLATELETS: 151 10*3/uL (ref 140–400)
RBC: 4.04 10*6/uL (ref 3.80–5.10)
RDW: 13.1 % (ref 11.0–15.0)
TOTAL LYMPHOCYTE: 38.9 %
WBC: 7.2 10*3/uL (ref 3.8–10.8)
WBCMIX: 864 {cells}/uL (ref 200–950)

## 2017-10-25 LAB — COMPLETE METABOLIC PANEL WITH GFR
AG RATIO: 1 (calc) (ref 1.0–2.5)
ALBUMIN MSPROF: 3.5 g/dL — AB (ref 3.6–5.1)
ALT: 66 U/L — ABNORMAL HIGH (ref 6–29)
AST: 62 U/L — AB (ref 10–35)
Alkaline phosphatase (APISO): 115 U/L (ref 33–130)
BUN: 11 mg/dL (ref 7–25)
CALCIUM: 9.5 mg/dL (ref 8.6–10.4)
CO2: 32 mmol/L (ref 20–32)
Chloride: 104 mmol/L (ref 98–110)
Creat: 0.65 mg/dL (ref 0.60–0.93)
GFR, EST NON AFRICAN AMERICAN: 86 mL/min/{1.73_m2} (ref >=60)
GFR, Est African American: 99 mL/min/{1.73_m2} (ref >=60)
Globulin: 3.4 g/dL (calc) (ref 1.9–3.7)
Glucose, Bld: 116 mg/dL — ABNORMAL HIGH (ref 65–99)
POTASSIUM: 4.5 mmol/L (ref 3.5–5.3)
SODIUM: 141 mmol/L (ref 135–146)
Total Bilirubin: 0.6 mg/dL (ref 0.2–1.2)
Total Protein: 6.9 g/dL (ref 6.1–8.1)

## 2017-10-29 NOTE — Assessment & Plan Note (Signed)
77 year old female with active systemic lupus currently not on therapy followed for monoclonal gammopathy of unknown significance, confirmed by our evaluation. At this time, she does not meet any CRAB criteria to suspect an active multiple myeloma.   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  Patient is under observation of rheumatology and I will defer intervention decision to their expertise.  Plan: -Patient will contact rheumatology for recommendations regarding progressive transaminitis. - Right upper quadrant ultrasound - MGUS follow-up: Return to our clinic in 3 months with labs obtained 1 week prior for continued hematological monitoring.

## 2017-10-29 NOTE — Progress Notes (Signed)
[facility] Follow-up Visit:  Assessment: MGUS (monoclonal gammopathy of unknown significance) 77 year old female with active systemic lupus currently not on therapy followed for monoclonal gammopathy of unknown significance, confirmed by our evaluation. At this time, she does not meet any CRAB criteria to suspect an active multiple myeloma.   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  Patient is under observation of rheumatology and I will defer intervention decision to their expertise.  Plan: -Patient will contact rheumatology for recommendations regarding progressive transaminitis. - Right upper quadrant ultrasound - MGUS follow-up: Return to our clinic in 3 months with labs obtained 1 week prior for continued hematological monitoring.  Voice recognition software was used and creation of this note. Despite my best effort at editing the text, some misspelling/errors may have occurred.  Orders Placed This Encounter  Procedures  . US Abdomen Complete    Standing Status:   Future    Number of Occurrences:   1    Standing Expiration Date:   10/10/2018    Order Specific Question:   Reason for Exam (SYMPTOM  OR DIAGNOSIS REQUIRED)    Answer:   Progressive AST/ALT elevation, please eval hepatic parenchymal change/inflammation    Order Specific Question:   Preferred imaging location?    Answer:   [facility]  . CBC with Differential (Cancer Center Only)    Standing Status:   Future    Standing Expiration Date:   10/10/2018  . CMP ([facility] only)    Standing Status:   Future    Standing Expiration Date:   10/10/2018  . Lactate dehydrogenase (LDH)    Standing Status:   Future    Standing Expiration Date:   10/10/2018  . Multiple Myeloma Panel (SPEP&IFE w/QIG)    Standing Status:   Future     Standing Expiration Date:   10/10/2018  . Kappa/lambda light chains    Standing Status:   Future    Standing Expiration Date:   10/10/2018  . 24-Hr Ur UPEP/UIFE/Light Chains/TP    Standing Status:   Future    Standing Expiration Date:   10/10/2018    Cancer Staging No matching staging information was found for the patient.  All questions were answered.  . The patient knows to call the clinic with any problems, questions or concerns.  This note was electronically signed.    History of Presenting Illness Deanna Hart is a 77 y.o. female followed in the [facility] for monoclonal gammopathy. Patient has a diagnosis of systemic lupus erythematosus and positive rheumatoid factor and was undergoing completion of evaluation for her rheumatologist when laboratory abnormality was discovered. Before meals oncological/hematological history below for details. It appears that patient has been tried on Plaquenil followed by methotrexate which she has been unable to tolerate. The current rheumatological plan is to be treated with Imuran or Benlysta.  At the present time, patient denies any active new complaints. She does have extensive musculoskeletal complaints, but denies any respiratory, gastrointestinal, or neurological complaints. Patient returns to the clinic to review the findings.  Oncological/hematological History: --Labs, 04/21/17: tProt 7.5, Alb 3.7, Ca 9.3, Cr 0.7, AP   71, AST   86, ALT   60, tBili 0.6;         SPEP -- M-Spike 0.9g/dL, SIFE -- positive for IgG lambda presence; WBC 3.6, Nl diff, Hgb 13.2, Plt 202; HCV --  negative --Labs, 05/30/17: tProt 8.5, Alb 3.6, Ca 9.4, Cr 0.8, AP   82, AST 171, ALT 106, tBili 0.8;         SPEP -- M-Spike 0.6g/dL, SIFE -- positive for IgG lambda presence;           IgG 2292, IgA 316, IgM 28; kappa 34.9, lambda 33.2, KLR 1.05; UPEP -- M-Spike 83m/24hr; Uric acid 8.1;  --Labs, 10/03/17: tProt 8.8, Alb 3.7, Ca 9.6, Cr 0.7, AP 106, AST 342, ALT 202,  LDH 338, beta-2 microglobulin 2.1; SPEP -- M-Spike 0.7g/dL, SIFE --monoclonal IgG lambda, polyclonal IgG is present; IgG 2709, IgA 366, IgM 30; kappa 42.1, lambda 30.3, KLR 1.39;  Oncological/hematological History:  No history exists.    Medical History: Past Medical History:  Diagnosis Date  . Calcaneal spur of both feet 07/18/2016  . Hypertension 07/18/2016  . Obesity 07/18/2016  . Osteoarthritis of both feet 07/18/2016  . Osteoarthritis of both hands 07/18/2016  . 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   .Marland KitchenSLE (systemic lupus erythematosus) ([place] 07/18/2016   Positive ANA, Positive Ro, Positive Smith, Positive RNP, Positive RF, Negative CCP,  Elevated LFTs  . Vitamin D deficiency 07/18/2016    Surgical History: Past Surgical History:  Procedure Laterality Date  . BREAST SURGERY    . EYE SURGERY Bilateral 07/2017   cataracts    Family History: Family History  Problem Relation Age of Onset  . Cancer Brother   . Cancer Maternal Uncle     Social History: Social History   Socioeconomic History  . Marital status: Widowed    Spouse name: Not on file  . Number of children: Not on file  . Years of education: Not on file  . Highest education level: Not on file  Social Needs  . Financial resource strain: Not on file  . Food insecurity - worry: Not on file  . Food insecurity - inability: Not on file  . Transportation needs - medical: Not on file  . Transportation needs - non-medical: Not on file  Occupational History  . Not on file  Tobacco Use  . Smoking status: Former Smoker    Years: 3.00    Types: Cigarettes  . Smokeless tobacco: Former [employer] developer   Types: Chew  Substance and Sexual Activity  . Alcohol use: No  . Drug use: No  . Sexual activity: Not on file  Other Topics Concern  . Not on file  Social History Narrative  . Not on file    Allergies: Allergies  Allergen Reactions  . Plaquenil [Hydroxychloroquine Sulfate]  Nausea Only    Medications:  Current Outpatient Medications  Medication Sig Dispense Refill  . allopurinol (ZYLOPRIM) 300 MG tablet TAKE 1 TABLET(300 MG) BY MOUTH DAILY 90 tablet 3  . amLODipine (NORVASC) 5 MG tablet     . folic acid (FOLVITE) 1 MG tablet TAKE 1 TABLET BY MOUTH EVERY DAY 90 tablet 0  . triamcinolone cream (KENALOG) 0.1 % APP AA BID  2  . triamterene-hydrochlorothiazide (MAXZIDE-25) 37.5-25 MG tablet     . predniSONE (DELTASONE) 5 MG tablet 439mx4 days, 3560m4 days, 31m37m days, 25mg10mdays, 20mg 17mays, 15mg x7mys, 10mg x13mys, 7.5mg x14 56ms, 5mg x14 d74m, 2.5 x14 day 225 tablet 0  . traMADol (ULTRAM) 50 MG tablet daily.      No current facility-administered medications for this visit.     Review of Systems: Review of Systems  All other systems reviewed and are negative.    PHYSICAL EXAMINATION Blood pressure (!) 149/83, pulse 71, temperature 98.3 F (36.8 C), temperature source Oral, resp. rate 18, height _0  (1.626 m), weight 204 lb 8 oz (92.8 kg), SpO2 100 %.  ECOG PERFORMANCE STATUS: 1 - Symptomatic but completely ambulatory  Physical Exam  Constitutional: She is oriented to person, place, and time and well-developed, well-nourished, and in no distress. No distress.  HENT:  Head: Normocephalic and atraumatic.  Mouth/Throat: No oropharyngeal exudate.  Eyes: Conjunctivae and EOM are normal. Pupils are equal, round, and reactive to light. No scleral icterus.  Neck: Normal range of motion. Neck supple. No thyromegaly present.  Cardiovascular: Normal rate, regular rhythm and normal heart sounds. Exam reveals no gallop.  No murmur heard. Pulmonary/Chest: Effort normal and breath sounds normal. She has no wheezes.  Abdominal: Soft. Bowel sounds are normal. She exhibits no distension and no mass. There is no tenderness. There is no rebound and no guarding.  Musculoskeletal: She exhibits tenderness. She exhibits no edema or deformity.  Lymphadenopathy:     She has no cervical adenopathy.  Neurological: She is alert and oriented to person, place, and time. She has normal reflexes.  Skin: Skin is warm and dry. She is not diaphoretic. No erythema.     LABORATORY DATA: I have personally reviewed the data as listed: No visits with results within 1 Week(s) from this visit.  Latest known visit with results is:  Appointment on 10/03/2017  Component Date Value Ref Range Status  . WBC 10/03/2017 3.4* 3.9 - 10.3 K/uL Final  . RBC 10/03/2017 4.19  3.70 - 5.45 MIL/uL Final  . Hemoglobin 10/03/2017 13.7  11.6 - 15.9 g/dL Final  . HCT 10/03/2017 41.6  34.8 - 46.6 % Final  . MCV 10/03/2017 99.2  79.5 - 101.0 fL Final  . MCH 10/03/2017 32.8  25.1 - 34.0 pg Final  . MCHC 10/03/2017 33.1  31.5 - 36.0 g/dL Final  . RDW 10/03/2017 14.3  11.2 - 16.1 % Final  . Platelets 10/03/2017 174  145 - 400 K/uL Final  . Neutrophils Relative % 10/03/2017 56  % Final  . Neutro Abs 10/03/2017 1.9  1.5 - 6.5 K/uL Final  . Lymphocytes Relative 10/03/2017 30  % Final  . Lymphs Abs 10/03/2017 1.0  0.9 - 3.3 K/uL Final  . Monocytes Relative 10/03/2017 13  % Final  . Monocytes Absolute 10/03/2017 0.4  0.1 - 0.9 K/uL Final  . Eosinophils Relative 10/03/2017 1  % Final  . Eosinophils Absolute 10/03/2017 0.0  0.0 - 0.5 K/uL Final  . Basophils Relative 10/03/2017 0  % Final  . Basophils Absolute 10/03/2017 0.0  0.0 - 0.1 K/uL Final   Performed at [facility], [place] [address], [place], [place] [address]  . Sodium 10/03/2017 138  136 - 145 mmol/L Final  . Potassium 10/03/2017 4.0  3.3 - 4.7 mmol/L Final  . Chloride 10/03/2017 102  98 - 109 mmol/L Final  . CO2 10/03/2017 28  22 - 29 mmol/L Final  . Glucose, Bld 10/03/2017 94  70 - 140 mg/dL Final  . BUN 10/03/2017 9  7 - 26 mg/dL Final  . Creatinine, Ser 10/03/2017 0.74  0.60 - 1.10 mg/dL Final  . Calcium 10/03/2017 9.6  8.4 - 10.4 mg/dL Final  . Total Protein 10/03/2017 8.8* 6.4 - 8.3 g/dL Final   . Albumin 10/03/2017 3.7  3.5 - 5.0 g/dL Final  . AST  10/03/2017 342* 5 - 34 U/L Final   Comment: REPEATED TO VERIFY Successful Call: AST called 10/03/2017 11:25 AM to [place] ([phone]/KRISTIN SWINDLE, RN) by [id number]. Read Back: Yes   . ALT 10/03/2017 202* 0 - 55 U/L Final  . Alkaline Phosphatase 10/03/2017 106  40 - 150 U/L Final  . Total Bilirubin 10/03/2017 0.9  0.2 - 1.2 mg/dL Final  . GFR calc non Af Amer 10/03/2017 >60  >60 mL/min Final  . GFR calc Af Amer 10/03/2017 >60  >60 mL/min Final   Comment: (NOTE) The eGFR has been calculated using the CKD EPI equation. This calculation has not been validated in all clinical situations. eGFR's persistently <60 mL/min signify possible Chronic Kidney Disease.   Georgiann Hahn gap 10/03/2017 8  3 - 11 Final   Performed at [facility], [place] [address] [place], [place] [address]  . Uric Acid, Serum 10/03/2017 3.8  2.6 - 7.4 mg/dL Final   Performed at [facility], [place] [address], [place], [place] [address]  . IgG (Immunoglobin G), Serum 10/03/2017 2,709* 700 - 1,600 mg/dL Final  . IgA 10/03/2017 366  64 - 422 mg/dL Final  . IgM (Immunoglobulin M), Srm 10/03/2017 30  26 - 217 mg/dL Final  . Total Protein ELP 10/03/2017 7.8  6.0 - 8.5 g/dL Corrected  . Albumin SerPl Elph-Mcnc 10/03/2017 3.3  2.9 - 4.4 g/dL Corrected  . Alpha 1 10/03/2017 0.3  0.0 - 0.4 g/dL Corrected  . Alpha2 Glob SerPl Elph-Mcnc 10/03/2017 0.8  0.4 - 1.0 g/dL Corrected  . B-Globulin SerPl Elph-Mcnc 10/03/2017 1.3  0.7 - 1.3 g/dL Corrected  . Gamma Glob SerPl Elph-Mcnc 10/03/2017 2.1* 0.4 - 1.8 g/dL Corrected  . M Protein SerPl Elph-Mcnc 10/03/2017 0.7* Not Observed g/dL Corrected  . Globulin, Total 10/03/2017 4.5* 2.2 - 3.9 g/dL Corrected  . Albumin/Glob SerPl 10/03/2017 0.8  0.7 - 1.7 Corrected  . IFE 1 10/03/2017 Comment   Corrected   Comment: (NOTE) Immunofixation shows IgG monoclonal protein with lambda light  chain specificity. An apparent polyclonal gammopathy: IgG. Kappa and lambda typing appear increased.   . Please Note 10/03/2017 Comment   Corrected   Comment: (NOTE) Protein electrophoresis scan will follow via computer, mail, or courier delivery. Performed At: [facility] [place], [place] [id number] Rush Farmer MD [id number] Performed at [facility], [place] [address], [place], [place] [address]   . Kappa free light chain 10/03/2017 42.1* 3.3 - 19.4 mg/L Final  . Lamda free light chains 10/03/2017 30.3* 5.7 - 26.3 mg/L Final  . Kappa, lamda light chain ratio 10/03/2017 1.39  0.26 - 1.65 Final   Comment: (NOTE) Performed At: [facility] [place], [place] [id number] Rush Farmer MD [id number] Performed at [facility], [place] [address] [place], [place] [address]   . Beta-2 Microglobulin 10/03/2017 2.1  0.6 - 2.4 mg/L Final   Comment: (NOTE) Siemens Immulite 2000 Immunochemiluminometric assay (ICMA) Values obtained with different assay methods or kits cannot be used interchangeably. Results cannot be interpreted as absolute evidence of the presence or absence of malignant disease. Performed At: [facility] [place], [place] [id number] Rush Farmer MD [id number] Performed at [facility], [place] [address] [place], [place] [address]   . LDH 10/03/2017 338* 125 - 245 U/L Final   Performed at [facility], [place] [address], [place], [place] [address]  Ardath Sax, MD

## 2017-12-04 NOTE — Progress Notes (Signed)
Office Visit Note  Patient: Deanna Hart             Date of Birth: 26-Feb-1941           MRN: [id number]             PCP: [name], MD Referring: [name], MD Visit Date: 12/13/2017 Occupation: @GUAROCC @    Subjective:  Pain in the joints.   History of Present Illness: Deanna Hart is a 77 y.o. female with history of systemic lupus and osteoarthritis.  She was started on prednisone due to elevated LFTs last visit.  She had been tapering prednisone and finished about 3 weeks ago.  The plan was to start her on Imuran after her LFTs normalized.  Her last LFTs were done about a month ago.  She states she continues to have some discomfort in her joints especially her knee joints.  Although the knee joint discomfort improved while she was taking prednisone.   Activities of Daily Living:  Patient reports morning stiffness for 30 minutes.   Patient Denies nocturnal pain.  Difficulty dressing/grooming: Denies Difficulty climbing stairs: Denies Difficulty getting out of chair: Reports Difficulty using hands for taps, buttons, cutlery, and/or writing: Reports   Review of Systems  Constitutional: Positive for fatigue. Negative for night sweats, weight gain and weight loss.  HENT: Negative for mouth sores, trouble swallowing, trouble swallowing, mouth dryness and nose dryness.   Eyes: Positive for dryness. Negative for pain, redness and visual disturbance.  Respiratory: Negative for cough, shortness of breath and difficulty breathing.   Cardiovascular: Negative for chest pain, palpitations, hypertension, irregular heartbeat and swelling in legs/feet.  Gastrointestinal: Negative for abdominal pain, blood in stool, constipation and diarrhea.  Endocrine: Negative for increased urination.  Genitourinary: Negative for pelvic pain and vaginal dryness.  Musculoskeletal: Positive for arthralgias, joint pain and morning stiffness. Negative for joint swelling, myalgias,  muscle weakness, muscle tenderness and myalgias.  Skin: Negative for color change, rash, hair loss, redness, skin tightness, ulcers and sensitivity to sunlight.  Allergic/Immunologic: Negative for susceptible to infections.  Neurological: Negative for dizziness, headaches, memory loss, night sweats and weakness.  Hematological: Negative for bruising/bleeding tendency and swollen glands.  Psychiatric/Behavioral: Negative for depressed mood and sleep disturbance. The patient is not nervous/anxious.     PMFS History:  Patient Active Problem List   Diagnosis Date Noted  . MGUS (monoclonal gammopathy of unknown significance) 06/01/2017  . Hyperuricemia 06/01/2017  . Elevated LFTs 03/03/2017  . Acute pain of right shoulder 11/28/2016  . High risk medications (not anticoagulants) long-term use 07/19/2016  . Rheumatoid factor positive 07/19/2016  . SLE (systemic lupus erythematosus) ([place]) 07/18/2016  . Primary osteoarthritis of both hands 07/18/2016  . Primary osteoarthritis of both feet 07/18/2016  . Calcaneal spur of both feet 07/18/2016  . Osteoarthritis of both knees 07/18/2016  . Hypertension 07/18/2016  . Obesity 07/18/2016  . Vitamin D deficiency 07/18/2016    Past Medical History:  Diagnosis Date  . Calcaneal spur of both feet 07/18/2016  . Hypertension 07/18/2016  . Obesity 07/18/2016  . Osteoarthritis of both feet 07/18/2016  . Osteoarthritis of both hands 07/18/2016  . 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   Marland Kitchen SLE (systemic lupus erythematosus) ([place]) 07/18/2016   Positive ANA, Positive Ro, Positive Smith, Positive RNP, Positive RF, Negative CCP,  Elevated LFTs  . Vitamin D deficiency 07/18/2016    Family History  Problem Relation Age of Onset  .  Cancer Brother   . Cancer Maternal Uncle    Past Surgical History:  Procedure Laterality Date  . BREAST SURGERY    . EYE SURGERY Bilateral 07/2017   cataracts   Social History   Social  History Narrative  . Not on file     Objective: Vital Signs: BP (!) 145/91 (BP Location: Left Arm, Patient Position: Sitting, Cuff Size: Normal)   Pulse 83   Resp 16   Ht 5\' 4"  (1.626 m)   Wt 204 lb (92.5 kg)   BMI 35.02 kg/m    Physical Exam  Constitutional: She is oriented to person, place, and time. She appears well-developed and well-nourished.  HENT:  Head: Normocephalic and atraumatic.  Eyes: Conjunctivae and EOM are normal.  Neck: Normal range of motion.  Cardiovascular: Normal rate, regular rhythm, normal heart sounds and intact distal pulses.  Pulmonary/Chest: Effort normal and breath sounds normal.  Abdominal: Soft. Bowel sounds are normal.  Lymphadenopathy:    She has no cervical adenopathy.  Neurological: She is alert and oriented to person, place, and time.  Skin: Skin is warm and dry. Capillary refill takes less than 2 seconds.  Psychiatric: She has a normal mood and affect. Her behavior is normal.  Nursing note and vitals reviewed.    Musculoskeletal Exam: C-spine thoracic and lumbar spine good range of motion.  Shoulder joints, elbow joints, wrist joints, MCPs PIPs and DIPs were in good range of motion with no synovitis.  Hip joints were distant difficult to assess.  She has some warmth on palpation of her left knee joint.  No effusion was noted.   CDAI Exam: No CDAI exam completed.    Investigation: No additional findings. CBC Latest Ref Rng & Units 10/25/2017 10/03/2017 05/30/2017  WBC 3.8 - 10.8 Thousand/uL 7.2 3.4(L) 3.7(L)  Hemoglobin 11.7 - 15.5 g/dL 13.0 13.7 13.1  Hematocrit 35.0 - 45.0 % 38.9 41.6 38.9  Platelets 140 - 400 Thousand/uL 151 174 178   CMP Latest Ref Rng & Units 10/25/2017 10/03/2017 07/11/2017  Glucose 65 - 99 mg/dL 116(H) 94 -  BUN 7 - 25 mg/dL 11 9 -  Creatinine 0.60 - 0.93 mg/dL 0.65 0.74 -  Sodium 135 - 146 mmol/L 141 138 -  Potassium 3.5 - 5.3 mmol/L 4.5 4.0 -  Chloride 98 - 110 mmol/L 104 102 -  CO2 20 - 32 mmol/L 32 28 -    Calcium 8.6 - 10.4 mg/dL 9.5 9.6 -  Total Protein 6.1 - 8.1 g/dL 6.9 8.8(H) 8.3  Total Bilirubin 0.2 - 1.2 mg/dL 0.6 0.9 0.6  Alkaline Phos 40 - 150 U/L - 106 88  AST 10 - 35 U/L 62(H) 342(HH) 128(H)  ALT 6 - 29 U/L 66(H) 202(H) 83(H)    Imaging: No results found.  Speciality Comments: No specialty comments available.    Procedures:  No procedures performed Allergies: Plaquenil [hydroxychloroquine sulfate]   Assessment / Plan:     Visit Diagnoses: Other systemic lupus erythematosus with other organ involvement (HCC) - +ANA +Ro +Smith +RNP + RF neg CCP / h/o skin bipsy Dr Ronnald Ramp + biopsy -she has no rash on examination.  She has no synovitis on examination.  Plan: C3 and C4, RNP Antibody, Anti-Smith antibody, Sjogrens syndrome-A extractable nuclear antibody, Anti-DNA antibody, double-stranded, C3 and C4  High risk medication use - Methotrexate was discontinued due to nausea vomiting and fatigue. The records review show that she had nausea and dizziness with Plaquenil use in 2016 - Plan: COMPLETE METABOLIC  PANEL WITH GFR, CBC with Differential/Platelet  Elevated LFTs - She has chronic elevation of liver functions.on prednisone therapy.  She was a started on prednisone taper and she was supposed to have labs every month.  She had only initial labs done tapered off prednisone did not come for repeat labs.  He is off prednisone for 3 weeks now.  I will check her liver functions today.  If her LFTs are still elevated we may have to do another prednisone taper followed by Imuran as planned.  She was also referred to gastroenterology.  We call the office today and she went only for initial visit and did not follow-up with them.  I will obtain her labs today.  We will see her back in 2 weeks in case we have to put her back on prednisone.  Primary osteoarthritis of both hands: She does not have much discomfort in her hands today.  Primary osteoarthritis of both knees.:  She continues to have  discomfort in her knee joints with some warmth in her left knee.  Primary osteoarthritis of both feet  Calcaneal spur of both feet  MGUS (monoclonal gammopathy of unknown significance) - She's been followed up by hematology.  History of hypertension: She continues to have elevated blood pressure.  I have advised her to follow-up with her PCP.  History of vitamin D deficiency    Orders: Orders Placed This Encounter  Procedures  . COMPLETE METABOLIC PANEL WITH GFR  . CBC with Differential/Platelet  . C3 and C4  . RNP Antibody  . Anti-Smith antibody  . Sjogrens syndrome-A extractable nuclear antibody  . Anti-DNA antibody, double-stranded  . C3 and C4   No orders of the defined types were placed in this encounter.   Face-to-face time spent with patient was 30 minutes.  Greater than 50% of time was spent in counseling and coordination of care.  Follow-Up Instructions: Return in about 2 weeks (around 12/27/2017) for Autoimmune disease.   Bo Merino, MD  Note - This record has been created using Editor, commissioning.  Chart creation errors have been sought, but may not always  have been located. Such creation errors do not reflect on  the standard of medical care.

## 2017-12-13 ENCOUNTER — Other Ambulatory Visit: Payer: Self-pay | Admitting: Rheumatology

## 2017-12-13 ENCOUNTER — Encounter: Payer: Self-pay | Admitting: Rheumatology

## 2017-12-13 ENCOUNTER — Ambulatory Visit (INDEPENDENT_AMBULATORY_CARE_PROVIDER_SITE_OTHER): Payer: Medicare HMO | Admitting: Rheumatology

## 2017-12-13 VITALS — BP 145/91 | HR 83 | Resp 16 | Ht 64.0 in | Wt 204.0 lb

## 2017-12-13 DIAGNOSIS — M19072 Primary osteoarthritis, left ankle and foot: Secondary | ICD-10-CM | POA: Diagnosis not present

## 2017-12-13 DIAGNOSIS — R7989 Other specified abnormal findings of blood chemistry: Secondary | ICD-10-CM

## 2017-12-13 DIAGNOSIS — Z79899 Other long term (current) drug therapy: Secondary | ICD-10-CM

## 2017-12-13 DIAGNOSIS — M19071 Primary osteoarthritis, right ankle and foot: Secondary | ICD-10-CM | POA: Diagnosis not present

## 2017-12-13 DIAGNOSIS — M7731 Calcaneal spur, right foot: Secondary | ICD-10-CM

## 2017-12-13 DIAGNOSIS — M19041 Primary osteoarthritis, right hand: Secondary | ICD-10-CM

## 2017-12-13 DIAGNOSIS — M19042 Primary osteoarthritis, left hand: Secondary | ICD-10-CM

## 2017-12-13 DIAGNOSIS — M3219 Other organ or system involvement in systemic lupus erythematosus: Secondary | ICD-10-CM

## 2017-12-13 DIAGNOSIS — D472 Monoclonal gammopathy: Secondary | ICD-10-CM

## 2017-12-13 DIAGNOSIS — Z8679 Personal history of other diseases of the circulatory system: Secondary | ICD-10-CM

## 2017-12-13 DIAGNOSIS — M170 Bilateral primary osteoarthritis of knee: Secondary | ICD-10-CM | POA: Diagnosis not present

## 2017-12-13 DIAGNOSIS — R945 Abnormal results of liver function studies: Secondary | ICD-10-CM

## 2017-12-13 DIAGNOSIS — Z8639 Personal history of other endocrine, nutritional and metabolic disease: Secondary | ICD-10-CM

## 2017-12-13 DIAGNOSIS — M7732 Calcaneal spur, left foot: Secondary | ICD-10-CM

## 2017-12-13 LAB — CBC WITH DIFFERENTIAL/PLATELET
Basophils Absolute: 32 {cells}/uL (ref 0–200)
Basophils Relative: 0.6 %
Eosinophils Absolute: 38 {cells}/uL (ref 15–500)
Eosinophils Relative: 0.7 %
HCT: 39.2 % (ref 35.0–45.0)
Hemoglobin: 13.6 g/dL (ref 11.7–15.5)
Lymphs Abs: 1409 {cells}/uL (ref 850–3900)
MCH: 33.2 pg — ABNORMAL HIGH (ref 27.0–33.0)
MCHC: 34.7 g/dL (ref 32.0–36.0)
MCV: 95.6 fL (ref 80.0–100.0)
MPV: 11.5 fL (ref 7.5–12.5)
Monocytes Relative: 14.3 %
Neutro Abs: 3148 {cells}/uL (ref 1500–7800)
Neutrophils Relative %: 58.3 %
Platelets: 207 Thousand/uL (ref 140–400)
RBC: 4.1 Million/uL (ref 3.80–5.10)
RDW: 13.6 % (ref 11.0–15.0)
Total Lymphocyte: 26.1 %
WBC mixed population: 772 {cells}/uL (ref 200–950)
WBC: 5.4 Thousand/uL (ref 3.8–10.8)

## 2017-12-13 NOTE — Progress Notes (Signed)
Office Visit Note  Patient: Deanna Hart             Date of Birth: [birth year]           MRN: [id number]             PCP: [name], MD Referring: [name], MD Visit Date: 12/27/2017 Occupation: @GUAROCC @    Subjective:  Medication monitoring    History of Present Illness: Deanna Hart is a 77 y.o. female with history of systemic lupus erythematosus and osteoarthritis.  Patient reports she has been off prednisone for about 1 month.  She is no longer on methotrexate due to it causing nausea and vomiting.  She denies any changes since her last visit.  She denies any sores in her mouth or nose.  She denies any swollen lymph nodes.  She has any joint pain or joint swelling at this time.  She denies any recent rashes.  She denies any recent fevers or worsening fatigue.  She denies any sicca symptoms.  She denies symptoms of Raynaud's.  She reports she noticed an area of redness on the inner aspect of her left leg.  She states the area is tender and red.  She states she has been putting alcohol on this region.  She sates that redness has improved.  She denies any fevers or chills.  She denies recalling an insect bite.  She denies any injuries.     Activities of Daily Living:  Patient reports morning stiffness for 0 minutes.   Patient Denies nocturnal pain.  Difficulty dressing/grooming: Denies Difficulty climbing stairs: Denies Difficulty getting out of chair: Denies Difficulty using hands for taps, buttons, cutlery, and/or writing: Denies   Review of Systems  Constitutional: Negative for fatigue.  HENT: Negative for mouth sores, mouth dryness and nose dryness.   Eyes: Positive for dryness. Negative for pain and visual disturbance.  Respiratory: Negative for cough, hemoptysis, shortness of breath and difficulty breathing.   Cardiovascular: Negative for chest pain, palpitations, hypertension and swelling in legs/feet.  Gastrointestinal: Negative for blood in  stool, constipation and diarrhea.  Endocrine: Negative for increased urination.  Genitourinary: Negative for painful urination.  Musculoskeletal: Negative for arthralgias, joint pain, joint swelling, myalgias, muscle weakness, morning stiffness, muscle tenderness and myalgias.  Skin: Positive for redness. Negative for color change, pallor, rash, hair loss, nodules/bumps, skin tightness, ulcers and sensitivity to sunlight.  Allergic/Immunologic: Negative for susceptible to infections.  Neurological: Negative for dizziness, numbness, headaches and weakness.  Hematological: Negative for swollen glands.  Psychiatric/Behavioral: Negative for depressed mood and sleep disturbance. The patient is not nervous/anxious.     PMFS History:  Patient Active Problem List   Diagnosis Date Noted  . MGUS (monoclonal gammopathy of unknown significance) 06/01/2017  . Hyperuricemia 06/01/2017  . Elevated LFTs 03/03/2017  . Acute pain of right shoulder 11/28/2016  . High risk medications (not anticoagulants) long-term use 07/19/2016  . Rheumatoid factor positive 07/19/2016  . SLE (systemic lupus erythematosus) ([employer]) 07/18/2016  . Primary osteoarthritis of both hands 07/18/2016  . Primary osteoarthritis of both feet 07/18/2016  . Calcaneal spur of both feet 07/18/2016  . Osteoarthritis of both knees 07/18/2016  . Hypertension 07/18/2016  . Obesity 07/18/2016  . Vitamin D deficiency 07/18/2016    Past Medical History:  Diagnosis Date  . Calcaneal spur of both feet 07/18/2016  . Hypertension 07/18/2016  . Obesity 07/18/2016  . Osteoarthritis of both feet 07/18/2016  . Osteoarthritis of both hands 07/18/2016  .  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   Marland Kitchen SLE (systemic lupus erythematosus) ([place]) 07/18/2016   Positive ANA, Positive Ro, Positive Smith, Positive RNP, Positive RF, Negative CCP,  Elevated LFTs  . Vitamin D deficiency 07/18/2016    Family History  Problem  Relation Age of Onset  . Cancer Brother   . Cancer Maternal Uncle    Past Surgical History:  Procedure Laterality Date  . BREAST SURGERY    . EYE SURGERY Bilateral 07/2017   cataracts   Social History   Social History Narrative  . Not on file     Objective: Vital Signs: BP 130/80 (BP Location: Left Arm, Patient Position: Sitting, Cuff Size: Normal)   Pulse 84   Resp 16   Ht 5\' 4"  (1.626 m)   Wt 207 lb (93.9 kg)   BMI 35.53 kg/m    Physical Exam  Constitutional: She is oriented to person, place, and time. She appears well-developed and well-nourished.  HENT:  Head: Normocephalic and atraumatic.  Eyes: Conjunctivae and EOM are normal.  Neck: Normal range of motion.  Cardiovascular: Normal rate, regular rhythm, normal heart sounds and intact distal pulses.  Pulmonary/Chest: Effort normal and breath sounds normal.  Abdominal: Soft. Bowel sounds are normal.  Lymphadenopathy:    She has no cervical adenopathy.  Neurological: She is alert and oriented to person, place, and time.  Skin: Skin is warm and dry. Capillary refill takes less than 2 seconds.  Psychiatric: She has a normal mood and affect. Her behavior is normal.  Nursing note and vitals reviewed.    Musculoskeletal Exam: C-spine, thoracic spine, lumbar spine good ROM.  No midline spinal tenderness.  No SI joint tenderness.  Shoulder joints, elbow joints, wrist joints, MCPs, PIPs, and DIPs good ROM with no synovitis.  Hip joints, knee joints, ankle joints, MTPs, PIPs, and DIPs good ROM with no synovitis.  No warmth or effusion of knees.  No tenderness of trochanteric bursa.   CDAI Exam: No CDAI exam completed.    Investigation: No additional findings.  CBC Latest Ref Rng & Units 10/25/2017 10/03/2017 05/30/2017  WBC 3.8 - 10.8 Thousand/uL 7.2 3.4(L) 3.7(L)  Hemoglobin 11.7 - 15.5 g/dL 13.0 13.7 13.1  Hematocrit 35.0 - 45.0 % 38.9 41.6 38.9  Platelets 140 - 400 Thousand/uL 151 174 178   CMP Latest Ref Rng & Units  10/25/2017 10/03/2017 07/11/2017  Glucose 65 - 99 mg/dL 116(H) 94 -  BUN 7 - 25 mg/dL 11 9 -  Creatinine 0.60 - 0.93 mg/dL 0.65 0.74 -  Sodium 135 - 146 mmol/L 141 138 -  Potassium 3.5 - 5.3 mmol/L 4.5 4.0 -  Chloride 98 - 110 mmol/L 104 102 -  CO2 20 - 32 mmol/L 32 28 -  Calcium 8.6 - 10.4 mg/dL 9.5 9.6 -  Total Protein 6.1 - 8.1 g/dL 6.9 8.8(H) 8.3  Total Bilirubin 0.2 - 1.2 mg/dL 0.6 0.9 0.6  Alkaline Phos 40 - 150 U/L - 106 88  AST 10 - 35 U/L 62(H) 342(HH) 128(H)  ALT 6 - 29 U/L 66(H) 202(H) 83(H)   Imaging: No results found.  Speciality Comments: No specialty comments available.    Procedures:  No procedures performed Allergies: Plaquenil [hydroxychloroquine sulfate]   Assessment / Plan:     Visit Diagnoses: Other systemic lupus erythematosus with other organ involvement ([facility]) - +ANA +Ro +Smith +RNP + RF neg CCP / h/o skin bipsy Dr Ronnald Ramp + biopsy: She has no synovitis  on exam today.  She has no rashes, oral or nasal ulcers, or cervical lymphadenopathy palpated.  She has not had any recent flares of her lupus.  High risk medications (not anticoagulants) long-term use - Methotrexate was discontinued due to nausea, vomiting, and fatigue. The records review show that she had nausea and dizziness with Plaquenil use in 2016.  Elevated LFTs -She has chronic elevations of LFTs. She has been off prednisone for about 1 month.  Liver functions were checked at her last visit on 12/13/2017 while she was off of Prednisone and AST 143 and ALT 69.  We are going to restart her on prednisone 40 mg daily.  A prescription for prednisone was sent to the pharmacy.  We will see her back in 2 weeks.  An appointment for her was scheduled with GI.  We will discuss starting Imuran once LFTs decrease.  We will order TPMT and repeat LFTs at next visit.  Primary osteoarthritis of both hands: She has no discomfort at this time.  Primary osteoarthritis of both knees: She has good range of motion on exam.  She  has no discomfort in her bilateral knees at this time.  Primary osteoarthritis of both feet: She wears proper fitting shoes.  She has no discomfort at this time.   Calcaneal spur of both feet  Other medical conditions are listed as follows:  MGUS (monoclonal gammopathy of unknown significance)  Essential hypertension  Vitamin D deficiency  Hyperuricemia    Orders: No orders of the defined types were placed in this encounter.  No orders of the defined types were placed in this encounter.    Follow-Up Instructions: Return in about 2 weeks (around 01/10/2018) for Systemic lupus erythematosus, Osteoarthritis.   [name], PA-C   I examined and evaluated the patient with Hazel Sams PA.  Patient's LFTs have gone up since she is to stop her prednisone.  We will restart her on prednisone 40 mg p.o. daily.  We will also schedule her to see gastroenterologist.  Once her LFTs come down we can add Imuran.  The plan of care was discussed as noted above.  Bo Merino, MD  Note - This record has been created using Editor, commissioning.  Chart creation errors have been sought, but may not always  have been located. Such creation errors do not reflect on  the standard of medical care.

## 2017-12-14 LAB — CBC WITH DIFFERENTIAL/PLATELET
BASOS PCT: 0.6 %
Basophils Absolute: 32 cells/uL (ref 0–200)
Eosinophils Absolute: 38 cells/uL (ref 15–500)
Eosinophils Relative: 0.7 %
HCT: 39.2 % (ref 35.0–45.0)
Hemoglobin: 13.6 g/dL (ref 11.7–15.5)
Lymphs Abs: 1409 cells/uL (ref 850–3900)
MCH: 33.2 pg — ABNORMAL HIGH (ref 27.0–33.0)
MCHC: 34.7 g/dL (ref 32.0–36.0)
MCV: 95.6 fL (ref 80.0–100.0)
MONOS PCT: 14.3 %
MPV: 11.5 fL (ref 7.5–12.5)
NEUTROS PCT: 58.3 %
Neutro Abs: 3148 cells/uL (ref 1500–7800)
PLATELETS: 207 10*3/uL (ref 140–400)
RBC: 4.1 10*6/uL (ref 3.80–5.10)
RDW: 13.6 % (ref 11.0–15.0)
Total Lymphocyte: 26.1 %
WBC mixed population: 772 cells/uL (ref 200–950)
WBC: 5.4 10*3/uL (ref 3.8–10.8)

## 2017-12-14 LAB — C3 AND C4
C3 COMPLEMENT: 209 mg/dL — AB (ref 83–193)
C4 Complement: 38 mg/dL (ref 15–57)

## 2017-12-14 LAB — COMPLETE METABOLIC PANEL WITH GFR
AG RATIO: 1 (calc) (ref 1.0–2.5)
ALBUMIN MSPROF: 3.9 g/dL (ref 3.6–5.1)
ALKALINE PHOSPHATASE (APISO): 67 U/L (ref 33–130)
ALT: 69 U/L — ABNORMAL HIGH (ref 6–29)
AST: 143 U/L — AB (ref 10–35)
BILIRUBIN TOTAL: 0.8 mg/dL (ref 0.2–1.2)
BUN / CREAT RATIO: 20 (calc) (ref 6–22)
BUN: 11 mg/dL (ref 7–25)
CHLORIDE: 100 mmol/L (ref 98–110)
CO2: 29 mmol/L (ref 20–32)
Calcium: 9.4 mg/dL (ref 8.6–10.4)
Creat: 0.55 mg/dL — ABNORMAL LOW (ref 0.60–0.93)
GFR, Est African American: 105 mL/min/{1.73_m2} (ref >=60)
GFR, Est Non African American: 90 mL/min/{1.73_m2} (ref >=60)
GLOBULIN: 3.8 g/dL — AB (ref 1.9–3.7)
Glucose, Bld: 100 mg/dL — ABNORMAL HIGH (ref 65–99)
POTASSIUM: 3.6 mmol/L (ref 3.5–5.3)
Sodium: 136 mmol/L (ref 135–146)
Total Protein: 7.7 g/dL (ref 6.1–8.1)

## 2017-12-14 LAB — SJOGRENS SYNDROME-A EXTRACTABLE NUCLEAR ANTIBODY: SSA (RO) (ENA) ANTIBODY, IGG: POSITIVE AI — AB

## 2017-12-14 LAB — ANTI-DNA ANTIBODY, DOUBLE-STRANDED: ds DNA Ab: 2 IU/mL

## 2017-12-14 LAB — RNP ANTIBODY: Ribonucleic Protein(ENA) Antibody, IgG: <1 AI

## 2017-12-14 LAB — ANTI-SMITH ANTIBODY: ENA SM Ab Ser-aCnc: <1 AI

## 2017-12-18 ENCOUNTER — Ambulatory Visit: Payer: Medicare HMO | Admitting: Hematology and Oncology

## 2017-12-27 ENCOUNTER — Encounter: Payer: Self-pay | Admitting: Physician Assistant

## 2017-12-27 ENCOUNTER — Ambulatory Visit (INDEPENDENT_AMBULATORY_CARE_PROVIDER_SITE_OTHER): Payer: Medicare HMO | Admitting: Rheumatology

## 2017-12-27 VITALS — BP 130/80 | HR 84 | Resp 16 | Ht 64.0 in | Wt 207.0 lb

## 2017-12-27 DIAGNOSIS — R945 Abnormal results of liver function studies: Secondary | ICD-10-CM | POA: Diagnosis not present

## 2017-12-27 DIAGNOSIS — E790 Hyperuricemia without signs of inflammatory arthritis and tophaceous disease: Secondary | ICD-10-CM | POA: Diagnosis not present

## 2017-12-27 DIAGNOSIS — D472 Monoclonal gammopathy: Secondary | ICD-10-CM

## 2017-12-27 DIAGNOSIS — M7731 Calcaneal spur, right foot: Secondary | ICD-10-CM | POA: Diagnosis not present

## 2017-12-27 DIAGNOSIS — M19071 Primary osteoarthritis, right ankle and foot: Secondary | ICD-10-CM

## 2017-12-27 DIAGNOSIS — M19042 Primary osteoarthritis, left hand: Secondary | ICD-10-CM | POA: Diagnosis not present

## 2017-12-27 DIAGNOSIS — Z79899 Other long term (current) drug therapy: Secondary | ICD-10-CM | POA: Diagnosis not present

## 2017-12-27 DIAGNOSIS — M3219 Other organ or system involvement in systemic lupus erythematosus: Secondary | ICD-10-CM

## 2017-12-27 DIAGNOSIS — R7989 Other specified abnormal findings of blood chemistry: Secondary | ICD-10-CM

## 2017-12-27 DIAGNOSIS — E559 Vitamin D deficiency, unspecified: Secondary | ICD-10-CM

## 2017-12-27 DIAGNOSIS — M19041 Primary osteoarthritis, right hand: Secondary | ICD-10-CM

## 2017-12-27 DIAGNOSIS — M7732 Calcaneal spur, left foot: Secondary | ICD-10-CM

## 2017-12-27 DIAGNOSIS — M170 Bilateral primary osteoarthritis of knee: Secondary | ICD-10-CM

## 2017-12-27 DIAGNOSIS — I10 Essential (primary) hypertension: Secondary | ICD-10-CM

## 2017-12-27 DIAGNOSIS — M19072 Primary osteoarthritis, left ankle and foot: Secondary | ICD-10-CM

## 2017-12-27 MED ORDER — PREDNISONE 10 MG PO TABS: ORAL_TABLET | ORAL | 0 refills | Status: DC

## 2017-12-28 NOTE — Progress Notes (Signed)
Office Visit Note  Patient: Deanna Hart             Date of Birth: 1941-07-03           MRN: [id number]             PCP: [name], MD Referring: [name], MD Visit Date: 01/10/2018 Occupation: @GUAROCC @    Subjective:  Medication management.   History of Present Illness: Deanna Hart is a 77 y.o. female with history of systemic lupus erythematosus and elevated LFTs.  She has been on prednisone since her last visit.  We increase her prednisone to 40 mg p.o. daily.  She states she went to see Dr. Benson Norway for follow-up visit he advised her to continue on current dose of prednisone  She will have follow-up visit with him next week.  She also had appointment with her oncologist for MGUS and had some recent labs done which are pending.  She is currently not having any symptoms.  She denies any rash.  No joint swelling.  Activities of Daily Living:  Patient reports morning stiffness for  minute.   Patient Denies nocturnal pain.  Difficulty dressing/grooming: Denies Difficulty climbing stairs: Denies Difficulty getting out of chair: Denies Difficulty using hands for taps, buttons, cutlery, and/or writing: Denies   Review of Systems  Constitutional: Negative for fatigue, night sweats, weight gain and weight loss.  HENT: Negative for mouth sores, trouble swallowing, trouble swallowing, mouth dryness and nose dryness.   Eyes: Negative for pain, redness, visual disturbance and dryness.  Respiratory: Negative for cough, shortness of breath and difficulty breathing.   Cardiovascular: Negative for chest pain, palpitations, hypertension, irregular heartbeat and swelling in legs/feet.  Gastrointestinal: Negative for blood in stool, constipation and diarrhea.  Endocrine: Negative for increased urination.  Genitourinary: Negative for vaginal dryness.  Musculoskeletal: Negative for arthralgias, joint pain, joint swelling, myalgias, muscle weakness, morning stiffness,  muscle tenderness and myalgias.  Skin: Negative for color change, rash, hair loss, skin tightness, ulcers and sensitivity to sunlight.  Allergic/Immunologic: Negative for susceptible to infections.  Neurological: Negative for dizziness, memory loss, night sweats and weakness.  Hematological: Negative for swollen glands.  Psychiatric/Behavioral: Negative for depressed mood and sleep disturbance. The patient is not nervous/anxious.     PMFS History:  Patient Active Problem List   Diagnosis Date Noted  . MGUS (monoclonal gammopathy of unknown significance) 06/01/2017  . Hyperuricemia 06/01/2017  . Elevated LFTs 03/03/2017  . Acute pain of right shoulder 11/28/2016  . High risk medications (not anticoagulants) long-term use 07/19/2016  . Rheumatoid factor positive 07/19/2016  . SLE (systemic lupus erythematosus) ([place]) 07/18/2016  . Primary osteoarthritis of both hands 07/18/2016  . Primary osteoarthritis of both feet 07/18/2016  . Calcaneal spur of both feet 07/18/2016  . Osteoarthritis of both knees 07/18/2016  . Hypertension 07/18/2016  . Obesity 07/18/2016  . Vitamin D deficiency 07/18/2016    Past Medical History:  Diagnosis Date  . Calcaneal spur of both feet 07/18/2016  . Hypertension 07/18/2016  . Obesity 07/18/2016  . Osteoarthritis of both feet 07/18/2016  . Osteoarthritis of both hands 07/18/2016  . 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   Marland Kitchen SLE (systemic lupus erythematosus) ([place]) 07/18/2016   Positive ANA, Positive Ro, Positive Smith, Positive RNP, Positive RF, Negative CCP,  Elevated LFTs  . Vitamin D deficiency 07/18/2016    Family History  Problem Relation Age of Onset  . Cancer Brother   .  Cancer Maternal Uncle    Past Surgical History:  Procedure Laterality Date  . BREAST SURGERY    . EYE SURGERY Bilateral 07/2017   cataracts   Social History   Social History Narrative  . Not on file     Objective: Vital Signs:  BP 138/84 (BP Location: Left Arm, Patient Position: Sitting, Cuff Size: Normal)   Pulse 63   Resp 15   Ht 5\' 4"  (1.626 m)   Wt 207 lb 8 oz (94.1 kg)   BMI 35.62 kg/m    Physical Exam  Constitutional: She is oriented to person, place, and time. She appears well-developed and well-nourished.  HENT:  Head: Normocephalic and atraumatic.  Eyes: Conjunctivae and EOM are normal.  Neck: Normal range of motion.  Cardiovascular: Normal rate, regular rhythm, normal heart sounds and intact distal pulses.  Pulmonary/Chest: Effort normal and breath sounds normal.  Abdominal: Soft. Bowel sounds are normal.  Lymphadenopathy:    She has no cervical adenopathy.  Neurological: She is alert and oriented to person, place, and time.  Skin: Skin is warm and dry. Capillary refill takes less than 2 seconds.  Psychiatric: She has a normal mood and affect. Her behavior is normal.  Nursing note and vitals reviewed.    Musculoskeletal Exam: C-spine thoracic lumbar spine good range of motion.  Shoulder joint supple transverse joint MCPs PIPs DIPs but good range of motion.  Hip joints knee joints ankles MTPs PIPs Good Range Of Motion with No Synovitis.  CDAI Exam: No CDAI exam completed.    Investigation: No additional findings. CBC Latest Ref Rng & Units 01/02/2018 12/13/2017 10/25/2017  WBC 3.9 - 10.3 K/uL 7.3 5.4 7.2  Hemoglobin 11.6 - 15.9 g/dL 13.4 13.6 13.0  Hematocrit 34.8 - 46.6 % 39.8 39.2 38.9  Platelets 145 - 400 K/uL 167 207 151   CMP Latest Ref Rng & Units 01/02/2018 12/13/2017 10/25/2017  Glucose 70 - 140 mg/dL 104 100(H) 116(H)  BUN 7 - 26 mg/dL 16 11 11   Creatinine 0.60 - 1.10 mg/dL 0.80 0.55(L) 0.65  Sodium 136 - 145 mmol/L 140 136 141  Potassium 3.5 - 5.1 mmol/L 3.0(LL) 3.6 4.5  Chloride 98 - 109 mmol/L 104 100 104  CO2 22 - 29 mmol/L 29 29 32  Calcium 8.4 - 10.4 mg/dL 9.1 9.4 9.5  Total Protein 6.4 - 8.3 g/dL 7.4 7.7 6.9  Total Bilirubin 0.2 - 1.2 mg/dL 0.7 0.8 0.6  Alkaline Phos 40 -  150 U/L 106 - -  AST 5 - 34 U/L 147(H) 143(H) 62(H)  ALT 0 - 55 U/L 131(H) 69(H) 66(H)    Imaging: No results found.  Speciality Comments: No specialty comments available.    Procedures:  No procedures performed Allergies: Plaquenil [hydroxychloroquine sulfate]   Assessment / Plan:     Visit Diagnoses: Other systemic lupus erythematosus with other organ involvement ([place]) - +ANA +Ro +Smith +RNP + RF neg CCP / h/o skin biopsy + by Dr Ronnald Ramp .  Patient is currently asymptomatic she is on prednisone 40 mg daily for elevated LFTs.  She denies any rash.  High risk medications (not anticoagulants) long-term use - Methotrexate was discontinued due to nausea, vomiting, and fatigue. The records review show that she had nausea and dizziness with Plaquenil use in 2016. The plan is to start on Imuran once her LFTs improved.  She will taper prednisone once her LFTs are normalized.  Her LFTs were still elevated drawn at cancer center.  Elevated LFTs - check  LFTs and TPMT next visit.  Primary osteoarthritis of both hands-she has some stiffness but not much discomfort.  Primary osteoarthritis of both knees-doing better.  Primary osteoarthritis of both feet  Calcaneal spur of both feet  MGUS (monoclonal gammopathy of unknown significance)-patient was seen by oncologist recently.  Vitamin D deficiency  Hyperuricemia  Essential hypertension    Orders: No orders of the defined types were placed in this encounter.  No orders of the defined types were placed in this encounter.    Follow-Up Instructions: Return in about 1 month (around 02/09/2018) for SLE, elevated LFTs.   Bo Merino, MD  Note - This record has been created using Editor, commissioning.  Chart creation errors have been sought, but may not always  have been located. Such creation errors do not reflect on  the standard of medical care.

## 2018-01-02 ENCOUNTER — Telehealth: Payer: Self-pay

## 2018-01-02 ENCOUNTER — Inpatient Hospital Stay: Payer: Medicare HMO | Attending: Hematology and Oncology

## 2018-01-02 DIAGNOSIS — I10 Essential (primary) hypertension: Secondary | ICD-10-CM | POA: Insufficient documentation

## 2018-01-02 DIAGNOSIS — E669 Obesity, unspecified: Secondary | ICD-10-CM | POA: Diagnosis not present

## 2018-01-02 DIAGNOSIS — K754 Autoimmune hepatitis: Secondary | ICD-10-CM | POA: Diagnosis not present

## 2018-01-02 DIAGNOSIS — M329 Systemic lupus erythematosus, unspecified: Secondary | ICD-10-CM | POA: Insufficient documentation

## 2018-01-02 DIAGNOSIS — Z87891 Personal history of nicotine dependence: Secondary | ICD-10-CM | POA: Diagnosis not present

## 2018-01-02 DIAGNOSIS — D472 Monoclonal gammopathy: Secondary | ICD-10-CM | POA: Diagnosis not present

## 2018-01-02 DIAGNOSIS — Z79899 Other long term (current) drug therapy: Secondary | ICD-10-CM | POA: Diagnosis not present

## 2018-01-02 DIAGNOSIS — E559 Vitamin D deficiency, unspecified: Secondary | ICD-10-CM | POA: Diagnosis not present

## 2018-01-02 LAB — CBC WITH DIFFERENTIAL (CANCER CENTER ONLY)
Basophils Absolute: 0 10*3/uL (ref 0.0–0.1)
Basophils Relative: 0 %
Eosinophils Absolute: 0 10*3/uL (ref 0.0–0.5)
Eosinophils Relative: 0 %
HEMATOCRIT: 39.8 % (ref 34.8–46.6)
HEMOGLOBIN: 13.4 g/dL (ref 11.6–15.9)
LYMPHS ABS: 2 10*3/uL (ref 0.9–3.3)
Lymphocytes Relative: 28 %
MCH: 33.4 pg (ref 25.1–34.0)
MCHC: 33.7 g/dL (ref 31.5–36.0)
MCV: 99.3 fL (ref 79.5–101.0)
MONOS PCT: 12 %
Monocytes Absolute: 0.9 10*3/uL (ref 0.1–0.9)
NEUTROS PCT: 60 %
Neutro Abs: 4.3 10*3/uL (ref 1.5–6.5)
Platelet Count: 167 10*3/uL (ref 145–400)
RBC: 4.01 MIL/uL (ref 3.70–5.45)
RDW: 14.4 % (ref 11.2–14.5)
WBC: 7.3 10*3/uL (ref 3.9–10.3)

## 2018-01-02 LAB — CMP (CANCER CENTER ONLY)
ALK PHOS: 106 U/L (ref 40–150)
ALT: 131 U/L — AB (ref 0–55)
ANION GAP: 7 (ref 3–11)
AST: 147 U/L — ABNORMAL HIGH (ref 5–34)
Albumin: 3.3 g/dL — ABNORMAL LOW (ref 3.5–5.0)
BILIRUBIN TOTAL: 0.7 mg/dL (ref 0.2–1.2)
BUN: 16 mg/dL (ref 7–26)
CALCIUM: 9.1 mg/dL (ref 8.4–10.4)
CO2: 29 mmol/L (ref 22–29)
CREATININE: 0.8 mg/dL (ref 0.60–1.10)
Chloride: 104 mmol/L (ref 98–109)
GFR, Estimated: >60 mL/min (ref >60)
Glucose, Bld: 104 mg/dL (ref 70–140)
Potassium: 3 mmol/L — CL (ref 3.5–5.1)
SODIUM: 140 mmol/L (ref 136–145)
TOTAL PROTEIN: 7.4 g/dL (ref 6.4–8.3)

## 2018-01-02 LAB — LACTATE DEHYDROGENASE: LDH: 225 U/L (ref 125–245)

## 2018-01-02 NOTE — Telephone Encounter (Signed)
[facility] lab values received from the lab reviewed by Dr. Lebron Conners. No new orders at this time.

## 2018-01-03 LAB — KAPPA/LAMBDA LIGHT CHAINS
Kappa free light chain: 24.1 mg/L — ABNORMAL HIGH (ref 3.3–19.4)
Kappa, lambda light chain ratio: 1.21 (ref 0.26–1.65)
Lambda free light chains: 20 mg/L (ref 5.7–26.3)

## 2018-01-04 DIAGNOSIS — D472 Monoclonal gammopathy: Secondary | ICD-10-CM | POA: Diagnosis not present

## 2018-01-04 LAB — MULTIPLE MYELOMA PANEL, SERUM
Albumin SerPl Elph-Mcnc: 3.1 g/dL (ref 2.9–4.4)
Albumin/Glob SerPl: 0.9 (ref 0.7–1.7)
Alpha 1: 0.2 g/dL (ref 0.0–0.4)
Alpha2 Glob SerPl Elph-Mcnc: 0.8 g/dL (ref 0.4–1.0)
B-GLOBULIN SERPL ELPH-MCNC: 1.2 g/dL (ref 0.7–1.3)
GAMMA GLOB SERPL ELPH-MCNC: 1.5 g/dL (ref 0.4–1.8)
Globulin, Total: 3.7 g/dL (ref 2.2–3.9)
IgA: 284 mg/dL (ref 64–422)
IgG (Immunoglobin G), Serum: 1740 mg/dL — ABNORMAL HIGH (ref 700–1600)
IgM (Immunoglobulin M), Srm: 26 mg/dL (ref 26–217)
M PROTEIN SERPL ELPH-MCNC: 0.4 g/dL — AB
TOTAL PROTEIN ELP: 6.8 g/dL (ref 6.0–8.5)

## 2018-01-05 LAB — UPEP/UIFE/LIGHT CHAINS/TP, 24-HR UR
% BETA, URINE: 0 %
ALBUMIN, U: 100 %
ALPHA 1 URINE: 0 %
ALPHA 2 UR: 0 %
Free Kappa Lt Chains,Ur: 48.1 mg/L — ABNORMAL HIGH (ref 1.35–24.19)
Free Kappa/Lambda Ratio: 11.19 — ABNORMAL HIGH (ref 2.04–10.37)
Free Lambda Lt Chains,Ur: 4.3 mg/L (ref 0.24–6.66)
GAMMA GLOBULIN URINE: 0 %
TOTAL PROTEIN, URINE-UPE24: 13.9 mg/dL
TOTAL PROTEIN, URINE-UR/DAY: 417 mg/(24.h) — AB (ref 30–150)

## 2018-01-09 ENCOUNTER — Encounter: Payer: Self-pay | Admitting: Hematology and Oncology

## 2018-01-09 ENCOUNTER — Telehealth: Payer: Self-pay

## 2018-01-09 ENCOUNTER — Inpatient Hospital Stay (HOSPITAL_BASED_OUTPATIENT_CLINIC_OR_DEPARTMENT_OTHER): Payer: Medicare HMO | Admitting: Hematology and Oncology

## 2018-01-09 VITALS — BP 155/94 | HR 81 | Temp 98.8°F | Resp 18 | Wt 206.0 lb

## 2018-01-09 DIAGNOSIS — D472 Monoclonal gammopathy: Secondary | ICD-10-CM | POA: Diagnosis not present

## 2018-01-09 DIAGNOSIS — Z79899 Other long term (current) drug therapy: Secondary | ICD-10-CM

## 2018-01-09 DIAGNOSIS — K754 Autoimmune hepatitis: Secondary | ICD-10-CM

## 2018-01-09 NOTE — Telephone Encounter (Signed)
Printed avs and calender of upcoming appointment. Per 4/23 los 

## 2018-01-10 ENCOUNTER — Encounter: Payer: Self-pay | Admitting: Physician Assistant

## 2018-01-10 ENCOUNTER — Ambulatory Visit (INDEPENDENT_AMBULATORY_CARE_PROVIDER_SITE_OTHER): Payer: Medicare HMO | Admitting: Rheumatology

## 2018-01-10 VITALS — BP 138/84 | HR 63 | Resp 15 | Ht 64.0 in | Wt 207.5 lb

## 2018-01-10 DIAGNOSIS — M19071 Primary osteoarthritis, right ankle and foot: Secondary | ICD-10-CM

## 2018-01-10 DIAGNOSIS — M3219 Other organ or system involvement in systemic lupus erythematosus: Secondary | ICD-10-CM | POA: Diagnosis not present

## 2018-01-10 DIAGNOSIS — M7732 Calcaneal spur, left foot: Secondary | ICD-10-CM

## 2018-01-10 DIAGNOSIS — E790 Hyperuricemia without signs of inflammatory arthritis and tophaceous disease: Secondary | ICD-10-CM | POA: Diagnosis not present

## 2018-01-10 DIAGNOSIS — R945 Abnormal results of liver function studies: Secondary | ICD-10-CM | POA: Diagnosis not present

## 2018-01-10 DIAGNOSIS — D472 Monoclonal gammopathy: Secondary | ICD-10-CM

## 2018-01-10 DIAGNOSIS — M19041 Primary osteoarthritis, right hand: Secondary | ICD-10-CM

## 2018-01-10 DIAGNOSIS — M170 Bilateral primary osteoarthritis of knee: Secondary | ICD-10-CM | POA: Diagnosis not present

## 2018-01-10 DIAGNOSIS — Z79899 Other long term (current) drug therapy: Secondary | ICD-10-CM

## 2018-01-10 DIAGNOSIS — M7731 Calcaneal spur, right foot: Secondary | ICD-10-CM

## 2018-01-10 DIAGNOSIS — I10 Essential (primary) hypertension: Secondary | ICD-10-CM | POA: Diagnosis not present

## 2018-01-10 DIAGNOSIS — E559 Vitamin D deficiency, unspecified: Secondary | ICD-10-CM | POA: Diagnosis not present

## 2018-01-10 DIAGNOSIS — M19042 Primary osteoarthritis, left hand: Secondary | ICD-10-CM | POA: Diagnosis not present

## 2018-01-10 DIAGNOSIS — M19072 Primary osteoarthritis, left ankle and foot: Secondary | ICD-10-CM

## 2018-01-10 DIAGNOSIS — R7989 Other specified abnormal findings of blood chemistry: Secondary | ICD-10-CM

## 2018-01-10 NOTE — Patient Instructions (Signed)
Azathioprine tablets What is this medicine? AZATHIOPRINE (ay za THYE oh preen) suppresses the immune system. It is used to prevent organ rejection after a transplant. It is also used to treat rheumatoid arthritis. This medicine may be used for other purposes; ask your health care provider or pharmacist if you have questions. COMMON BRAND NAME(S): Azasan, Imuran What should I tell my health care provider before I take this medicine? They need to know if you have any of these conditions: -infection -kidney disease -liver disease -an unusual or allergic reaction to azathioprine, other medicines, lactose, foods, dyes, or preservatives -pregnant or trying to get pregnant -breast feeding How should I use this medicine? Take this medicine by mouth with a full glass of water. Follow the directions on the prescription label. Take your medicine at regular intervals. Do not take your medicine more often than directed. Continue to take your medicine even if you feel better. Do not stop taking except on your doctor's advice. Talk to your pediatrician regarding the use of this medicine in children. Special care may be needed. Overdosage: If you think you have taken too much of this medicine contact a poison control center or emergency room at once. NOTE: This medicine is only for you. Do not share this medicine with others. What if I miss a dose? If you miss a dose, take it as soon as you can. If it is almost time for your next dose, take only that dose. Do not take double or extra doses. What may interact with this medicine? Do not take this medicine with any of the following medications: -febuxostat -mercaptopurine This medicine may also interact with the following medications: -allopurinol -aminosalicylates like sulfasalazine, mesalamine, balsalazide, and olsalazine -leflunomide -medicines called ACE inhibitors like benazepril, captopril, enalapril, fosinopril, quinapril, lisinopril, ramipril, and  trandolapril -mycophenolate -sulfamethoxazole; trimethoprim -vaccines -warfarin This list may not describe all possible interactions. Give your health care provider a list of all the medicines, herbs, non-prescription drugs, or dietary supplements you use. Also tell them if you smoke, drink alcohol, or use illegal drugs. Some items may interact with your medicine. What should I watch for while using this medicine? Visit your doctor or health care professional for regular checks on your progress. You will need frequent blood checks during the first few months you are receiving the medicine. If you get a cold or other infection while receiving this medicine, call your doctor or health care professional. Do not treat yourself. The medicine may increase your risk of getting an infection. Women should inform their doctor if they wish to become pregnant or think they might be pregnant. There is a potential for serious side effects to an unborn child. Talk to your health care professional or pharmacist for more information. Men may have a reduced sperm count while they are taking this medicine. Talk to your health care professional for more information. This medicine may increase your risk of getting certain kinds of cancer. Talk to your doctor about healthy lifestyle choices, important screenings, and your risk. What side effects may I notice from receiving this medicine? Side effects that you should report to your doctor or health care professional as soon as possible: -allergic reactions like skin rash, itching or hives, swelling of the face, lips, or tongue -changes in vision -confusion -fever, chills, or any other sign of infection -loss of balance or coordination -severe stomach pain -unusual bleeding, bruising -unusually weak or tired -vomiting -yellowing of the eyes or skin Side effects that usually do   not require medical attention (report to your doctor or health care professional if they  continue or are bothersome): -hair loss -nausea This list may not describe all possible side effects. Call your doctor for medical advice about side effects. You may report side effects to FDA at 1-800-FDA-1088. Where should I keep my medicine? Keep out of the reach of children. Store at room temperature between 15 and 25 degrees C (59 and 77 degrees F). Protect from light. Throw away any unused medicine after the expiration date. NOTE: This sheet is a summary. It may not cover all possible information. If you have questions about this medicine, talk to your doctor, pharmacist, or health care provider.  2018 Elsevier/Gold Standard (2013-12-31 12:00:31)  

## 2018-01-23 NOTE — Progress Notes (Signed)
Office Visit Note  Patient: Deanna Hart             Date of Birth: 09-Jan-1941           MRN: [id number]             PCP: [name], MD Referring: [name], MD Visit Date: 02/06/2018 Occupation: @GUAROCC @    Subjective:  Skin yeast infection    History of Present Illness: Deanna Hart is a 77 y.o. female with history of systemic lupus erythematosus and osteoarthritis.  Patient states that she continues to take prednisone 40 mg by mouth daily.  She states that she has been tolerating fairly well but feels a little bit of bloating.  She denies any joint pain or joint swelling at this time.  She denies any oral ulcerations or nasal ulcerations.  She denies any sicca symptoms or symptoms of Raynauds.  She states that her left knee continues to feel warm but has no discomfort. She states that she currently is taking fluconazole 150 mg by mouth 1 tablet for 7 days and applying clotriamzone cream 1% daily for a yeast infection on her pannus.  She reports the yeast infection is improving and the itchiness has improved.   Activities of Daily Living:  Patient reports morning stiffness for none.   Patient Denies nocturnal pain.  Difficulty dressing/grooming: Denies Difficulty climbing stairs: Reports Difficulty getting out of chair: Denies Difficulty using hands for taps, buttons, cutlery, and/or writing: Denies   Review of Systems  Constitutional: Negative for fatigue.  HENT: Negative for mouth sores, mouth dryness and nose dryness.   Eyes: Negative for pain, visual disturbance and dryness.  Respiratory: Negative for cough, hemoptysis, shortness of breath and difficulty breathing.   Cardiovascular: Positive for swelling in legs/feet. Negative for chest pain, palpitations and hypertension.  Gastrointestinal: Negative for blood in stool, constipation and diarrhea.  Endocrine: Negative for excessive thirst and increased urination.  Genitourinary: Negative for  difficulty urinating and painful urination.  Musculoskeletal: Negative for arthralgias, joint pain, joint swelling, myalgias, muscle weakness, morning stiffness, muscle tenderness and myalgias.  Skin: Positive for rash. Negative for color change, pallor, hair loss, nodules/bumps, skin tightness, ulcers and sensitivity to sunlight.  Allergic/Immunologic: Negative for susceptible to infections.  Neurological: Negative for dizziness, numbness, headaches and weakness.  Hematological: Negative for bruising/bleeding tendency and swollen glands.  Psychiatric/Behavioral: Negative for depressed mood and sleep disturbance. The patient is not nervous/anxious.     PMFS History:  Patient Active Problem List   Diagnosis Date Noted  . MGUS (monoclonal gammopathy of unknown significance) 06/01/2017  . Hyperuricemia 06/01/2017  . Elevated LFTs 03/03/2017  . Acute pain of right shoulder 11/28/2016  . High risk medications (not anticoagulants) long-term use 07/19/2016  . Rheumatoid factor positive 07/19/2016  . SLE (systemic lupus erythematosus) ([place]) 07/18/2016  . Primary osteoarthritis of both hands 07/18/2016  . Primary osteoarthritis of both feet 07/18/2016  . Calcaneal spur of both feet 07/18/2016  . Osteoarthritis of both knees 07/18/2016  . Hypertension 07/18/2016  . Obesity 07/18/2016  . Vitamin D deficiency 07/18/2016    Past Medical History:  Diagnosis Date  . Calcaneal spur of both feet 07/18/2016  . Hypertension 07/18/2016  . Obesity 07/18/2016  . Osteoarthritis of both feet 07/18/2016  . Osteoarthritis of both hands 07/18/2016  . 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   Marland Kitchen SLE (systemic lupus erythematosus) ([place]) 07/18/2016   Positive ANA, Positive Ro, Positive  Smith, Positive RNP, Positive RF, Negative CCP,  Elevated LFTs  . Vitamin D deficiency 07/18/2016    Family History  Problem Relation Age of Onset  . Cancer Brother   . Cancer Maternal  Uncle    Past Surgical History:  Procedure Laterality Date  . BREAST SURGERY    . EYE SURGERY Bilateral 07/2017   cataracts   Social History   Social History Narrative  . Not on file     Objective: Vital Signs: BP 139/86 (BP Location: Left Arm, Patient Position: Sitting, Cuff Size: Normal)   Pulse 86   Resp 16   Ht 5\' 4"  (1.626 m)   Wt 213 lb (96.6 kg)   BMI 36.56 kg/m    Physical Exam  Constitutional: She is oriented to person, place, and time. She appears well-developed and well-nourished.  HENT:  Head: Normocephalic and atraumatic.  Eyes: Conjunctivae and EOM are normal.  Neck: Normal range of motion.  Cardiovascular: Normal rate, regular rhythm, normal heart sounds and intact distal pulses.  Pulmonary/Chest: Effort normal and breath sounds normal.  Abdominal: Soft. Bowel sounds are normal.  Lymphadenopathy:    She has no cervical adenopathy.  Neurological: She is alert and oriented to person, place, and time.  Skin: Skin is warm and dry. Capillary refill takes less than 2 seconds.  Psychiatric: She has a normal mood and affect. Her behavior is normal.  Nursing note and vitals reviewed.    Musculoskeletal Exam: C-spine, thoracic spine, lumbar spine good range of motion with no midline spinal tenderness.  No SI joint tenderness.  Shoulder joints, elbow joints, wrist joints, MCPs, PIPs, DIPs good range of motion with no synovitis.  Hip joints, knee joints, ankle joints, MTPs, PIPs, DIPs good range of motion with no synovitis. Warmth of left knee. No tenderness of trochanteric bursa.    CDAI Exam: No CDAI exam completed.    Investigation: No additional findings. CBC Latest Ref Rng & Units 01/02/2018 12/13/2017 12/13/2017  WBC 3.9 - 10.3 K/uL 7.3 5.4 5.4  Hemoglobin 11.6 - 15.9 g/dL 13.4 13.6 13.6  Hematocrit 34.8 - 46.6 % 39.8 39.2 39.2  Platelets 145 - 400 K/uL 167 207 207   CMP Latest Ref Rng & Units 01/02/2018 12/13/2017 10/25/2017  Glucose 70 - 140 mg/dL 104  100(H) 116(H)  BUN 7 - 26 mg/dL 16 11 11   Creatinine 0.60 - 1.10 mg/dL 0.80 0.55(L) 0.65  Sodium 136 - 145 mmol/L 140 136 141  Potassium 3.5 - 5.1 mmol/L 3.0(LL) 3.6 4.5  Chloride 98 - 109 mmol/L 104 100 104  CO2 22 - 29 mmol/L 29 29 32  Calcium 8.4 - 10.4 mg/dL 9.1 9.4 9.5  Total Protein 6.4 - 8.3 g/dL 7.4 7.7 6.9  Total Bilirubin 0.2 - 1.2 mg/dL 0.7 0.8 0.6  Alkaline Phos 40 - 150 U/L 106 - -  AST 5 - 34 U/L 147(H) 143(H) 62(H)  ALT 0 - 55 U/L 131(H) 69(H) 66(H)    Imaging: No results found.  Speciality Comments: No specialty comments available.    Procedures:  No procedures performed Allergies: Plaquenil [hydroxychloroquine sulfate]   Assessment / Plan:     Visit Diagnoses: Other systemic lupus erythematosus with other organ involvement ([place]): +ANA +Ro +Smith +RNP + RF neg CCP / h/o skin biopsy + by Dr Ronnald Ramp . She continues to take prednisone 40 mg by mouth daily for elevated LFTs.  She is asymptomatic at this time.  She has no oral or nasal ulcerations on exam.  No cervical lymphadenopathy.  She currently has a yeast infection on her pannus and is taking fluconazole 150 mg 1 tablet by mouth daily for 7 days and applying clotrimazole 1% daily.  She has no facial rashes or other lesions at this time.  She has no sicca symptoms or symptoms of Raynaud's.  No digital ulcerations noted.  No synovitis on exam.  She continues to have warmth of her left knee but no effusion or discomfort.  She will continue taking prednisone 40 mg by mouth daily.  We checked her LFTs and TPMT today.  If her LFTs have returned to the desirable range we will start her on Imuran.  High risk medications (not anticoagulants) long-term use -CBC CMP and TPMT were ordered today.  Plan: COMPLETE METABOLIC PANEL WITH GFR, Thiopurine methyltransferase(tpmt)rbc, CBC with Differential/Platelet  Elevated LFTs -LFTs and TPMT are checked today.  Plan: COMPLETE METABOLIC PANEL WITH GFR  Primary osteoarthritis of both  hands: She has no discomfort at this time.  She has no synovitis on exam.   Primary osteoarthritis of both knees: She has right knee warmth but no effusion.  She has no discomfort in her knees at this time.  Primary osteoarthritis of both feet: She has mild PIP and DIP synovial thickening.  No discomfort at this time.   Other medical conditions are listed as follows:   MGUS (monoclonal gammopathy of unknown significance)  Hyperuricemia  Calcaneal spur of both feet  Vitamin D deficiency  Essential hypertension    Orders: Orders Placed This Encounter  Procedures  . COMPLETE METABOLIC PANEL WITH GFR  . Thiopurine methyltransferase(tpmt)rbc  . CBC with Differential/Platelet   No orders of the defined types were placed in this encounter.   Follow-Up Instructions: Return in about 3 months (around 05/09/2018) for Systemic lupus erythematosus, Osteoarthritis.   [name], PA-C  Note - This record has been created using Dragon software.  Chart creation errors have been sought, but may not always  have been located. Such creation errors do not reflect on  the standard of medical care.

## 2018-01-25 ENCOUNTER — Telehealth: Payer: Self-pay | Admitting: Rheumatology

## 2018-01-25 MED ORDER — PREDNISONE 10 MG PO TABS: ORAL_TABLET | ORAL | 0 refills | Status: DC

## 2018-01-25 NOTE — Telephone Encounter (Signed)
Last Visit: 01/10/18 Next Visit: 02/06/18  Okay to refill per Dr. Estanislado Pandy

## 2018-01-25 NOTE — Telephone Encounter (Signed)
Patient called requesting prescription refill of Prednisone.  Patient states she only has 3 pills left.  Patient's pharmacy is [facility] on [address].

## 2018-01-28 NOTE — Assessment & Plan Note (Signed)
77 year old female with active systemic lupus currently not on therapy followed for monoclonal gammopathy of unknown significance, confirmed by our evaluation. At this time, she does not meet any CRAB criteria to suspect an active multiple myeloma.   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  In the interim, patient was started on prednisone therapy which appears to have helped both the autoimmune hepatitis and monoclonal gammopathy.  At this time, lab work demonstrates no changes to suggest progression to multiple myeloma.  Plan: -Continue observation -Return to our clinic in 3 months with labs obtained 1 week prior for continued hematological monitoring.

## 2018-01-28 NOTE — Progress Notes (Signed)
[facility] Follow-up Visit:  Assessment: MGUS (monoclonal gammopathy of unknown significance) 77 year old female with active systemic lupus currently not on therapy followed for monoclonal gammopathy of unknown significance, confirmed by our evaluation. At this time, she does not meet any CRAB criteria to suspect an active multiple myeloma.   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  In the interim, patient was started on prednisone therapy which appears to have helped both the autoimmune hepatitis and monoclonal gammopathy.  At this time, lab work demonstrates no changes to suggest progression to multiple myeloma.  Plan: -Continue observation -Return to our clinic in 3 months with labs obtained 1 week prior for continued hematological monitoring.  Voice recognition software was used and creation of this note. Despite my best effort at editing the text, some misspelling/errors may have occurred.  Orders Placed This Encounter  Procedures  . CBC with Differential (Cancer Center Only)    Standing Status:   Future    Standing Expiration Date:   01/10/2019  . CMP ([place] only)    Standing Status:   Future    Standing Expiration Date:   01/10/2019  . Uric acid    Standing Status:   Future    Standing Expiration Date:   01/09/2019  . Beta 2 microglobulin, serum    Standing Status:   Future    Standing Expiration Date:   01/10/2019  . Kappa/lambda light chains    Standing Status:   Future    Standing Expiration Date:   01/10/2019  . Lactate dehydrogenase    Standing Status:   Future    Standing Expiration Date:   01/10/2019  . SPEP & IFE with QIG    Standing Status:   Future    Standing Expiration Date:   01/10/2019  . UPEP/UIFE/Light Chains/TP, 24-Hr Ur    Standing Status:   Future    Standing  Expiration Date:   01/10/2019    Cancer Staging No matching staging information was found for the patient.  All questions were answered.  . The patient knows to call the clinic with any problems, questions or concerns.  This note was electronically signed.    History of Presenting Illness Deanna Hart is a 77 y.o. female followed in the [facility] for monoclonal gammopathy. Patient has a diagnosis of systemic lupus erythematosus and positive rheumatoid factor and was undergoing completion of evaluation for her rheumatologist when laboratory abnormality was discovered. Before meals oncological/hematological history below for details. It appears that patient has been tried on Plaquenil followed by methotrexate which she has been unable to tolerate. The current rheumatological plan is to be treated with Imuran or Benlysta.  At the present time, patient denies any active new complaints. She does have extensive musculoskeletal complaints, but denies any respiratory, gastrointestinal, or neurological complaints. Patient returns to the clinic to review the findings. In the interim, patient was restarted on prednisone 40 mg PO Daily for arthritis and is being considered for Imuran therapy.    Oncological/hematological History: --Labs, 04/21/17: tProt 7.5, Alb 3.7, Ca 9.3, Cr 0.7, AP   71, AST   86, ALT   60, tBili 0.6;         SPEP -- M-Spike 0.9g/dL, SIFE -- monoclonal IgG lambda;          WBC 3.6, Nl diff, Hgb 13.2, Plt 202; HCV -- negative --  Labs, 05/30/17: tProt 8.5, Alb 3.6, Ca 9.4, Cr 0.8, AP   82, AST 171, ALT 106, tBili 0.8;         SPEP -- M-Spike 0.6g/dL, SIFE -- monoclonal IgG lambda; IgG 2292, IgA 316, IgM 28; kappa 34.9, lambda 33.2, KLR 1.05; UPEP -- M-Spike '18mg'$ /24hr; Uric acid 8.1;  --Labs, 10/03/17: tProt 8.8, Alb 3.7, Ca 9.6, Cr 0.7, AP 106, AST 342, ALT 202, LDH 338, beta-2 microglobulin 2.1; SPEP -- M-Spike 0.7g/dL, SIFE -- monoclonal IgG lambda; IgG 2709, IgA 366, IgM 30;  kappa 42.1, lambda 30.3, KLR 1.39; --Labs, 01/02/18: tProt 7.4, Alb 3.3, Ca 9.1, Cr 0.8, AP 106, AST 147, ALT 131, LDH 225, beta-2 microglobulin   ...; SPEP -- M-Spike 0.4g/dL, SIFE -- monoclonal IgG lambda; IgG 1740, IgA 284, IgM 26; kappa 24.1, lambda 20.0, KLR 1.21;  WBC 7.3, Hgb 13.4, Plt 167;  Oncological/hematological History:  No history exists.    Medical History: Past Medical History:  Diagnosis Date  . Calcaneal spur of both feet 07/18/2016  . Hypertension 07/18/2016  . Obesity 07/18/2016  . Osteoarthritis of both feet 07/18/2016  . Osteoarthritis of both hands 07/18/2016  . 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   Marland Kitchen SLE (systemic lupus erythematosus) ([place]) 07/18/2016   Positive ANA, Positive Ro, Positive Smith, Positive RNP, Positive RF, Negative CCP,  Elevated LFTs  . Vitamin D deficiency 07/18/2016    Surgical History: Past Surgical History:  Procedure Laterality Date  . BREAST SURGERY    . EYE SURGERY Bilateral 07/2017   cataracts    Family History: Family History  Problem Relation Age of Onset  . Cancer Brother   . Cancer Maternal Uncle     Social History: Social History   Socioeconomic History  . Marital status: Widowed    Spouse name: Not on file  . Number of children: Not on file  . Years of education: Not on file  . Highest education level: Not on file  Occupational History  . Not on file  Social Needs  . Financial resource strain: Not on file  . Food insecurity:    Worry: Not on file    Inability: Not on file  . Transportation needs:    Medical: Not on file    Non-medical: Not on file  Tobacco Use  . Smoking status: Former Smoker    Years: 3.00    Types: Cigarettes  . Smokeless tobacco: Former Systems developer    Types: Chew  Substance and Sexual Activity  . Alcohol use: No  . Drug use: Never  . Sexual activity: Not on file  Lifestyle  . Physical activity:    Days per week: Not on file    Minutes per  session: Not on file  . Stress: Not on file  Relationships  . Social connections:    Talks on phone: Not on file    Gets together: Not on file    Attends religious service: Not on file    Active member of club or organization: Not on file    Attends meetings of clubs or organizations: Not on file    Relationship status: Not on file  . Intimate partner violence:    Fear of current or ex partner: Not on file    Emotionally abused: Not on file    Physically abused: Not on file    Forced sexual activity: Not on file  Other Topics Concern  . Not on file  Social History  Narrative  . Not on file    Allergies: Allergies  Allergen Reactions  . Plaquenil [Hydroxychloroquine Sulfate] Nausea Only    Medications:  Current Outpatient Medications  Medication Sig Dispense Refill  . allopurinol (ZYLOPRIM) 300 MG tablet TAKE 1 TABLET(300 MG) BY MOUTH DAILY 90 tablet 3  . amLODipine (NORVASC) 5 MG tablet Take 5 mg by mouth daily.     . traMADol (ULTRAM) 50 MG tablet daily.     Marland Kitchen triamcinolone cream (KENALOG) 0.1 % APP AA BID  2  . triamterene-hydrochlorothiazide (MAXZIDE-25) 37.5-25 MG tablet Take 1 tablet by mouth daily.     . predniSONE (DELTASONE) 10 MG tablet Take 4 tablets by mouth at breakfast daily. 120 tablet 0   No current facility-administered medications for this visit.     Review of Systems: Review of Systems  All other systems reviewed and are negative.    PHYSICAL EXAMINATION Blood pressure (!) 155/94, pulse 81, temperature 98.8 F (37.1 C), temperature source Oral, resp. rate 18, weight 206 lb (93.4 kg), head circumference 64" (162.6 cm), SpO2 100 %.  ECOG PERFORMANCE STATUS: 1 - Symptomatic but completely ambulatory  Physical Exam  Constitutional: She is oriented to person, place, and time and well-developed, well-nourished, and in no distress. No distress.  HENT:  Head: Normocephalic and atraumatic.  Mouth/Throat: No oropharyngeal exudate.  Eyes: Pupils are  equal, round, and reactive to light. Conjunctivae and EOM are normal. No scleral icterus.  Neck: Normal range of motion. Neck supple. No thyromegaly present.  Cardiovascular: Normal rate, regular rhythm and normal heart sounds. Exam reveals no gallop.  No murmur heard. Pulmonary/Chest: Effort normal and breath sounds normal. She has no wheezes.  Abdominal: Soft. Bowel sounds are normal. She exhibits no distension and no mass. There is no tenderness. There is no rebound and no guarding.  Musculoskeletal: She exhibits tenderness. She exhibits no edema or deformity.  Lymphadenopathy:    She has no cervical adenopathy.  Neurological: She is alert and oriented to person, place, and time. She has normal reflexes.  Skin: Skin is warm and dry. She is not diaphoretic. No erythema.     LABORATORY DATA: I have personally reviewed the data as listed: No visits with results within 1 Week(s) from this visit.  Latest known visit with results is:  Lab on 01/02/2018  Component Date Value Ref Range Status  . WBC Count 01/02/2018 7.3  3.9 - 10.3 K/uL Final  . RBC 01/02/2018 4.01  3.70 - 5.45 MIL/uL Final  . Hemoglobin 01/02/2018 13.4  11.6 - 15.9 g/dL Final  . HCT 01/02/2018 39.8  34.8 - 46.6 % Final  . MCV 01/02/2018 99.3  79.5 - 101.0 fL Final  . MCH 01/02/2018 33.4  25.1 - 34.0 pg Final  . MCHC 01/02/2018 33.7  31.5 - 36.0 g/dL Final  . RDW 01/02/2018 14.4  11.2 - 14.5 % Final  . Platelet Count 01/02/2018 167  145 - 400 K/uL Final  . Neutrophils Relative % 01/02/2018 60  % Final  . Neutro Abs 01/02/2018 4.3  1.5 - 6.5 K/uL Final  . Lymphocytes Relative 01/02/2018 28  % Final  . Lymphs Abs 01/02/2018 2.0  0.9 - 3.3 K/uL Final  . Monocytes Relative 01/02/2018 12  % Final  . Monocytes Absolute 01/02/2018 0.9  0.1 - 0.9 K/uL Final  . Eosinophils Relative 01/02/2018 0  % Final  . Eosinophils Absolute 01/02/2018 0.0  0.0 - 0.5 K/uL Final  . Basophils Relative 01/02/2018 0  %  Final  . Basophils  Absolute 01/02/2018 0.0  0.0 - 0.1 K/uL Final   Performed at [facility], [place] [address] [place], [place] [address]  . Sodium 01/02/2018 140  136 - 145 mmol/L Final  . Potassium 01/02/2018 3.0* 3.5 - 5.1 mmol/L Final   CRITICAL RESULT CALLED TO, READ BACK BY AND VERIFIED WITH: Wendall Mola, RN at [phone]    . Chloride 01/02/2018 104  98 - 109 mmol/L Final  . CO2 01/02/2018 29  22 - 29 mmol/L Final  . Glucose, Bld 01/02/2018 104  70 - 140 mg/dL Final  . BUN 01/02/2018 16  7 - 26 mg/dL Final  . Creatinine 01/02/2018 0.80  0.60 - 1.10 mg/dL Final  . Calcium 01/02/2018 9.1  8.4 - 10.4 mg/dL Final  . Total Protein 01/02/2018 7.4  6.4 - 8.3 g/dL Final  . Albumin 01/02/2018 3.3* 3.5 - 5.0 g/dL Final  . AST 01/02/2018 147* 5 - 34 U/L Final  . ALT 01/02/2018 131* 0 - 55 U/L Final  . Alkaline Phosphatase 01/02/2018 106  40 - 150 U/L Final  . Total Bilirubin 01/02/2018 0.7  0.2 - 1.2 mg/dL Final  . GFR, Est Non Af Am 01/02/2018 >60  >60 mL/min Final  . GFR, Est AFR Am 01/02/2018 >60  >60 mL/min Final   Comment: (NOTE) The eGFR has been calculated using the CKD EPI equation. This calculation has not been validated in all clinical situations. eGFR's persistently <60 mL/min signify possible Chronic Kidney Disease.   Georgiann Hahn gap 01/02/2018 7  3 - 11 Final   Performed at [facility], [place] [address], [place], [place] [address]  . LDH 01/02/2018 225  125 - 245 U/L Final   Performed at [facility], [place] [address] [place], [place] [address]  . IgG (Immunoglobin G), Serum 01/02/2018 1,740* 700 - 1,600 mg/dL Final  . IgA 01/02/2018 284  64 - 422 mg/dL Final  . IgM (Immunoglobulin M), Srm 01/02/2018 26  26 - 217 mg/dL Final  . Total Protein ELP 01/02/2018 6.8  6.0 - 8.5 g/dL Corrected  . Albumin SerPl Elph-Mcnc 01/02/2018 3.1  2.9 - 4.4 g/dL Corrected  . Alpha 1 01/02/2018 0.2  0.0 - 0.4 g/dL Corrected  . Alpha2 Glob SerPl  Elph-Mcnc 01/02/2018 0.8  0.4 - 1.0 g/dL Corrected  . B-Globulin SerPl Elph-Mcnc 01/02/2018 1.2  0.7 - 1.3 g/dL Corrected  . Gamma Glob SerPl Elph-Mcnc 01/02/2018 1.5  0.4 - 1.8 g/dL Corrected  . M Protein SerPl Elph-Mcnc 01/02/2018 0.4* Not Observed g/dL Corrected  . Globulin, Total 01/02/2018 3.7  2.2 - 3.9 g/dL Corrected  . Albumin/Glob SerPl 01/02/2018 0.9  0.7 - 1.7 Corrected  . IFE 1 01/02/2018 Comment   Corrected   Comment: (NOTE) Immunofixation shows IgG monoclonal protein with lambda light chain specificity.   . Please Note 01/02/2018 Comment   Corrected   Comment: (NOTE) Protein electrophoresis scan will follow via computer, mail, or courier delivery. Performed At: [facility] [place], [place] [id number] Rush Farmer MD TO:[id number] Performed at [facility], [place] [address] [place], [place] [address]   . Kappa free light chain 01/02/2018 24.1* 3.3 - 19.4 mg/L Final  . Lamda free light chains 01/02/2018 20.0  5.7 - 26.3 mg/L Final  . Kappa, lamda light chain ratio 01/02/2018 1.21  0.26 - 1.65 Final   Comment: (NOTE) Performed At: [place] [address]  [place], [place] [id number] Rush Farmer MD [id number] Performed at [facility], [place] [address] [place], [place] [address]   . Total Protein, Urine 01/04/2018 13.9  Not Estab. mg/dL Final  . Total Protein, Urine-Ur/day 01/04/2018 417* 30 - 150 mg/24 hr Final  . Albumin, U 01/04/2018 100.0  % Final  . ALPHA 1 URINE 01/04/2018 0.0  % Final  . Alpha 2, Urine 01/04/2018 0.0  % Final  . % BETA, Urine 01/04/2018 0.0  % Final  . GAMMA GLOBULIN URINE 01/04/2018 0.0  % Final  . Free Kappa Lt Chains,Ur 01/04/2018 48.10* 1.35 - 24.19 mg/L Final  . Free Lambda Lt Chains,Ur 01/04/2018 4.30  0.24 - 6.66 mg/L Corrected   **Results verified by repeat testing**  . Free Kappa/Lambda Ratio 01/04/2018 11.19* 2.04 - 10.37 Corrected   Comment:  (NOTE) Performed At: [facility] [address] [place], [place] [id number] Rush Farmer MD [id number]   . Immunofixation Result, Urine 01/04/2018 Comment   Corrected   An apparent normal immunofixation pattern.  . Total Volume 01/04/2018 please measure   Final  . M-SPIKE %, Urine 01/04/2018 Not Observed  Not Observed % Corrected  . Note: 01/04/2018 Comment   Corrected   Comment: (NOTE) Protein electrophoresis scan will follow via computer, mail, or courier delivery. Performed at [facility], [place] [address] [place], [place] [address]        Ardath Sax, MD

## 2018-01-29 ENCOUNTER — Telehealth: Payer: Self-pay | Admitting: Rheumatology

## 2018-01-29 NOTE — Telephone Encounter (Signed)
Patient states her rash has come up again under her flank. It itches real bad. Patient uses [facility] on [address] of [address], and [address]. Per patient Cortisone does not help. She requests you call her something in strong. Please call patient to advise.

## 2018-01-30 NOTE — Telephone Encounter (Signed)
Please have patient follow up with PCP for evaluation of rash.

## 2018-01-30 NOTE — Telephone Encounter (Signed)
Left message to advise patient to follow up with PCP.

## 2018-02-06 ENCOUNTER — Ambulatory Visit (INDEPENDENT_AMBULATORY_CARE_PROVIDER_SITE_OTHER): Payer: Medicare HMO | Admitting: Physician Assistant

## 2018-02-06 ENCOUNTER — Encounter: Payer: Self-pay | Admitting: Physician Assistant

## 2018-02-06 VITALS — BP 139/86 | HR 86 | Resp 16 | Ht 64.0 in | Wt 213.0 lb

## 2018-02-06 DIAGNOSIS — M19071 Primary osteoarthritis, right ankle and foot: Secondary | ICD-10-CM | POA: Diagnosis not present

## 2018-02-06 DIAGNOSIS — E559 Vitamin D deficiency, unspecified: Secondary | ICD-10-CM | POA: Diagnosis not present

## 2018-02-06 DIAGNOSIS — R945 Abnormal results of liver function studies: Secondary | ICD-10-CM

## 2018-02-06 DIAGNOSIS — M170 Bilateral primary osteoarthritis of knee: Secondary | ICD-10-CM

## 2018-02-06 DIAGNOSIS — M3219 Other organ or system involvement in systemic lupus erythematosus: Secondary | ICD-10-CM | POA: Diagnosis not present

## 2018-02-06 DIAGNOSIS — M19041 Primary osteoarthritis, right hand: Secondary | ICD-10-CM | POA: Diagnosis not present

## 2018-02-06 DIAGNOSIS — Z79899 Other long term (current) drug therapy: Secondary | ICD-10-CM

## 2018-02-06 DIAGNOSIS — M7731 Calcaneal spur, right foot: Secondary | ICD-10-CM

## 2018-02-06 DIAGNOSIS — I10 Essential (primary) hypertension: Secondary | ICD-10-CM

## 2018-02-06 DIAGNOSIS — E790 Hyperuricemia without signs of inflammatory arthritis and tophaceous disease: Secondary | ICD-10-CM

## 2018-02-06 DIAGNOSIS — M19042 Primary osteoarthritis, left hand: Secondary | ICD-10-CM | POA: Diagnosis not present

## 2018-02-06 DIAGNOSIS — M7732 Calcaneal spur, left foot: Secondary | ICD-10-CM

## 2018-02-06 DIAGNOSIS — M19072 Primary osteoarthritis, left ankle and foot: Secondary | ICD-10-CM

## 2018-02-06 DIAGNOSIS — D472 Monoclonal gammopathy: Secondary | ICD-10-CM | POA: Diagnosis not present

## 2018-02-06 DIAGNOSIS — R7989 Other specified abnormal findings of blood chemistry: Secondary | ICD-10-CM

## 2018-02-10 LAB — COMPLETE METABOLIC PANEL WITH GFR
AG RATIO: 1.3 (calc) (ref 1.0–2.5)
ALT: 46 U/L — AB (ref 6–29)
AST: 37 U/L — AB (ref 10–35)
Albumin: 4.1 g/dL (ref 3.6–5.1)
Alkaline phosphatase (APISO): 95 U/L (ref 33–130)
BILIRUBIN TOTAL: 0.5 mg/dL (ref 0.2–1.2)
BUN: 17 mg/dL (ref 7–25)
CALCIUM: 9.8 mg/dL (ref 8.6–10.4)
CHLORIDE: 98 mmol/L (ref 98–110)
CO2: 34 mmol/L — ABNORMAL HIGH (ref 20–32)
Creat: 0.78 mg/dL (ref 0.60–0.93)
GFR, EST NON AFRICAN AMERICAN: 73 mL/min/{1.73_m2} (ref >=60)
GFR, Est African American: 85 mL/min/{1.73_m2} (ref >=60)
GLUCOSE: 106 mg/dL — AB (ref 65–99)
Globulin: 3.1 g/dL (calc) (ref 1.9–3.7)
POTASSIUM: 4.4 mmol/L (ref 3.5–5.3)
Sodium: 136 mmol/L (ref 135–146)
Total Protein: 7.2 g/dL (ref 6.1–8.1)

## 2018-02-10 LAB — CBC WITH DIFFERENTIAL/PLATELET
BASOS PCT: 0.4 %
Basophils Absolute: 48 cells/uL (ref 0–200)
Eosinophils Absolute: 36 cells/uL (ref 15–500)
Eosinophils Relative: 0.3 %
HEMATOCRIT: 40.1 % (ref 35.0–45.0)
Hemoglobin: 14.2 g/dL (ref 11.7–15.5)
Lymphs Abs: 1583 cells/uL (ref 850–3900)
MCH: 34 pg — ABNORMAL HIGH (ref 27.0–33.0)
MCHC: 35.4 g/dL (ref 32.0–36.0)
MCV: 95.9 fL (ref 80.0–100.0)
MONOS PCT: 8.9 %
MPV: 11.3 fL (ref 7.5–12.5)
NEUTROS PCT: 77.1 %
Neutro Abs: 9175 cells/uL — ABNORMAL HIGH (ref 1500–7800)
PLATELETS: 158 10*3/uL (ref 140–400)
RBC: 4.18 10*6/uL (ref 3.80–5.10)
RDW: 12.9 % (ref 11.0–15.0)
TOTAL LYMPHOCYTE: 13.3 %
WBC: 11.9 10*3/uL — AB (ref 3.8–10.8)
WBCMIX: 1059 {cells}/uL — AB (ref 200–950)

## 2018-02-10 LAB — THIOPURINE METHYLTRANSFERASE (TPMT), RBC: THIOPURINE METHYLTRANSFERASE, RBC: 18 nmol/h/mL

## 2018-02-13 NOTE — Progress Notes (Signed)
Glucose 106.  AST 37 and ALT 46.  TPMT WNL. WBC elevated but patient is on prednisone.  We will continue to monitor.   We previously obtained consent for Imuran. Please remind patient of potential side effects. We will start her on Imuran 50 mg by mouth daily.  We will recheck labs in 2 week and then 2 months.

## 2018-02-19 ENCOUNTER — Telehealth: Payer: Self-pay | Admitting: *Deleted

## 2018-02-19 MED ORDER — AZATHIOPRINE 50 MG PO TABS: 50.0000 mg | ORAL_TABLET | Freq: Every day | ORAL | 2 refills | Status: DC

## 2018-02-19 NOTE — Telephone Encounter (Signed)
-----   Message from [name], PA-C sent at 02/13/2018 11:52 AM EDT ----- Glucose 106.  AST 37 and ALT 46.  TPMT WNL. WBC elevated but patient is on prednisone.  We will continue to monitor.   We previously obtained consent for Imuran. Please remind patient of potential side effects. We will start her on Imuran 50 mg by mouth daily.  We will recheck labs in 2 week and then 2 months.

## 2018-02-22 ENCOUNTER — Telehealth: Payer: Self-pay | Admitting: Hematology and Oncology

## 2018-02-22 ENCOUNTER — Telehealth: Payer: Self-pay | Admitting: Rheumatology

## 2018-02-22 MED ORDER — PREDNISONE 10 MG PO TABS: ORAL_TABLET | ORAL | 0 refills | Status: DC

## 2018-02-22 NOTE — Telephone Encounter (Signed)
Last Visit: 02/06/18 Next visit: 05/08/18  Okay to refill per Dr. Estanislado Pandy

## 2018-02-22 NOTE — Telephone Encounter (Signed)
Patient left a voicemail requesting prescription refill of Prednisone to be sent to [facility] on [address].  Patient states she only has 3 pills remaining.

## 2018-02-22 NOTE — Telephone Encounter (Signed)
Called pt re appt being move to [facility] - left vm for pt and sending confirmation letter in the mail.

## 2018-02-28 ENCOUNTER — Telehealth: Payer: Self-pay | Admitting: Rheumatology

## 2018-02-28 NOTE — Telephone Encounter (Signed)
Patient advised of to taker her medications and to follow up with GI. Patient verbalized understanding.

## 2018-02-28 NOTE — Telephone Encounter (Signed)
Patient having cramps in fingers, and toes for 2 weeks now. Patient has not taken medication today due issues, and has had no cramps today. Please call to advise.

## 2018-02-28 NOTE — Telephone Encounter (Signed)
She should resume her medications and schedule an appointment with her GI .

## 2018-02-28 NOTE — Telephone Encounter (Signed)
Patient states she has been having cramps in her toes and her finger. Patient states she did not take her Prednisone, Allopurinol and Imuran this morning. Patient states she has not had any cramping this morning. Patient states she is on Prednisone 40 mg currently. Patient would like to know what she should do about the cramping as it has gone on for 2 weeks and stopped when she stopped the medications.

## 2018-03-02 ENCOUNTER — Other Ambulatory Visit: Payer: Self-pay

## 2018-03-02 DIAGNOSIS — Z79899 Other long term (current) drug therapy: Secondary | ICD-10-CM

## 2018-03-02 LAB — COMPLETE METABOLIC PANEL WITH GFR
AG Ratio: 1.4 (calc) (ref 1.0–2.5)
ALT: 38 U/L — AB (ref 6–29)
AST: 42 U/L — AB (ref 10–35)
Albumin: 3.8 g/dL (ref 3.6–5.1)
Alkaline phosphatase (APISO): 114 U/L (ref 33–130)
BUN: 18 mg/dL (ref 7–25)
CALCIUM: 9.2 mg/dL (ref 8.6–10.4)
CO2: 32 mmol/L (ref 20–32)
CREATININE: 0.84 mg/dL (ref 0.60–0.93)
Chloride: 103 mmol/L (ref 98–110)
GFR, EST NON AFRICAN AMERICAN: 67 mL/min/{1.73_m2} (ref >=60)
GFR, Est African American: 77 mL/min/{1.73_m2} (ref >=60)
Globulin: 2.7 g/dL (calc) (ref 1.9–3.7)
Glucose, Bld: 139 mg/dL — ABNORMAL HIGH (ref 65–99)
Potassium: 3.9 mmol/L (ref 3.5–5.3)
Sodium: 140 mmol/L (ref 135–146)
Total Bilirubin: 0.7 mg/dL (ref 0.2–1.2)
Total Protein: 6.5 g/dL (ref 6.1–8.1)

## 2018-03-02 LAB — CBC WITH DIFFERENTIAL/PLATELET
Basophils Absolute: 34 cells/uL (ref 0–200)
Basophils Relative: 0.3 %
EOS ABS: 23 {cells}/uL (ref 15–500)
Eosinophils Relative: 0.2 %
HCT: 38.2 % (ref 35.0–45.0)
Hemoglobin: 13.4 g/dL (ref 11.7–15.5)
Lymphs Abs: 2246 cells/uL (ref 850–3900)
MCH: 33.5 pg — ABNORMAL HIGH (ref 27.0–33.0)
MCHC: 35.1 g/dL (ref 32.0–36.0)
MCV: 95.5 fL (ref 80.0–100.0)
MONOS PCT: 8.2 %
MPV: 11.2 fL (ref 7.5–12.5)
NEUTROS ABS: 8162 {cells}/uL — AB (ref 1500–7800)
Neutrophils Relative %: 71.6 %
PLATELETS: 153 10*3/uL (ref 140–400)
RBC: 4 10*6/uL (ref 3.80–5.10)
RDW: 12.7 % (ref 11.0–15.0)
Total Lymphocyte: 19.7 %
WBC: 11.4 10*3/uL — ABNORMAL HIGH (ref 3.8–10.8)
WBCMIX: 935 {cells}/uL (ref 200–950)

## 2018-03-05 NOTE — Progress Notes (Signed)
Labs are stable.

## 2018-03-12 ENCOUNTER — Telehealth: Payer: Self-pay | Admitting: Rheumatology

## 2018-03-12 NOTE — Telephone Encounter (Signed)
Patient called stating she saw Dr. Benson Norway today and he told her to stop taking Prednisone because her Glucose level is high and that 40 mg was too high of a dose for her.  Patient also states she was told that she had to limit her servings of sweets, breads, and potatoes.  Patient is requesting a return call to let her know what she should do about the Prednisone.

## 2018-03-14 NOTE — Telephone Encounter (Signed)
I spoke with Dr. Benson Norway today and had detailed discussion.  He wants patient to stay on azathioprine 50 mg p.o. daily.  He wants her to taper prednisone by 10 mg every week until she reaches 20 mg a day.  We will check labs every 2 weeks x 3.  If her labs are stable we will decrease prednisone by 5 mg every week until she reaches 5 mg p.o. daily.  We will rebound 5 mg p.o. daily if her labs are stable for that taper can be decided.

## 2018-03-15 NOTE — Telephone Encounter (Signed)
Patient has been advised of recommendation. Patient will come by the office to pick up a calender to help her keep up with when to decrease dose of prednisone and when to come for labs.

## 2018-03-15 NOTE — Telephone Encounter (Signed)
Attempted to contact the patient and left message for patient to call the office.  

## 2018-03-29 ENCOUNTER — Other Ambulatory Visit: Payer: Self-pay | Admitting: *Deleted

## 2018-03-29 DIAGNOSIS — Z79899 Other long term (current) drug therapy: Secondary | ICD-10-CM

## 2018-03-30 LAB — COMPLETE METABOLIC PANEL WITH GFR
AG Ratio: 1.3 (calc) (ref 1.0–2.5)
ALT: 27 U/L (ref 6–29)
AST: 31 U/L (ref 10–35)
Albumin: 3.8 g/dL (ref 3.6–5.1)
Alkaline phosphatase (APISO): 94 U/L (ref 33–130)
BUN: 13 mg/dL (ref 7–25)
CO2: 30 mmol/L (ref 20–32)
Calcium: 9 mg/dL (ref 8.6–10.4)
Chloride: 101 mmol/L (ref 98–110)
Creat: 0.68 mg/dL (ref 0.60–0.93)
GFR, Est African American: 97 mL/min/{1.73_m2} (ref >=60)
GFR, Est Non African American: 84 mL/min/{1.73_m2} (ref >=60)
GLOBULIN: 2.9 g/dL (ref 1.9–3.7)
Glucose, Bld: 97 mg/dL (ref 65–99)
Potassium: 3.9 mmol/L (ref 3.5–5.3)
SODIUM: 138 mmol/L (ref 135–146)
Total Bilirubin: 0.7 mg/dL (ref 0.2–1.2)
Total Protein: 6.7 g/dL (ref 6.1–8.1)

## 2018-03-30 LAB — CBC WITH DIFFERENTIAL/PLATELET
BASOS ABS: 38 {cells}/uL (ref 0–200)
Basophils Relative: 0.5 %
EOS ABS: 38 {cells}/uL (ref 15–500)
Eosinophils Relative: 0.5 %
HEMATOCRIT: 37.8 % (ref 35.0–45.0)
Hemoglobin: 13.3 g/dL (ref 11.7–15.5)
Lymphs Abs: 1403 cells/uL (ref 850–3900)
MCH: 33.8 pg — AB (ref 27.0–33.0)
MCHC: 35.2 g/dL (ref 32.0–36.0)
MCV: 95.9 fL (ref 80.0–100.0)
MPV: 11.1 fL (ref 7.5–12.5)
Monocytes Relative: 7.1 %
Neutro Abs: 5490 cells/uL (ref 1500–7800)
Neutrophils Relative %: 73.2 %
Platelets: 150 10*3/uL (ref 140–400)
RBC: 3.94 10*6/uL (ref 3.80–5.10)
RDW: 13.1 % (ref 11.0–15.0)
TOTAL LYMPHOCYTE: 18.7 %
WBC: 7.5 10*3/uL (ref 3.8–10.8)
WBCMIX: 533 {cells}/uL (ref 200–950)

## 2018-04-03 ENCOUNTER — Telehealth: Payer: Self-pay | Admitting: Rheumatology

## 2018-04-03 ENCOUNTER — Inpatient Hospital Stay: Payer: Medicare HMO | Attending: Hematology and Oncology

## 2018-04-03 DIAGNOSIS — D472 Monoclonal gammopathy: Secondary | ICD-10-CM | POA: Insufficient documentation

## 2018-04-03 NOTE — Telephone Encounter (Signed)
Patient called stating she was returning your call regarding her labwork.

## 2018-04-04 NOTE — Telephone Encounter (Signed)
Patient advised lab results are normal 

## 2018-04-10 ENCOUNTER — Ambulatory Visit: Payer: Medicare HMO | Admitting: Hematology and Oncology

## 2018-04-12 ENCOUNTER — Other Ambulatory Visit: Payer: Self-pay

## 2018-04-12 DIAGNOSIS — Z79899 Other long term (current) drug therapy: Secondary | ICD-10-CM

## 2018-04-12 LAB — COMPLETE METABOLIC PANEL WITH GFR
AG Ratio: 1.5 (calc) (ref 1.0–2.5)
ALT: 33 U/L — ABNORMAL HIGH (ref 6–29)
AST: 45 U/L — ABNORMAL HIGH (ref 10–35)
Albumin: 4.1 g/dL (ref 3.6–5.1)
Alkaline phosphatase (APISO): 89 U/L (ref 33–130)
BUN: 9 mg/dL (ref 7–25)
CALCIUM: 9.4 mg/dL (ref 8.6–10.4)
CO2: 30 mmol/L (ref 20–32)
Chloride: 100 mmol/L (ref 98–110)
Creat: 0.69 mg/dL (ref 0.60–0.93)
GFR, Est African American: 97 mL/min/{1.73_m2} (ref >=60)
GFR, Est Non African American: 83 mL/min/{1.73_m2} (ref >=60)
Globulin: 2.8 g/dL (calc) (ref 1.9–3.7)
Glucose, Bld: 86 mg/dL (ref 65–99)
Potassium: 3.6 mmol/L (ref 3.5–5.3)
Sodium: 136 mmol/L (ref 135–146)
Total Bilirubin: 0.8 mg/dL (ref 0.2–1.2)
Total Protein: 6.9 g/dL (ref 6.1–8.1)

## 2018-04-12 LAB — CBC WITH DIFFERENTIAL/PLATELET
BASOS ABS: 50 {cells}/uL (ref 0–200)
Basophils Relative: 0.9 %
Eosinophils Absolute: 39 cells/uL (ref 15–500)
Eosinophils Relative: 0.7 %
HCT: 40.2 % (ref 35.0–45.0)
HEMOGLOBIN: 13.9 g/dL (ref 11.7–15.5)
Lymphs Abs: 1342 cells/uL (ref 850–3900)
MCH: 33.1 pg — AB (ref 27.0–33.0)
MCHC: 34.6 g/dL (ref 32.0–36.0)
MCV: 95.7 fL (ref 80.0–100.0)
MPV: 10.8 fL (ref 7.5–12.5)
Monocytes Relative: 9.5 %
Neutro Abs: 3548 cells/uL (ref 1500–7800)
Neutrophils Relative %: 64.5 %
Platelets: 178 10*3/uL (ref 140–400)
RBC: 4.2 10*6/uL (ref 3.80–5.10)
RDW: 13.5 % (ref 11.0–15.0)
Total Lymphocyte: 24.4 %
WBC mixed population: 523 cells/uL (ref 200–950)
WBC: 5.5 10*3/uL (ref 3.8–10.8)

## 2018-04-13 NOTE — Progress Notes (Signed)
LFTs elevated . Avoid NSAIDS and alcohol. Will continue to monitor LFTs.

## 2018-04-16 ENCOUNTER — Inpatient Hospital Stay: Payer: Medicare HMO

## 2018-04-16 ENCOUNTER — Telehealth: Payer: Self-pay | Admitting: Rheumatology

## 2018-04-16 ENCOUNTER — Telehealth: Payer: Self-pay | Admitting: Adult Health

## 2018-04-16 ENCOUNTER — Inpatient Hospital Stay (HOSPITAL_BASED_OUTPATIENT_CLINIC_OR_DEPARTMENT_OTHER): Payer: Medicare HMO | Admitting: Adult Health

## 2018-04-16 ENCOUNTER — Encounter: Payer: Self-pay | Admitting: Adult Health

## 2018-04-16 VITALS — BP 141/89 | HR 101 | Temp 98.8°F | Resp 18 | Ht 64.0 in | Wt 215.2 lb

## 2018-04-16 DIAGNOSIS — D472 Monoclonal gammopathy: Secondary | ICD-10-CM

## 2018-04-16 LAB — CBC WITH DIFFERENTIAL (CANCER CENTER ONLY)
Basophils Absolute: 0.1 10*3/uL (ref 0.0–0.1)
Basophils Relative: 2 %
EOS ABS: 0 10*3/uL (ref 0.0–0.5)
EOS PCT: 1 %
HCT: 43.8 % (ref 34.8–46.6)
HEMOGLOBIN: 14.8 g/dL (ref 11.6–15.9)
LYMPHS ABS: 1 10*3/uL (ref 0.9–3.3)
LYMPHS PCT: 20 %
MCH: 33.4 pg (ref 25.1–34.0)
MCHC: 33.7 g/dL (ref 31.5–36.0)
MCV: 99.1 fL (ref 79.5–101.0)
Monocytes Absolute: 0.5 10*3/uL (ref 0.1–0.9)
Monocytes Relative: 10 %
NEUTROS PCT: 67 %
Neutro Abs: 3.3 10*3/uL (ref 1.5–6.5)
PLATELETS: 183 10*3/uL (ref 145–400)
RBC: 4.42 MIL/uL (ref 3.70–5.45)
RDW: 15.1 % — ABNORMAL HIGH (ref 11.2–14.5)
WBC: 4.9 10*3/uL (ref 3.9–10.3)

## 2018-04-16 LAB — CMP (CANCER CENTER ONLY)
ALK PHOS: 73 U/L (ref 38–126)
ALT: 36 U/L (ref 0–44)
AST: 47 U/L — AB (ref 15–41)
Albumin: 4 g/dL (ref 3.5–5.0)
Anion gap: 9 (ref 5–15)
BUN: 8 mg/dL (ref 8–23)
CALCIUM: 9.6 mg/dL (ref 8.9–10.3)
CHLORIDE: 94 mmol/L — AB (ref 98–111)
CO2: 31 mmol/L (ref 22–32)
CREATININE: 0.75 mg/dL (ref 0.44–1.00)
GFR, Est AFR Am: >60 mL/min (ref >60)
GFR, Estimated: >60 mL/min (ref >60)
Glucose, Bld: 97 mg/dL (ref 70–99)
Potassium: 3.6 mmol/L (ref 3.5–5.1)
SODIUM: 134 mmol/L — AB (ref 135–145)
Total Bilirubin: 1 mg/dL (ref 0.3–1.2)
Total Protein: 7.6 g/dL (ref 6.5–8.1)

## 2018-04-16 LAB — LACTATE DEHYDROGENASE: LDH: 316 U/L — ABNORMAL HIGH (ref 98–192)

## 2018-04-16 NOTE — Telephone Encounter (Signed)
Attempted to contact the patient and left message for patient to call the office.  

## 2018-04-16 NOTE — Telephone Encounter (Addendum)
Patient states she has been taking Imuran and stopped it on 04/11/18. Patient states she has been vomiting for about 2 weeks. Patient states she has been taking the prescription for about 27 days and only started vomiting about 2 weeks ago. Patient states she did not have any nausea the first two weeks. Patient states she has not take any Prednisone or Imuran since 04/11/18. Patient states she has been having some soreness in her shoulders and in her abdomen. Please advise.

## 2018-04-16 NOTE — Telephone Encounter (Signed)
Gave patient avs and calendar of upcoming appts.  °

## 2018-04-16 NOTE — Progress Notes (Addendum)
[facility] Follow up:    Deanna Hart, [place] [address]   DIAGNOSIS: MGUS  SUMMARY OF HEMATOLOGIC HISTORY:  1. Noted to have M spike in 04/2017, no end organ damage noted.  Elevated IgG, and Ig Kappa and Lamda free light chain. She has been followed with labs which have been stable.    2. Skeletal Survey: done on 06/22/2017 and negative  CURRENT THERAPY: Observation  INTERVAL HISTORY: Deanna Hart 77 y.o. female returns for evaluation and continued follow up of her MGUS.  She is doing well today.  She denies any issues.  She continues to f/u with rheumatology for her lupus.  She sees Dr. Estanislado Pandy.  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    Deanna Hart is doing very well otherwise.  She continues to work Tax inspector.  She stays active.  She denies any issues today other than recent difficulty tolerating Imuran prescribed by her rheumatologist.     Patient Active Problem List   Diagnosis Date Noted  . MGUS (monoclonal gammopathy of unknown significance) 06/01/2017  . Hyperuricemia 06/01/2017  . Elevated LFTs 03/03/2017  . Acute pain of right shoulder 11/28/2016  . High risk medications (not anticoagulants) long-term use 07/19/2016  . Rheumatoid factor positive 07/19/2016  . SLE (systemic lupus erythematosus) ([place]) 07/18/2016  . Primary osteoarthritis of both hands 07/18/2016  . Primary osteoarthritis of both feet 07/18/2016  . Calcaneal spur of both feet 07/18/2016  . Osteoarthritis of both knees 07/18/2016  . Hypertension 07/18/2016  . Obesity 07/18/2016  . Vitamin D deficiency 07/18/2016    is allergic to plaquenil [hydroxychloroquine sulfate].  MEDICAL HISTORY: Past Medical History:  Diagnosis Date  . Calcaneal spur of both feet 07/18/2016  . Hypertension 07/18/2016  . Obesity 07/18/2016  . Osteoarthritis of both feet 07/18/2016  . Osteoarthritis of both hands 07/18/2016  .  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   Marland Kitchen SLE (systemic lupus erythematosus) ([place]) 07/18/2016   Positive ANA, Positive Ro, Positive Smith, Positive RNP, Positive RF, Negative CCP,  Elevated LFTs  . Vitamin D deficiency 07/18/2016    SURGICAL HISTORY: Past Surgical History:  Procedure Laterality Date  . BREAST SURGERY    . EYE SURGERY Bilateral 07/2017   cataracts    SOCIAL HISTORY: Social History   Socioeconomic History  . Marital status: Widowed    Spouse name: Not on file  . Number of children: Not on file  . Years of education: Not on file  . Highest education level: Not on file  Occupational History  . Not on file  Social Needs  . Financial resource strain: Not on file  . Food insecurity:    Worry: Not on file    Inability: Not on file  . Transportation needs:    Medical: Not on file    Non-medical: Not on file  Tobacco Use  . Smoking status: Former Smoker    Packs/day: 0.10    Years: 3.00    Pack years: 0.30    Types: Cigarettes  . Smokeless tobacco: Former Systems developer    Types: Chew  Substance and Sexual Activity  . Alcohol use: No  . Drug use: Never  . Sexual activity: Not on file  Lifestyle  . Physical activity:    Days per week: Not on file    Minutes per session: Not on file  . Stress: Not on file  Relationships  .  Social connections:    Talks on phone: Not on file    Gets together: Not on file    Attends religious service: Not on file    Active member of club or organization: Not on file    Attends meetings of clubs or organizations: Not on file    Relationship status: Not on file  . Intimate partner violence:    Fear of current or ex partner: Not on file    Emotionally abused: Not on file    Physically abused: Not on file    Forced sexual activity: Not on file  Other Topics Concern  . Not on file  Social History Narrative  . Not on file    FAMILY HISTORY: Family History  Problem Relation Age of Onset  .  Cancer Brother   . Cancer Maternal Uncle     Review of Systems  Constitutional: Negative for appetite change, chills, fatigue, fever and unexpected weight change.  HENT:   Negative for hearing loss and trouble swallowing.   Eyes: Negative for eye problems and icterus.  Respiratory: Negative for chest tightness, cough and shortness of breath.   Cardiovascular: Negative for chest pain and leg swelling.  Gastrointestinal: Positive for nausea (related to intolerance of imuran) and vomiting (related to intolerance of imuran). Negative for abdominal distention, abdominal pain and diarrhea.  Endocrine: Negative for hot flashes.  Neurological: Negative for dizziness, extremity weakness and headaches.  Hematological: Negative for adenopathy. Does not bruise/bleed easily.  Psychiatric/Behavioral: Negative for depression. The patient is not nervous/anxious.       PHYSICAL EXAMINATION  ECOG PERFORMANCE STATUS: 1 - Symptomatic but completely ambulatory  Vitals:   04/16/18 1254 04/16/18 1310  BP: (!) 141/89   Pulse: (!) 101   Resp: 18   Temp: 98.8 F (37.1 C)   SpO2: (!) 84% 97%  oxygen saturation rechecked by Thompson Caul, LPN, and it was 67-124%  Physical Exam  Constitutional: She is oriented to person, place, and time. She appears well-developed and well-nourished.  HENT:  Head: Normocephalic and atraumatic.  Mouth/Throat: Oropharynx is clear and moist. No oropharyngeal exudate.  Eyes: Pupils are equal, round, and reactive to light. No scleral icterus.  Neck: Neck supple.  Cardiovascular: Normal rate, regular rhythm and normal heart sounds.  Pulmonary/Chest: Effort normal and breath sounds normal.  Abdominal: Soft. Bowel sounds are normal.  Musculoskeletal: She exhibits no edema.  Lymphadenopathy:    She has no cervical adenopathy.  Neurological: She is alert and oriented to person, place, and time.  Skin: Skin is warm and dry. Capillary refill takes less than 2 seconds. No rash  noted.  Psychiatric: She has a normal mood and affect.    LABORATORY DATA:  CBC    Component Value Date/Time   WBC 4.9 04/16/2018 1347   WBC 5.5 04/12/2018 0910   RBC 4.42 04/16/2018 1347   HGB 14.8 04/16/2018 1347   HGB 13.1 05/30/2017 1130   HCT 43.8 04/16/2018 1347   HCT 38.9 05/30/2017 1130   PLT 183 04/16/2018 1347   PLT 178 05/30/2017 1130   MCV 99.1 04/16/2018 1347   MCV 96.5 05/30/2017 1130   MCH 33.4 04/16/2018 1347   MCHC 33.7 04/16/2018 1347   RDW 15.1 (H) 04/16/2018 1347   RDW 14.1 05/30/2017 1130   LYMPHSABS 1.0 04/16/2018 1347   LYMPHSABS 1.3 05/30/2017 1130   MONOABS 0.5 04/16/2018 1347   MONOABS 0.5 05/30/2017 1130   EOSABS 0.0 04/16/2018 1347   EOSABS 0.0 05/30/2017 1130  BASOSABS 0.1 04/16/2018 1347   BASOSABS 0.0 05/30/2017 1130    CMP     Component Value Date/Time   NA 134 (L) 04/16/2018 1347   NA 137 05/30/2017 1130   K 3.6 04/16/2018 1347   K 3.3 (L) 05/30/2017 1130   CL 94 (L) 04/16/2018 1347   CO2 31 04/16/2018 1347   CO2 29 05/30/2017 1130   GLUCOSE 97 04/16/2018 1347   GLUCOSE 96 05/30/2017 1130   BUN 8 04/16/2018 1347   BUN 13.2 05/30/2017 1130   CREATININE 0.75 04/16/2018 1347   CREATININE 0.69 04/12/2018 0910   CREATININE 0.8 05/30/2017 1130   CALCIUM 9.6 04/16/2018 1347   CALCIUM 9.4 05/30/2017 1130   PROT 7.6 04/16/2018 1347   PROT 8.3 07/11/2017 0926   PROT 8.5 (H) 05/30/2017 1130   ALBUMIN 4.0 04/16/2018 1347   ALBUMIN 4.0 07/11/2017 0926   ALBUMIN 3.6 05/30/2017 1130   AST 47 (H) 04/16/2018 1347   AST 171 Repeated and Verified (HH) 05/30/2017 1130   ALT 36 04/16/2018 1347   ALT 106 (H) 05/30/2017 1130   ALKPHOS 73 04/16/2018 1347   ALKPHOS 88 07/11/2017 0926   ALKPHOS 82 05/30/2017 1130   BILITOT 1.0 04/16/2018 1347   BILITOT 0.77 05/30/2017 1130   GFRNONAA >60 04/16/2018 1347   GFRNONAA 83 04/12/2018 0910   GFRAA >60 04/16/2018 1347   GFRAA 97 04/12/2018 0910     ASSESSMENT and PLAN:   MGUS (monoclonal  gammopathy of unknown significance) Deanna Hart has had MGUS that we have been following since 04/2017 that has remained stable. Her bone survey from 06/2017 was negative as well.  Her labs have been monitored every 3-4 months since then and have remained stable.    Deanna Hart met with myself and Dr. Lindi Adie today to review her history of MGUS and her work up thus far.  Her M spike has remained stable.  We will repeat a set of myeloma labs today, and then we will set her up to see Dr. Lindi Adie in 6 months with labs one week prior.    Deanna Hart understands that this is a benign condition that occasionally can transform into multiple myeloma, and that is why we will monitor her periodically.     Orders Placed This Encounter  Procedures  . CBC with Differential (Cancer Center Only)    Standing Status:   Standing    Number of Occurrences:   4    Standing Expiration Date:   04/17/2019  . CMP ([facility] only)    Standing Status:   Standing    Number of Occurrences:   4    Standing Expiration Date:   04/17/2019  . Beta 2 microglobulin    Standing Status:   Standing    Number of Occurrences:   4    Standing Expiration Date:   04/17/2019  . Kappa/lambda light chains    Standing Status:   Standing    Number of Occurrences:   4    Standing Expiration Date:   04/17/2019  . Lactate dehydrogenase (LDH)    Standing Status:   Standing    Number of Occurrences:   4    Standing Expiration Date:   04/17/2019  . Multiple Myeloma Panel (SPEP&IFE w/QIG)    Standing Status:   Standing    Number of Occurrences:   4    Standing Expiration Date:   04/17/2019    All questions were answered. The patient knows to call the clinic with any  problems, questions or concerns. We can certainly see the patient much sooner if necessary.  A total of (30) minutes of face-to-face time was spent with this patient with greater than 50% of that time in counseling and care-coordination.  This note was electronically  signed. Scot Dock, NP 04/17/2018   Attending Note  I personally saw and examined Deanna Hart. The plan of care was discussed with her. I agree with the assessment and plan as documented above. MGUS: Reviewed the lab results with the patient and provided her with a copy of these reports. There is no indication for treatment or any additional testing at this time. We will continue to watch and monitor her M protein levels every 6 months.  I counseled her about MGUS and the difference between MGUS and myeloma.  Signed Harriette Ohara, MD

## 2018-04-16 NOTE — Telephone Encounter (Signed)
Patient left a message requesting a call back to discuss medication given last. Per patient that medicine made her throw up. She has not had any meds since 7/24. Please call patient to advise.

## 2018-04-17 ENCOUNTER — Other Ambulatory Visit: Payer: Self-pay | Admitting: Hematology and Oncology

## 2018-04-17 LAB — BETA 2 MICROGLOBULIN, SERUM: Beta-2 Microglobulin: 2.2 mg/L (ref 0.6–2.4)

## 2018-04-17 LAB — KAPPA/LAMBDA LIGHT CHAINS
Kappa free light chain: 21.5 mg/L — ABNORMAL HIGH (ref 3.3–19.4)
Kappa, lambda light chain ratio: 1.17 (ref 0.26–1.65)
Lambda free light chains: 18.4 mg/L (ref 5.7–26.3)

## 2018-04-17 NOTE — Telephone Encounter (Signed)
Please advise patient to follow up with Dr. Benson Norway for evaluation ASAP. Imuran can cause GI upset. She should continue to hold Imuran and Prednisone at this time.

## 2018-04-17 NOTE — Telephone Encounter (Signed)
Patient advised to make an appointment with Dr. Benson Norway as soon as possible. Patient advised she can continue to hold Imuran and Prednisone

## 2018-04-17 NOTE — Assessment & Plan Note (Signed)
Toshi has had MGUS that we have been following since 04/2017 that has remained stable. Her bone survey from 06/2017 was negative as well.  Her labs have been monitored every 3-4 months since then and have remained stable.    Myriah met with myself and Dr. Lindi Adie today to review her history of MGUS and her work up thus far.  Her M spike has remained stable.  We will repeat a set of myeloma labs today, and then we will set her up to see Dr. Lindi Adie in 6 months with labs one week prior.    Vineta understands that this is a benign condition that occasionally can transform into multiple myeloma, and that is why we will monitor her periodically.

## 2018-04-18 LAB — MULTIPLE MYELOMA PANEL, SERUM
ALPHA 1: 0.3 g/dL (ref 0.0–0.4)
Albumin SerPl Elph-Mcnc: 3.3 g/dL (ref 2.9–4.4)
Albumin/Glob SerPl: 0.9 (ref 0.7–1.7)
Alpha2 Glob SerPl Elph-Mcnc: 0.9 g/dL (ref 0.4–1.0)
B-GLOBULIN SERPL ELPH-MCNC: 1.3 g/dL (ref 0.7–1.3)
GAMMA GLOB SERPL ELPH-MCNC: 1.2 g/dL (ref 0.4–1.8)
Globulin, Total: 3.7 g/dL (ref 2.2–3.9)
IgA: 254 mg/dL (ref 64–422)
IgG (Immunoglobin G), Serum: 1235 mg/dL (ref 700–1600)
IgM (Immunoglobulin M), Srm: 22 mg/dL — ABNORMAL LOW (ref 26–217)
M Protein SerPl Elph-Mcnc: 0.3 g/dL — ABNORMAL HIGH
Total Protein ELP: 7 g/dL (ref 6.0–8.5)

## 2018-04-19 ENCOUNTER — Telehealth: Payer: Self-pay

## 2018-04-19 NOTE — Telephone Encounter (Signed)
-----   Message from Gardenia Phlegm, NP sent at 04/19/2018  8:59 AM EDT ----- Please call patient and tell her her labs look stable.    Thanks,  Mendel Ryder ----- Message ----- From: Buel Ream, [facility] [place] Sent: 04/16/2018   2:14 PM To: Gardenia Phlegm, NP

## 2018-04-19 NOTE — Telephone Encounter (Signed)
Spoke with pt about stable lab results. Pt happy hear it.  Thanked nurse for call.  No questions/concerns at this time.

## 2018-04-24 NOTE — Progress Notes (Signed)
Office Visit Note  Patient: Deanna Hart             Date of Birth: April 09, 1941           MRN: [id number]             PCP: [name], MD Referring: [name], MD Visit Date: 05/08/2018 Occupation: @GUAROCC @  Subjective:  Left ankle pain   History of Present Illness: Deanna Hart is a 77 y.o. female with history of systemic lupus erythematosus and osteoarthritis.  Patient reports that she discontinued Imuran on 04/11/2018.  She states she cannot tolerate taking it due to nausea and vomiting.  She states she vomited daily for 2 weeks.  She states that she followed up with Dr. Almyra Free who restarted her on prednisone 5 mg twice daily.  She has been taking Phenergan PRN for nausea and vomiting.  She reports that in the past she could not tolerate taking Plaquenil due to GI side effects as well.  She states that she has been to follow-up with Dr. Almyra Free on 09/10/2018.  She reports that she continues to have left ankle pain and swelling.  She states the swelling is more severe in the evening after working.  She denies any other joint pain or joint swelling at this time.  She states that she has been having some muscle spasms in her back.  She states that she is going to try using Tiger balm and has been using a heating pad.  She denies any sores in her mouth or nose.  She denies any worsening fatigue or low-grade fevers.  She denies any swollen lymph nodes.  Denies any rashes or hair loss.   Activities of Daily Living:  Patient reports morning stiffness for 5 minutes.   Patient Denies nocturnal pain.  Difficulty dressing/grooming: Denies Difficulty climbing stairs: Reports Difficulty getting out of chair: Denies Difficulty using hands for taps, buttons, cutlery, and/or writing: Denies  Review of Systems  Constitutional: Positive for fatigue.  HENT: Negative for mouth sores, mouth dryness and nose dryness.   Eyes: Negative for pain, visual disturbance and dryness.    Respiratory: Negative for cough, hemoptysis, shortness of breath and difficulty breathing.   Cardiovascular: Negative for chest pain, palpitations, hypertension and swelling in legs/feet.  Gastrointestinal: Negative for blood in stool, constipation and diarrhea.  Endocrine: Negative for increased urination.  Genitourinary: Negative for painful urination.  Musculoskeletal: Positive for arthralgias, joint pain, joint swelling, morning stiffness and muscle tenderness. Negative for myalgias, muscle weakness and myalgias.  Skin: Negative for color change, pallor, rash, hair loss, nodules/bumps, skin tightness, ulcers and sensitivity to sunlight.  Allergic/Immunologic: Negative for susceptible to infections.  Neurological: Negative for dizziness, numbness, headaches and weakness.  Hematological: Negative for swollen glands.  Psychiatric/Behavioral: Negative for depressed mood and sleep disturbance. The patient is not nervous/anxious.     PMFS History:  Patient Active Problem List   Diagnosis Date Noted  . MGUS (monoclonal gammopathy of unknown significance) 06/01/2017  . Hyperuricemia 06/01/2017  . Elevated LFTs 03/03/2017  . Acute pain of right shoulder 11/28/2016  . High risk medications (not anticoagulants) long-term use 07/19/2016  . Rheumatoid factor positive 07/19/2016  . SLE (systemic lupus erythematosus) ([facility]) 07/18/2016  . Primary osteoarthritis of both hands 07/18/2016  . Primary osteoarthritis of both feet 07/18/2016  . Calcaneal spur of both feet 07/18/2016  . Osteoarthritis of both knees 07/18/2016  . Hypertension 07/18/2016  . Obesity 07/18/2016  . Vitamin D deficiency 07/18/2016  Past Medical History:  Diagnosis Date  . Calcaneal spur of both feet 07/18/2016  . Hypertension 07/18/2016  . Obesity 07/18/2016  . Osteoarthritis of both feet 07/18/2016  . Osteoarthritis of both hands 07/18/2016  . 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    Marland Kitchen SLE (systemic lupus erythematosus) ([facility]) 07/18/2016   Positive ANA, Positive Ro, Positive Smith, Positive RNP, Positive RF, Negative CCP,  Elevated LFTs  . Vitamin D deficiency 07/18/2016    Family History  Problem Relation Age of Onset  . Cancer Brother   . Cancer Maternal Uncle    Past Surgical History:  Procedure Laterality Date  . BREAST SURGERY    . EYE SURGERY Bilateral 07/2017   cataracts   Social History   Social History Narrative  . Not on file    Objective: Vital Signs: BP (!) 143/92 (BP Location: Left Arm, Patient Position: Sitting, Cuff Size: Normal)   Pulse 85   Resp 13   Ht 5\' 4"  (1.626 m)   Wt 219 lb 3.2 oz (99.4 kg)   BMI 37.63 kg/m    Physical Exam  Constitutional: She is oriented to person, place, and time. She appears well-developed and well-nourished.  HENT:  Head: Normocephalic and atraumatic.  No oral or nasal ulcerations.  No parotid swelling.  Eyes: Conjunctivae and EOM are normal.  Neck: Normal range of motion.  Cardiovascular: Normal rate, regular rhythm, normal heart sounds and intact distal pulses.  Pedal edema bilaterally.  Pulmonary/Chest: Effort normal and breath sounds normal.  Abdominal: Soft. Bowel sounds are normal.  Lymphadenopathy:    She has no cervical adenopathy.  Neurological: She is alert and oriented to person, place, and time.  Skin: Skin is warm and dry. Capillary refill takes less than 2 seconds.  No malar rash.  No digital ulcerations or signs of gangrene noted.  Psychiatric: She has a normal mood and affect. Her behavior is normal.  Nursing note and vitals reviewed.    Musculoskeletal Exam: C-spine, thoracic spine, and lumbar spine good ROM.  No midline spinal tenderness.  No SI joint tenderness.  Shoulder joints, elbow joints, wrist joints, MCPs, PIPs, and DIPs good ROM with no synovitis.  Complete fist formation bilaterally.  Hip joints and knee joints good ROM.  No warmth or effusion of knee joints.  Left ankle  warmth and swelling.   CDAI Exam: No CDAI exam completed.   Investigation: No additional findings.  Imaging: No results found.  Recent Labs: Lab Results  Component Value Date   WBC 4.9 04/16/2018   HGB 14.8 04/16/2018   PLT 183 04/16/2018   NA 134 (L) 04/16/2018   K 3.6 04/16/2018   CL 94 (L) 04/16/2018   CO2 31 04/16/2018   GLUCOSE 97 04/16/2018   BUN 8 04/16/2018   CREATININE 0.75 04/16/2018   BILITOT 1.0 04/16/2018   ALKPHOS 73 04/16/2018   AST 47 (H) 04/16/2018   ALT 36 04/16/2018   PROT 7.6 04/16/2018   ALBUMIN 4.0 04/16/2018   CALCIUM 9.6 04/16/2018   GFRAA >60 04/16/2018    Speciality Comments: No specialty comments available.  Procedures:  No procedures performed Allergies: Plaquenil [hydroxychloroquine sulfate]   Assessment / Plan:     Visit Diagnoses: Other systemic lupus erythematosus with other organ involvement ([place]) - +ANA +Ro +Smith +RNP + RF neg CCP / h/o skin biopsy + by Dr Ronnald Ramp: She has not had any recent signs or symptoms of lupus flare.  She is  currently on prednisone 5 mg twice daily.  She has warmth and swelling of the left ankle joint.  She is not expressing any joint pain or joint swelling at this time.  She has no oral or nasal ulcerations on exam.  She has no sicca symptoms.  No cervical lymphadenopathy was palpable.  She has not had any recent rashes or skin lesions.  She denies symptoms of Raynaud's and no digital ulcerations were noted.  In the past she could not tolerate taking Plaquenil due to GI side effects.  She was placed on Imuran low-dose for treatment of elevated LFTs but could not tolerate it due to nausea and vomiting.  She will be returning to Dr. Ulyses Amor office in 1 month for lab work.  We will check autoimmune labs at her next visit.  High risk medication use - could not tolerate Imuran due to GI SE.  She is on Prednisone 5 mg BID.  She could not tolerate PLQ in the past due to GI SE.   Elevated LFTs: AST was 47 and ALT was WNL  on 04/16/18.  She was started on low dose Imuran in July 2019, which she took for 2 weeks but had to discontinue due to experiencing nausea and vomiting (d/c 04/11/18).  She followed up with Dr. Benson Norway on 04/18/18 who started her on Prednisone 5 mg BID and Phenergan 25 mg 1 tablet as needed every 6 hours for nausea and vomiting.  He plans on rechecking her LFTs in 1 month.  Primary osteoarthritis of both hands: She has no synovitis or tenderness.  She has complete fist formation with no discomfort.  Joint protection and muscle strengthening were discussed.   Primary osteoarthritis of both knees: No warmth or effusion of knee joints.  Good ROM with no discomfort.    Primary osteoarthritis of both feet: She has mild osteoarthritic changes in both feet.  She has left ankle warmth and swelling on exam.  She is currently on Prednisone 5 mg BID.   Other medical conditions are listed as follows:   MGUS (monoclonal gammopathy of unknown significance)  Vitamin D deficiency  Essential hypertension   Orders: No orders of the defined types were placed in this encounter.  No orders of the defined types were placed in this encounter.   Face-to-face time spent with patient was 30 minutes. Greater than 50% of time was spent in counseling and coordination of care.  Follow-Up Instructions: Return in about 3 months (around 08/08/2018) for Systemic lupus erythematosus, Osteoarthritis.   [name], PA-C  Note - This record has been created using Dragon software.  Chart creation errors have been sought, but may not always  have been located. Such creation errors do not reflect on  the standard of medical care.

## 2018-05-08 ENCOUNTER — Encounter: Payer: Self-pay | Admitting: Physician Assistant

## 2018-05-08 ENCOUNTER — Ambulatory Visit: Payer: Medicare HMO | Admitting: Physician Assistant

## 2018-05-08 VITALS — BP 143/92 | HR 85 | Resp 13 | Ht 64.0 in | Wt 219.2 lb

## 2018-05-08 DIAGNOSIS — M19042 Primary osteoarthritis, left hand: Secondary | ICD-10-CM

## 2018-05-08 DIAGNOSIS — Z79899 Other long term (current) drug therapy: Secondary | ICD-10-CM

## 2018-05-08 DIAGNOSIS — M3219 Other organ or system involvement in systemic lupus erythematosus: Secondary | ICD-10-CM

## 2018-05-08 DIAGNOSIS — M19071 Primary osteoarthritis, right ankle and foot: Secondary | ICD-10-CM

## 2018-05-08 DIAGNOSIS — K754 Autoimmune hepatitis: Secondary | ICD-10-CM

## 2018-05-08 DIAGNOSIS — D472 Monoclonal gammopathy: Secondary | ICD-10-CM

## 2018-05-08 DIAGNOSIS — M19041 Primary osteoarthritis, right hand: Secondary | ICD-10-CM

## 2018-05-08 DIAGNOSIS — M170 Bilateral primary osteoarthritis of knee: Secondary | ICD-10-CM

## 2018-05-08 DIAGNOSIS — M19072 Primary osteoarthritis, left ankle and foot: Secondary | ICD-10-CM

## 2018-05-08 DIAGNOSIS — R7989 Other specified abnormal findings of blood chemistry: Secondary | ICD-10-CM

## 2018-05-08 DIAGNOSIS — R945 Abnormal results of liver function studies: Secondary | ICD-10-CM | POA: Diagnosis not present

## 2018-05-08 DIAGNOSIS — I10 Essential (primary) hypertension: Secondary | ICD-10-CM

## 2018-05-08 DIAGNOSIS — E559 Vitamin D deficiency, unspecified: Secondary | ICD-10-CM

## 2018-07-02 ENCOUNTER — Emergency Department (HOSPITAL_COMMUNITY)
Admission: EM | Admit: 2018-07-02 | Discharge: 2018-07-02 | Disposition: A | Discharge status: Home / Self Care | Payer: Medicare HMO | Attending: Emergency Medicine | Admitting: Emergency Medicine

## 2018-07-02 ENCOUNTER — Ambulatory Visit (HOSPITAL_BASED_OUTPATIENT_CLINIC_OR_DEPARTMENT_OTHER): Payer: Medicare HMO

## 2018-07-02 ENCOUNTER — Encounter (HOSPITAL_COMMUNITY): Payer: Self-pay

## 2018-07-02 ENCOUNTER — Other Ambulatory Visit: Payer: Self-pay

## 2018-07-02 DIAGNOSIS — L539 Erythematous condition, unspecified: Secondary | ICD-10-CM | POA: Diagnosis present

## 2018-07-02 DIAGNOSIS — I10 Essential (primary) hypertension: Secondary | ICD-10-CM | POA: Insufficient documentation

## 2018-07-02 DIAGNOSIS — Z87891 Personal history of nicotine dependence: Secondary | ICD-10-CM | POA: Insufficient documentation

## 2018-07-02 DIAGNOSIS — L0390 Cellulitis, unspecified: Secondary | ICD-10-CM | POA: Diagnosis not present

## 2018-07-02 DIAGNOSIS — Z79899 Other long term (current) drug therapy: Secondary | ICD-10-CM | POA: Diagnosis not present

## 2018-07-02 DIAGNOSIS — R2242 Localized swelling, mass and lump, left lower limb: Secondary | ICD-10-CM | POA: Diagnosis not present

## 2018-07-02 LAB — COMPREHENSIVE METABOLIC PANEL
ALK PHOS: 64 U/L (ref 38–126)
ALT: 69 U/L — AB (ref 0–44)
AST: 104 U/L — AB (ref 15–41)
Albumin: 3.9 g/dL (ref 3.5–5.0)
Anion gap: 13 (ref 5–15)
BILIRUBIN TOTAL: 0.8 mg/dL (ref 0.3–1.2)
BUN: 15 mg/dL (ref 8–23)
CALCIUM: 9.4 mg/dL (ref 8.9–10.3)
CHLORIDE: 98 mmol/L (ref 98–111)
CO2: 28 mmol/L (ref 22–32)
CREATININE: 0.6 mg/dL (ref 0.44–1.00)
GFR calc Af Amer: >60 mL/min (ref >60)
GFR calc non Af Amer: >60 mL/min (ref >60)
Glucose, Bld: 115 mg/dL — ABNORMAL HIGH (ref 70–99)
Potassium: 3.7 mmol/L (ref 3.5–5.1)
Sodium: 139 mmol/L (ref 135–145)
Total Protein: 8.2 g/dL — ABNORMAL HIGH (ref 6.5–8.1)

## 2018-07-02 LAB — PROTIME-INR
INR: 0.92
PROTHROMBIN TIME: 12.3 s (ref 11.4–15.2)

## 2018-07-02 LAB — CBC WITH DIFFERENTIAL/PLATELET
Abs Immature Granulocytes: 0.08 10*3/uL — ABNORMAL HIGH (ref 0.00–0.07)
Basophils Absolute: 0 10*3/uL (ref 0.0–0.1)
Basophils Relative: 0 %
EOS ABS: 0 10*3/uL (ref 0.0–0.5)
EOS PCT: 0 %
HCT: 43.9 % (ref 36.0–46.0)
HEMOGLOBIN: 14.6 g/dL (ref 12.0–15.0)
Immature Granulocytes: 2 %
LYMPHS PCT: 14 %
Lymphs Abs: 0.7 10*3/uL (ref 0.7–4.0)
MCH: 33.3 pg (ref 26.0–34.0)
MCHC: 33.3 g/dL (ref 30.0–36.0)
MCV: 100.2 fL — ABNORMAL HIGH (ref 80.0–100.0)
MONO ABS: 0.6 10*3/uL (ref 0.1–1.0)
Monocytes Relative: 11 %
Neutro Abs: 4 10*3/uL (ref 1.7–7.7)
Neutrophils Relative %: 73 %
Platelets: 208 10*3/uL (ref 150–400)
RBC: 4.38 MIL/uL (ref 3.87–5.11)
RDW: 13.2 % (ref 11.5–15.5)
WBC: 5.4 10*3/uL (ref 4.0–10.5)
nRBC: 0 % (ref 0.0–0.2)

## 2018-07-02 MED ORDER — CLINDAMYCIN HCL 150 MG PO CAPS: 300.0000 mg | ORAL_CAPSULE | Freq: Three times a day (TID) | ORAL | 0 refills | Status: DC

## 2018-07-02 NOTE — ED Provider Notes (Signed)
[facility] DEPT Provider Note   CSN: [id number] Arrival date & time: 07/02/18  1205     History   Chief Complaint Chief Complaint  Patient presents with  . Cellulitis    HPI Deanna Hart is a 77 y.o. female who presents the emergency department with for evaluation of left leg erythema.  Patient has a past medical history of monoclonal gammopathy, SLE, autoimmune hepatitis, she is chronically immunocompromised on prednisone.  She is followed by rheumatology.  Patient presented to her primary care doctor last Thursday and was diagnosed with cellulitis of the left lower extremity.  He states that she had swelling of the left lower extremity up to the proximal calf with associated erythema and pain.  She was placed on Augmentin.  She states that she went to bed normally last night but then around 2 AM she started having itching.  When she awoke this morning she had swelling of her left joint, significant erythema spreading up her leg.  She denies pain, fever, shaking chills.  She denies pain with movement of the left joint anymore than her normal osteoarthritis produces.  She does not take a blood thinner.  She denies any trauma.  She did not scratch her leg.  HPI  Past Medical History:  Diagnosis Date  . Calcaneal spur of both feet 07/18/2016  . Hypertension 07/18/2016  . Obesity 07/18/2016  . Osteoarthritis of both feet 07/18/2016  . Osteoarthritis of both hands 07/18/2016  . 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   Marland Kitchen SLE (systemic lupus erythematosus) ([place]) 07/18/2016   Positive ANA, Positive Ro, Positive Smith, Positive RNP, Positive RF, Negative CCP,  Elevated LFTs  . Vitamin D deficiency 07/18/2016    Patient Active Problem List   Diagnosis Date Noted  . MGUS (monoclonal gammopathy of unknown significance) 06/01/2017  . Hyperuricemia 06/01/2017  . Elevated LFTs 03/03/2017  . Acute pain of right shoulder  11/28/2016  . High risk medications (not anticoagulants) long-term use 07/19/2016  . Rheumatoid factor positive 07/19/2016  . SLE (systemic lupus erythematosus) ([place]) 07/18/2016  . Primary osteoarthritis of both hands 07/18/2016  . Primary osteoarthritis of both feet 07/18/2016  . Calcaneal spur of both feet 07/18/2016  . Osteoarthritis of both knees 07/18/2016  . Hypertension 07/18/2016  . Obesity 07/18/2016  . Vitamin D deficiency 07/18/2016    Past Surgical History:  Procedure Laterality Date  . BREAST SURGERY    . EYE SURGERY Bilateral 07/2017   cataracts     OB History   None      Home Medications    Prior to Admission medications   Medication Sig Start Date End Date Taking? Authorizing Provider  allopurinol (ZYLOPRIM) 300 MG tablet TAKE 1 TABLET(300 MG) BY MOUTH DAILY 07/31/17   Lebron Conners, Marinell Blight, MD  amLODipine (NORVASC) 5 MG tablet Take 5 mg by mouth daily.  05/16/16   [provider]  predniSONE (DELTASONE) 10 MG tablet Take 4 tablets by mouth at breakfast daily. Patient taking differently: 10 mg.  02/22/18   Bo Merino, MD  promethazine (PHENERGAN) 25 MG tablet as needed. 04/18/18   [provider]  triamcinolone cream (KENALOG) 0.1 % APP AA BID 12/12/16   [provider]  triamterene-hydrochlorothiazide (MAXZIDE-25) 37.5-25 MG tablet Take 1 tablet by mouth daily.  05/25/16   [provider]    Family History Family History  Problem Relation Age of Onset  . Cancer Brother   .  Cancer Maternal Uncle     Social History Social History   Tobacco Use  . Smoking status: Former Smoker    Packs/day: 0.10    Years: 3.00    Pack years: 0.30    Types: Cigarettes  . Smokeless tobacco: Former Systems developer    Types: Chew  Substance Use Topics  . Alcohol use: No  . Drug use: Never     Allergies   Plaquenil [hydroxychloroquine sulfate]   Review of Systems Review of Systems  Ten systems reviewed and are negative for acute  change, except as noted in the HPI.   Physical Exam Updated Vital Signs BP (!) 172/100 (BP Location: Right Arm)   Pulse (!) 101   Temp 97.9 F (36.6 C) (Oral)   Resp 18   Wt 98.9 kg   SpO2 98%   BMI 37.42 kg/m   Physical Exam  Constitutional: She is oriented to person, place, and time. She appears well-developed and well-nourished. No distress.  HENT:  Head: Normocephalic and atraumatic.  Eyes: Conjunctivae are normal. No scleral icterus.  Neck: Normal range of motion.  Cardiovascular: Normal rate, regular rhythm and normal heart sounds. Exam reveals no gallop and no friction rub.  No murmur heard. Pulmonary/Chest: Effort normal and breath sounds normal. No respiratory distress.  Abdominal: Soft. Bowel sounds are normal. She exhibits no distension and no mass. There is no tenderness. There is no guarding.  Musculoskeletal:  Left lower extremity with swelling of the left knee joint, significant erythema and warmth over the left knee to the medial thigh and proximal medial portion of the left lower extremity.  The erythema is nonblanching.  Nontender to palpation.  Crepitus noted with joint movement without pain.  Neurological: She is alert and oriented to person, place, and time.  Skin: Skin is warm and dry. She is not diaphoretic.  Psychiatric: Her behavior is normal.  Nursing note and vitals reviewed.      ED Treatments / Results  Labs (all labs ordered are listed, but only abnormal results are displayed) Labs Reviewed - No data to display  EKG None  Radiology No results found.  Procedures Procedures (including critical care time)  Medications Ordered in ED Medications - No data to display   Initial Impression / Assessment and Plan / ED Course  I have reviewed the triage vital signs and the nursing notes.  Pertinent labs & imaging results that were available during my care of the patient were reviewed by me and considered in my medical decision making (see  chart for details).     She is here with erythema.  She complains of itching, she denies any pain.  She has no chills or fever.  She denies malaise or body aches.  She is ambulatory and has no pain with movement of the joints.  Dr. Lita Mains and I both saw the patient.  Feels like this may be some type of an allergic reaction.  I discussed options for treatment with the patient and her family who feel comfortable with discharge but would like [place] to switch her to a different antibiotic.  She will be transitioned to clindamycin and she may discontinue using the Augmentin at this time.  Of asked her to follow closely with her primary care physician and follow-up in the next 2 days.  I reviewed the patient's lab work which shows some elevation in her AST and ALT consistent with her chronic autoimmune hepatitis.  She has a macrocytosis without anemia her PT/INR is  within normal limits.  Patient appears appropriate for discharge at this time.  I discussed reasons to seek immediate medical care at the emergency department including any worsening pain, fever, chills, change in mentation. Final Clinical Impressions(s) / ED Diagnoses   Final diagnoses:  Localized swelling of left lower leg    ED Discharge Orders    None       Margarita Mail, PA-C 07/02/18 2232    Julianne Rice, MD 07/04/18 [phone]

## 2018-07-02 NOTE — Progress Notes (Signed)
*  Preliminary Results* Left lower extremity venous duplex completed. Left lower extremity is negative for deep vein thrombosis. There is no evidence of left Baker's cyst.  07/02/2018 3:37 PM  Maudry Mayhew, MHA, RVT, RDCS, RDMS

## 2018-07-02 NOTE — Discharge Instructions (Addendum)
°  HOME CARE INSTRUCTIONS  °Take your antibiotics as directed. Finish them even if you start to feel better. °Keep the infected arm or leg elevated to reduce swelling. °Apply a warm cloth to the affected area up to 4 times per day to relieve pain. °Only take over-the-counter or prescription medicines for pain, discomfort, or fever as directed by your caregiver. °Keep all follow-up appointments as directed by your caregiver. °SEEK MEDICAL CARE IF:  °You notice red streaks coming from the infected area. °Your red area gets larger or turns dark in color. °Your bone or joint underneath the infected area becomes painful after the skin has healed. °Your infection returns in the same area or another area. °You notice a swollen bump in the infected area. °You develop new symptoms. °SEEK IMMEDIATE MEDICAL CARE IF:  °You have a fever. °You feel very sleepy. °You develop vomiting or diarrhea. °You have a general ill feeling (malaise) with muscle aches and pains. ° °

## 2018-07-02 NOTE — ED Triage Notes (Addendum)
Pt arrives via POV from home. Pt reports that she was dx with cellulitis to the left lower extremity on Thursday. Pt reports taking Augmentin 875mg  since Thursday. Pt reports that the redness and swelling has extended up the left leg.. Redness noted to left extremity up past knee.

## 2018-07-31 NOTE — Progress Notes (Signed)
Office Visit Note  Patient: Deanna Hart             Date of Birth: 1941/05/17           MRN: [id number]             PCP: [name], MD Referring: [name], MD Visit Date: 08/14/2018 Occupation: @GUAROCC @  Subjective:  Medication monitoring   History of Present Illness: Deanna Hart is a 77 y.o. female with history of systemic lupus erythematosus, elevated LFTs, and osteoarthritis.  She continues to take Prednisone 10 mg 1 tablet by mouth daily.  She missed her appointment yesterday with Dr. Benson Norway and rescheduled it for 08/20/18. She reports that she was evaluated in the emergency department on 07/02/18 for cellulitis of the right lower extremity.  She was started on Clindamycin which cleared the infection.  She denies any recent fevers or malaise.  She denies any recent lupus flares. She denies any sores in mouth or nose.  She denies any facial rashes or sun sensitivity.  She denies any symptoms of Raynaud's.  She denies any swollen lymph nodes.  She states she has some discomfort in bilateral knee joints but denies any joint swelling.     Activities of Daily Living:  Patient reports morning stiffness for 0 none.   Patient Denies nocturnal pain.  Difficulty dressing/grooming: Denies Difficulty climbing stairs: Reports Difficulty getting out of chair: Denies Difficulty using hands for taps, buttons, cutlery, and/or writing: Denies  Review of Systems  Constitutional: Negative for fatigue.  HENT: Negative for mouth sores, mouth dryness and nose dryness.   Eyes: Negative for pain, visual disturbance and dryness.  Respiratory: Negative for cough, hemoptysis, shortness of breath and difficulty breathing.   Cardiovascular: Positive for swelling in legs/feet. Negative for chest pain, palpitations and hypertension.  Gastrointestinal: Negative for blood in stool, constipation and diarrhea.  Endocrine: Negative for increased urination.  Genitourinary: Negative for  difficulty urinating and painful urination.  Musculoskeletal: Negative for arthralgias, joint pain, joint swelling, myalgias, muscle weakness, morning stiffness, muscle tenderness and myalgias.  Skin: Negative for color change, pallor, rash, hair loss, nodules/bumps, skin tightness, ulcers and sensitivity to sunlight.  Allergic/Immunologic: Negative for susceptible to infections.  Neurological: Negative for dizziness, numbness, headaches and weakness.  Hematological: Negative for bruising/bleeding tendency and swollen glands.  Psychiatric/Behavioral: Negative for depressed mood and sleep disturbance. The patient is not nervous/anxious.     PMFS History:  Patient Active Problem List   Diagnosis Date Noted  . MGUS (monoclonal gammopathy of unknown significance) 06/01/2017  . Hyperuricemia 06/01/2017  . Elevated LFTs 03/03/2017  . Acute pain of right shoulder 11/28/2016  . High risk medications (not anticoagulants) long-term use 07/19/2016  . Rheumatoid factor positive 07/19/2016  . SLE (systemic lupus erythematosus) ([place]) 07/18/2016  . Primary osteoarthritis of both hands 07/18/2016  . Primary osteoarthritis of both feet 07/18/2016  . Calcaneal spur of both feet 07/18/2016  . Osteoarthritis of both knees 07/18/2016  . Hypertension 07/18/2016  . Obesity 07/18/2016  . Vitamin D deficiency 07/18/2016    Past Medical History:  Diagnosis Date  . Calcaneal spur of both feet 07/18/2016  . Hypertension 07/18/2016  . Obesity 07/18/2016  . Osteoarthritis of both feet 07/18/2016  . Osteoarthritis of both hands 07/18/2016  . 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   Marland Kitchen SLE (systemic lupus erythematosus) ([place]) 07/18/2016   Positive ANA, Positive Ro, Positive Smith, Positive RNP, Positive RF, Negative CCP,  Elevated LFTs  . Vitamin D deficiency 07/18/2016    Family History  Problem Relation Age of Onset  . Cancer Brother   . Cancer Maternal Uncle    Past  Surgical History:  Procedure Laterality Date  . BREAST SURGERY    . EYE SURGERY Bilateral 07/2017   cataracts   Social History   Social History Narrative  . Not on file    Objective: Vital Signs: BP (!) 152/90 (BP Location: Left Arm, Patient Position: Sitting, Cuff Size: Normal)   Pulse 76   Resp 18   Ht 5\' 4"  (1.626 m)   Wt 223 lb 9.6 oz (101.4 kg)   BMI 38.38 kg/m    Physical Exam  Constitutional: She is oriented to person, place, and time. She appears well-developed and well-nourished.  HENT:  Head: Normocephalic and atraumatic.  Eyes: Conjunctivae and EOM are normal.  Neck: Normal range of motion.  Cardiovascular: Normal rate, regular rhythm, normal heart sounds and intact distal pulses.  Pulmonary/Chest: Effort normal and breath sounds normal.  Abdominal: Soft. Bowel sounds are normal.  Lymphadenopathy:    She has no cervical adenopathy.  Neurological: She is alert and oriented to person, place, and time.  Skin: Skin is warm and dry. Capillary refill takes less than 2 seconds.  Psychiatric: She has a normal mood and affect. Her behavior is normal.  Nursing note and vitals reviewed.    Musculoskeletal Exam: C-spine, thoracic spine, and lumbar spine good ROM.  No midline spinal tenderness.  No SI joint tenderness.  Shoulder joints, elbow joints, wrist joints, MCPs, PIPs, and DIPs good ROM with no synovitis. Hip joints, knee joints, ankle joints, MTPs, PIPs, and DIPs good ROM with no synovitis.  Mild warmth of the left knee joint.  No warmth or effusion of right knee joints.  No tenderness or swelling of ankle joint.  Pedal edema bilaterally.   CDAI Exam: CDAI Score: Not documented Patient Global Assessment: Not documented; Provider Global Assessment: Not documented Swollen: Not documented; Tender: Not documented Joint Exam   Not documented   There is currently no information documented on the homunculus. Go to the Rheumatology activity and complete the homunculus  joint exam.  Investigation: No additional findings.  Imaging: No results found.  Recent Labs: Lab Results  Component Value Date   WBC 5.4 07/02/2018   HGB 14.6 07/02/2018   PLT 208 07/02/2018   NA 139 07/02/2018   K 3.7 07/02/2018   CL 98 07/02/2018   CO2 28 07/02/2018   GLUCOSE 115 (H) 07/02/2018   BUN 15 07/02/2018   CREATININE 0.60 07/02/2018   BILITOT 0.8 07/02/2018   ALKPHOS 64 07/02/2018   AST 104 (H) 07/02/2018   ALT 69 (H) 07/02/2018   PROT 8.2 (H) 07/02/2018   ALBUMIN 3.9 07/02/2018   CALCIUM 9.4 07/02/2018   GFRAA >60 07/02/2018    Speciality Comments: No specialty comments available.  Procedures:  No procedures performed Allergies: Plaquenil [hydroxychloroquine sulfate]   Assessment / Plan:     Visit Diagnoses: Other systemic lupus erythematosus with other organ involvement ([place]) - +ANA +Ro +Smith +RNP + RF neg CCP / h/o skin biopsy + by Dr Ronnald Ramp -She has not had any recent lupus flares.  She is currently on prednisone 10 mg 1 tablet by mouth daily.  She could not tolerate Imuran or PLQ in the past.  She has no synovitis on exam.  She has no malar rash, palpable cervical lymphadenopathy, oral or nasal ulcerations, and no signs  of symptoms of Raynaud's. She has no sicca symptoms and no parotid swelling noted.  She will be following up with Dr. Benson Norway on 08/20/18.  She will continue on Prednisone 10 mg 1 tablet by mouth daily.  We will check autoimmune labs today. She was advised to notify us if she develops new or worsening symptoms. She will follow up in 5 months. Plan: Anti-DNA antibody, double-stranded, C3 and C4, Sedimentation rate, Urinalysis, Routine w reflex microscopic, COMPLETE METABOLIC PANEL WITH GFR, Anti-Smith antibody  High risk medication use - She could not tolerate Imuran due to GI SE.  She is on Prednisone 10 mg 1 tablet by mouth daily.  She could not tolerate PLQ in the past due to GI SE.   Elevated LFTs - AST 104 and ALT 69 on 07/02/18. She took  low-dose Imuran for 2 weeks in July 2019 but had to discontinue due to experiencing nausea and vomiting (d/c 04/11/18).  She will be following up with Dr. Benson Norway on 08/20/18.  She will continue on Prednisone 10 mg 1 tablet by mouth daily.  CMP was ordered today.   Primary osteoarthritis of both hands: She has mild PIP and DIP synovial thickening.  No synovitis.  Complete fist formation bilaterally.  Joint protection and muscle strengthening discussed.   Primary osteoarthritis of both knees: She has mild warmth of the left knee joint on exam.  She experiences occasional discomfort in both knee joints.  She uses voltaren gel PRN.   Primary osteoarthritis of both feet: She has no discomfort in her feet at this time.  She wears proper fitting shoes.   Other medical conditions are listed as follows:   MGUS (monoclonal gammopathy of unknown significance)  Essential hypertension: She is on Norvasc 5 mg 1 tablet by mouth daily.   Vitamin D deficiency   Orders: Orders Placed This Encounter  Procedures  . Anti-DNA antibody, double-stranded  . C3 and C4  . Sedimentation rate  . Urinalysis, Routine w reflex microscopic  . COMPLETE METABOLIC PANEL WITH GFR  . Anti-Smith antibody   No orders of the defined types were placed in this encounter.   Follow-Up Instructions: Return in about 5 months (around 01/13/2019) for Systemic lupus erythematosus.   Deanna Neas, PA-C   I examined and evaluated the patient with Hazel Sams PA.  His LFTs are elevated again.  I am uncertain if it was due to his recent hospitalization.  She has appointment coming up with Dr. Benson Norway.  I would like his input regarding this matter.  She is on prednisone 10 mg p.o. daily.  She could not tolerate Imuran or Plaquenil in the past.  She is willing to try Imuran again.  I have advised her to discuss this further with Dr. [place] Crane.  The plan of care was discussed as noted above.  Bo Merino, MD  Note - This record has been  created using Editor, commissioning.  Chart creation errors have been sought, but may not always  have been located. Such creation errors do not reflect on  the standard of medical care.

## 2018-08-08 ENCOUNTER — Other Ambulatory Visit: Payer: Self-pay | Admitting: Hematology and Oncology

## 2018-08-09 ENCOUNTER — Other Ambulatory Visit: Payer: Self-pay | Admitting: Hematology and Oncology

## 2018-08-14 ENCOUNTER — Encounter: Payer: Self-pay | Admitting: Physician Assistant

## 2018-08-14 ENCOUNTER — Ambulatory Visit: Payer: Medicare HMO | Admitting: Physician Assistant

## 2018-08-14 VITALS — BP 152/90 | HR 76 | Resp 18 | Ht 64.0 in | Wt 223.6 lb

## 2018-08-14 DIAGNOSIS — M3219 Other organ or system involvement in systemic lupus erythematosus: Secondary | ICD-10-CM

## 2018-08-14 DIAGNOSIS — I10 Essential (primary) hypertension: Secondary | ICD-10-CM

## 2018-08-14 DIAGNOSIS — M19042 Primary osteoarthritis, left hand: Secondary | ICD-10-CM

## 2018-08-14 DIAGNOSIS — Z79899 Other long term (current) drug therapy: Secondary | ICD-10-CM

## 2018-08-14 DIAGNOSIS — E559 Vitamin D deficiency, unspecified: Secondary | ICD-10-CM

## 2018-08-14 DIAGNOSIS — R945 Abnormal results of liver function studies: Secondary | ICD-10-CM

## 2018-08-14 DIAGNOSIS — M19071 Primary osteoarthritis, right ankle and foot: Secondary | ICD-10-CM

## 2018-08-14 DIAGNOSIS — R7989 Other specified abnormal findings of blood chemistry: Secondary | ICD-10-CM

## 2018-08-14 DIAGNOSIS — M19041 Primary osteoarthritis, right hand: Secondary | ICD-10-CM

## 2018-08-14 DIAGNOSIS — M19072 Primary osteoarthritis, left ankle and foot: Secondary | ICD-10-CM

## 2018-08-14 DIAGNOSIS — D472 Monoclonal gammopathy: Secondary | ICD-10-CM

## 2018-08-14 DIAGNOSIS — M170 Bilateral primary osteoarthritis of knee: Secondary | ICD-10-CM

## 2018-08-15 LAB — COMPLETE METABOLIC PANEL WITHOUT GFR
AG Ratio: 1.3 (calc) (ref 1.0–2.5)
ALT: 40 U/L — ABNORMAL HIGH (ref 6–29)
AST: 50 U/L — ABNORMAL HIGH (ref 10–35)
Albumin: 4.3 g/dL (ref 3.6–5.1)
Alkaline phosphatase (APISO): 84 U/L (ref 33–130)
BUN: 13 mg/dL (ref 7–25)
CO2: 31 mmol/L (ref 20–32)
Calcium: 9.5 mg/dL (ref 8.6–10.4)
Chloride: 100 mmol/L (ref 98–110)
Creat: 0.66 mg/dL (ref 0.60–0.93)
GFR, Est African American: 98 mL/min/1.73m2
GFR, Est Non African American: 85 mL/min/1.73m2
Globulin: 3.2 g/dL (ref 1.9–3.7)
Glucose, Bld: 98 mg/dL (ref 65–99)
Potassium: 3.1 mmol/L — ABNORMAL LOW (ref 3.5–5.3)
Sodium: 140 mmol/L (ref 135–146)
Total Bilirubin: 0.7 mg/dL (ref 0.2–1.2)
Total Protein: 7.5 g/dL (ref 6.1–8.1)

## 2018-08-15 LAB — URINALYSIS, ROUTINE W REFLEX MICROSCOPIC
Bacteria, UA: NONE SEEN /HPF
Bilirubin Urine: NEGATIVE
Glucose, UA: NEGATIVE
Hgb urine dipstick: NEGATIVE
Hyaline Cast: NONE SEEN /LPF
Ketones, ur: NEGATIVE
Nitrite: NEGATIVE
Protein, ur: NEGATIVE
Specific Gravity, Urine: 1.012 (ref 1.001–1.03)
pH: 6.5 (ref 5.0–8.0)

## 2018-08-15 LAB — ANTI-DNA ANTIBODY, DOUBLE-STRANDED: ds DNA Ab: 2 [IU]/mL

## 2018-08-15 LAB — SEDIMENTATION RATE: Sed Rate: 9 mm/h (ref 0–30)

## 2018-08-15 LAB — C3 AND C4
C3 Complement: 192 mg/dL (ref 83–193)
C4 Complement: 42 mg/dL (ref 15–57)

## 2018-08-15 LAB — ANTI-SMITH ANTIBODY: ENA SM Ab Ser-aCnc: <1 AI

## 2018-08-15 NOTE — Progress Notes (Signed)
Complements WNL. Sed rate WNL. UA reveals findings consistent with a possible UTI.  Please advise patient to follow up with PCP.  Potassium is low-3.1. Please forward labs to PCP.  LFTs are trending down.  She is currently on Prednisone 10 mg 1 tablet by mouth daily.  Please forward labs to Dr. Benson Norway.

## 2018-08-15 NOTE — Progress Notes (Signed)
DsDNA is 2. No signs of a lupus flare.

## 2018-08-29 ENCOUNTER — Telehealth: Payer: Self-pay | Admitting: Rheumatology

## 2018-08-29 NOTE — Telephone Encounter (Signed)
Returned call to [facility] and sent lab results to her PCP's office Dr. [name].

## 2018-08-29 NOTE — Telephone Encounter (Signed)
Tammy from [facility] called requesting patient's lab results from 08/14/18. Please fax to [phone]   If you have any questions, please call back at [phone]

## 2018-09-07 ENCOUNTER — Telehealth: Payer: Self-pay | Admitting: Rheumatology

## 2018-09-07 NOTE — Telephone Encounter (Signed)
Tammy from [facility] called requesting patient's last office visit note and labs pertaining to her increased potassium faxed to their office.  Fax [phone]   Patient is in the office for an appointment now.  If you have any questions, please call [phone]

## 2018-09-07 NOTE — Telephone Encounter (Signed)
Received release of information. Faxed results as requested.

## 2018-10-08 ENCOUNTER — Inpatient Hospital Stay: Payer: Medicare HMO | Attending: Internal Medicine

## 2018-10-10 ENCOUNTER — Other Ambulatory Visit: Payer: Self-pay

## 2018-10-10 DIAGNOSIS — Z79899 Other long term (current) drug therapy: Secondary | ICD-10-CM

## 2018-10-11 LAB — CBC WITH DIFFERENTIAL/PLATELET
Absolute Monocytes: 1012 cells/uL — ABNORMAL HIGH (ref 200–950)
BASOS ABS: 62 {cells}/uL (ref 0–200)
Basophils Relative: 0.7 %
EOS ABS: 62 {cells}/uL (ref 15–500)
Eosinophils Relative: 0.7 %
HCT: 42.3 % (ref 35.0–45.0)
HEMOGLOBIN: 14.2 g/dL (ref 11.7–15.5)
Lymphs Abs: 2939 cells/uL (ref 850–3900)
MCH: 31.9 pg (ref 27.0–33.0)
MCHC: 33.6 g/dL (ref 32.0–36.0)
MCV: 95.1 fL (ref 80.0–100.0)
MONOS PCT: 11.5 %
MPV: 12 fL (ref 7.5–12.5)
NEUTROS ABS: 4726 {cells}/uL (ref 1500–7800)
Neutrophils Relative %: 53.7 %
PLATELETS: 181 10*3/uL (ref 140–400)
RBC: 4.45 10*6/uL (ref 3.80–5.10)
RDW: 12.7 % (ref 11.0–15.0)
Total Lymphocyte: 33.4 %
WBC: 8.8 10*3/uL (ref 3.8–10.8)

## 2018-10-11 LAB — COMPLETE METABOLIC PANEL WITH GFR
AG RATIO: 1.2 (calc) (ref 1.0–2.5)
ALBUMIN MSPROF: 3.9 g/dL (ref 3.6–5.1)
ALT: 19 U/L (ref 6–29)
AST: 19 U/L (ref 10–35)
Alkaline phosphatase (APISO): 97 U/L (ref 33–130)
BUN: 18 mg/dL (ref 7–25)
CALCIUM: 9.5 mg/dL (ref 8.6–10.4)
CO2: 32 mmol/L (ref 20–32)
Chloride: 99 mmol/L (ref 98–110)
Creat: 0.82 mg/dL (ref 0.60–0.93)
GFR, EST NON AFRICAN AMERICAN: 69 mL/min/{1.73_m2} (ref >=60)
GFR, Est African American: 79 mL/min/{1.73_m2} (ref >=60)
GLOBULIN: 3.3 g/dL (ref 1.9–3.7)
Glucose, Bld: 102 mg/dL — ABNORMAL HIGH (ref 65–99)
POTASSIUM: 3.5 mmol/L (ref 3.5–5.3)
Sodium: 140 mmol/L (ref 135–146)
TOTAL PROTEIN: 7.2 g/dL (ref 6.1–8.1)
Total Bilirubin: 0.7 mg/dL (ref 0.2–1.2)

## 2018-10-15 ENCOUNTER — Other Ambulatory Visit: Payer: Self-pay | Admitting: Hematology and Oncology

## 2018-10-15 ENCOUNTER — Inpatient Hospital Stay: Payer: Medicare HMO | Admitting: Hematology and Oncology

## 2018-10-15 DIAGNOSIS — D472 Monoclonal gammopathy: Secondary | ICD-10-CM

## 2018-10-15 NOTE — Assessment & Plan Note (Signed)
IgG lambda MGUS: Lab review: SPEP 04/16/2018: M protein: 0.3 g, beta-2 microglobulin 2.2, KL ratio 1.17 No evidence of anemia or renal dysfunction. There was no evidence of disease activity. Plan: Annual follow-ups with labs

## 2019-01-15 ENCOUNTER — Ambulatory Visit: Payer: Medicare HMO | Admitting: Physician Assistant

## 2019-02-12 NOTE — Progress Notes (Signed)
Office Visit Note  Patient: Deanna Hart             Date of Birth: [birth year]           MRN: [id number]             PCP: [name], MD Referring: [name], MD Visit Date: 02/25/2019 Occupation: @GUAROCC @  Subjective:  Left knee joint pain   History of Present Illness: Deanna Hart is a 78 y.o. female with history of systemic lupus erythematosus, elevated LFTs, and osteoarthritis.  She is currently taking prednisone 20 mg po daily. She denies any signs or symptoms of a lupus flare.  She has not followed up with Dr. Benson Norway recently, but she is planning to schedule an appointment soon to discuss her prednisone dose and restarting on Imuran.  She states she was evaluated by her PCP recently and was diagnosed with cellulitis of the left lower extremity and treated with clindamycin.  She states she finished the course of clindamycin on Thursday.  She reports 3 weeks ago she was having increased left knee joint pain.  She states the pain was severe and she was having difficulty walking.  She states the pain and swelling have improved.  She denies any left calf pain or left ankle joint pain.  She has pedal edema bilaterally.   She denies any recent rashes but continues have photosensitivity.  She says she tries to avoid the sunlight.  She denies any symptoms of Raynaud's.  She has noticed increased Hart loss since starting on high dose prednisone.  She denies any sores in her mouth or nose.  She has been experiencing increased mouth dryness recently but no eye dryness.  She has been sleeping well and denies any fatigue.  She has not had any recent fevers or swollen lymph nodes.  She denies any palpitations or shortness of breath.     Activities of Daily Living:  Patient reports morning stiffness for 0  minutes.   Patient Denies nocturnal pain.  Difficulty dressing/grooming: Denies Difficulty climbing stairs: Denies Difficulty getting out of chair: Denies Difficulty using  hands for taps, buttons, cutlery, and/or writing: Denies  Review of Systems  Constitutional: Negative for fatigue.  HENT: Positive for mouth dryness. Negative for mouth sores and nose dryness.   Eyes: Negative for pain, visual disturbance and dryness.  Respiratory: Negative for cough, hemoptysis, shortness of breath and difficulty breathing.   Cardiovascular: Negative for chest pain, palpitations, hypertension and swelling in legs/feet.  Gastrointestinal: Negative for blood in stool, constipation and diarrhea.  Endocrine: Negative for increased urination.  Genitourinary: Negative for painful urination.  Musculoskeletal: Positive for arthralgias, joint pain and joint swelling. Negative for myalgias, muscle weakness, morning stiffness, muscle tenderness and myalgias.  Skin: Negative for color change, pallor, rash, Hart loss, nodules/bumps, skin tightness, ulcers and sensitivity to sunlight.  Allergic/Immunologic: Negative for susceptible to infections.  Neurological: Negative for dizziness, numbness, headaches and weakness.  Hematological: Negative for swollen glands.  Psychiatric/Behavioral: Negative for depressed mood and sleep disturbance. The patient is not nervous/anxious.     PMFS History:  Patient Active Problem List   Diagnosis Date Noted  . MGUS (monoclonal gammopathy of unknown significance) 06/01/2017  . Hyperuricemia 06/01/2017  . Elevated LFTs 03/03/2017  . Acute pain of right shoulder 11/28/2016  . High risk medications (not anticoagulants) long-term use 07/19/2016  . Rheumatoid factor positive 07/19/2016  . SLE (systemic lupus erythematosus) ([place]) 07/18/2016  . Primary osteoarthritis of both hands 07/18/2016  .  Primary osteoarthritis of both feet 07/18/2016  . Calcaneal spur of both feet 07/18/2016  . Osteoarthritis of both knees 07/18/2016  . Hypertension 07/18/2016  . Obesity 07/18/2016  . Vitamin D deficiency 07/18/2016    Past Medical History:  Diagnosis Date   . Calcaneal spur of both feet 07/18/2016  . Hypertension 07/18/2016  . Obesity 07/18/2016  . Osteoarthritis of both feet 07/18/2016  . Osteoarthritis of both hands 07/18/2016  . 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   Marland Kitchen SLE (systemic lupus erythematosus) ([place]) 07/18/2016   Positive ANA, Positive Ro, Positive Smith, Positive RNP, Positive RF, Negative CCP,  Elevated LFTs  . Vitamin D deficiency 07/18/2016    Family History  Problem Relation Age of Onset  . Cancer Brother   . Cancer Maternal Uncle    Past Surgical History:  Procedure Laterality Date  . BREAST SURGERY    . EYE SURGERY Bilateral 07/2017   cataracts   Social History   Social History Narrative  . Not on file    There is no immunization history on file for this patient.   Objective: Vital Signs: BP (!) 154/90 (BP Location: Left Arm, Patient Position: Sitting, Cuff Size: Large)   Pulse 96   Resp 15   Ht 5\' 4"  (1.626 m)   Wt 232 lb (105.2 kg)   BMI 39.82 kg/m    Physical Exam Vitals signs and nursing note reviewed.  Constitutional:      Appearance: She is well-developed.  HENT:     Head: Normocephalic and atraumatic.  Eyes:     Conjunctiva/sclera: Conjunctivae normal.  Neck:     Musculoskeletal: Normal range of motion.  Cardiovascular:     Rate and Rhythm: Normal rate and regular rhythm.     Heart sounds: Normal heart sounds.  Pulmonary:     Effort: Pulmonary effort is normal.     Breath sounds: Normal breath sounds.  Abdominal:     General: Bowel sounds are normal.     Palpations: Abdomen is soft.  Lymphadenopathy:     Cervical: No cervical adenopathy.  Skin:    General: Skin is warm and dry.     Capillary Refill: Capillary refill takes less than 2 seconds.  Neurological:     Mental Status: She is alert and oriented to person, place, and time.  Psychiatric:        Behavior: Behavior normal.      Musculoskeletal Exam: C-spine, thoracic spine, lumbar  spine good range of motion.  No midline spinal tenderness.  No SI joint tenderness.  Shoulder joints, with joints, wrist joints, MCPs, PIPs, DIPs good range of motion no synovitis.  She is complete fist formation bilaterally.  Hip joints have good range of motion with no discomfort.  Knee joints have good range of motion.  She has bilateral knee crepitus.  Mild warmth of the left knee joint noted.  She has pedal edema bilaterally.  No tenderness of ankle joints.  No calf tenderness.  CDAI Exam: CDAI Score: Not documented Patient Global Assessment: Not documented; Provider Global Assessment: Not documented Swollen: Not documented; Tender: Not documented Joint Exam   Not documented   There is currently no information documented on the homunculus. Go to the Rheumatology activity and complete the homunculus joint exam.  Investigation: No additional findings.  Imaging: No results found.  Recent Labs: Lab Results  Component Value Date   WBC 8.8 10/10/2018   HGB 14.2 10/10/2018   PLT  181 10/10/2018   NA 140 10/10/2018   K 3.5 10/10/2018   CL 99 10/10/2018   CO2 32 10/10/2018   GLUCOSE 102 (H) 10/10/2018   BUN 18 10/10/2018   CREATININE 0.82 10/10/2018   BILITOT 0.7 10/10/2018   ALKPHOS 64 07/02/2018   AST 19 10/10/2018   ALT 19 10/10/2018   PROT 7.2 10/10/2018   ALBUMIN 3.9 07/02/2018   CALCIUM 9.5 10/10/2018   GFRAA 79 10/10/2018    Speciality Comments: No specialty comments available.  Procedures:  No procedures performed Allergies: Plaquenil [hydroxychloroquine sulfate]   Assessment / Plan:     Visit Diagnoses: Other systemic lupus erythematosus with other organ involvement ([place]) - +ANA +Ro +Smith +RNP + RF neg CCP / h/o skin biopsy + by Dr Ronnald Ramp: She has not had any recent signs or symptoms of a lupus flare.  She is currently taking prednisone 20 mg by mouth daily for elevated LFTs, and she follows up with Dr. Benson Norway on a regular basis.  She has chronic left knee joint pain  and warmth.  She has good range of motion with no discomfort.  She has bilateral knee crepitus.  She has no other joint pain or joint swelling at this time.  She has not had any recent rashes.  She continues to photosensitivity and was encourage to wear sunscreen on a daily basis and avoid the prime hours of sunlight.  She denies any symptoms of Raynaud's and no digital ulcerations or signs of gangrene were noted.  She has no sores in her mouth or nose.  She has been experiencing increased mouth dryness but no eye dryness.  I discussed using OTC Biotene products to help with mouth dryness.  She has not had any shortness of breath or palpitations recently.  She will continue on prednisone 20 mg by mouth daily.  She plans on scheduling an appointment with Dr. Benson Norway to discuss starting to taper down her prednisone dose.  She would like to discuss restarting on Imuran.  She was advised to notify us if she develops any new or worsening symptoms.  We will check autoimmune lab work today.  She will follow-up in the office in 5 months.- Plan: CBC with Differential/Platelet, COMPLETE METABOLIC PANEL WITH GFR, Urinalysis, Routine w reflex microscopic, Anti-DNA antibody, double-stranded, C3 and C4, Sedimentation rate, VITAMIN D 25 Hydroxy (Vit-D Deficiency, Fractures)  High risk medication use -She is currently taking prednisone 20 mg po daily. She could not tolerate Imuran or Plaquenil due to GI SE. she will be following up with Dr. Benson Norway to discuss retrying Imuran.  She would like to start tapering her prednisone dose.  CBC and CMP will be drawn today.  Lab work will be forwarded to Dr. Benson Norway.- Plan: CBC with Differential/Platelet, COMPLETE METABOLIC PANEL WITH GFR  Elevated LFTs - She is followed by Dr. Benson Norway.  She is currently taking prednisone 20 mg po daily. She took low-dose Imuran for 2 weeks in July 2019 but had to discontinue due to experiencing nausea and vomiting (d/c 04/11/18).  She is planning on scheduling an  appointment with Dr. Benson Norway to discuss restarting on Imuran. CBC and CMP were obtained today. - Plan: COMPLETE METABOLIC PANEL WITH GFR  Primary osteoarthritis of both hands: She has no tenderness or synovitis.  She has complete fist formation bilaterally.  Joint protection and muscle strengthening were discussed.  She was advised to notify us if she develops increased joint pain or joint swelling.  Primary osteoarthritis of both knees:  She has bilateral knee joint crepitus.  Mild warmth of the left knee joint noted.  She has good ROM of both knee joints.  She reports 3 weeks ago she was having severe left knee joint pain and having difficulty walking.  Her left knee joint pain has improved.  She has no calf tenderness or tightness.  She has no ankle joint tenderness.  She has pedal edema bilaterally. She continues to take prednisone 20 mg po daily.   Primary osteoarthritis of both feet: She has no discomfort at this time.  She has pedal edema bilaterally.    MGUS (monoclonal gammopathy of unknown significance): She follows up with hematology.   Vitamin D deficiency: Vitamin D level will be checked today.  Essential hypertension - She is on Norvasc 5 mg 1 tablet by mouth daily.    Orders: Orders Placed This Encounter  Procedures  . CBC with Differential/Platelet  . COMPLETE METABOLIC PANEL WITH GFR  . Urinalysis, Routine w reflex microscopic  . Anti-DNA antibody, double-stranded  . C3 and C4  . Sedimentation rate  . VITAMIN D 25 Hydroxy (Vit-D Deficiency, Fractures)   No orders of the defined types were placed in this encounter.     Follow-Up Instructions: Return in about 5 months (around 07/28/2019) for Systemic lupus erythematosus, Osteoarthritis.   [name], PA-C  Note - This record has been created using Dragon software.  Chart creation errors have been sought, but may not always  have been located. Such creation errors do not reflect on  the standard of medical care.

## 2019-02-25 ENCOUNTER — Ambulatory Visit (INDEPENDENT_AMBULATORY_CARE_PROVIDER_SITE_OTHER): Payer: Medicare HMO | Admitting: Physician Assistant

## 2019-02-25 ENCOUNTER — Other Ambulatory Visit: Payer: Self-pay

## 2019-02-25 ENCOUNTER — Encounter: Payer: Self-pay | Admitting: Physician Assistant

## 2019-02-25 VITALS — BP 154/90 | HR 96 | Resp 15 | Ht 64.0 in | Wt 232.0 lb

## 2019-02-25 DIAGNOSIS — M19042 Primary osteoarthritis, left hand: Secondary | ICD-10-CM

## 2019-02-25 DIAGNOSIS — M3219 Other organ or system involvement in systemic lupus erythematosus: Secondary | ICD-10-CM

## 2019-02-25 DIAGNOSIS — Z79899 Other long term (current) drug therapy: Secondary | ICD-10-CM | POA: Diagnosis not present

## 2019-02-25 DIAGNOSIS — E559 Vitamin D deficiency, unspecified: Secondary | ICD-10-CM

## 2019-02-25 DIAGNOSIS — I10 Essential (primary) hypertension: Secondary | ICD-10-CM

## 2019-02-25 DIAGNOSIS — M19071 Primary osteoarthritis, right ankle and foot: Secondary | ICD-10-CM

## 2019-02-25 DIAGNOSIS — M19041 Primary osteoarthritis, right hand: Secondary | ICD-10-CM

## 2019-02-25 DIAGNOSIS — D472 Monoclonal gammopathy: Secondary | ICD-10-CM

## 2019-02-25 DIAGNOSIS — R7989 Other specified abnormal findings of blood chemistry: Secondary | ICD-10-CM

## 2019-02-25 DIAGNOSIS — M19072 Primary osteoarthritis, left ankle and foot: Secondary | ICD-10-CM

## 2019-02-25 DIAGNOSIS — R945 Abnormal results of liver function studies: Secondary | ICD-10-CM | POA: Diagnosis not present

## 2019-02-25 DIAGNOSIS — M170 Bilateral primary osteoarthritis of knee: Secondary | ICD-10-CM

## 2019-02-25 NOTE — Patient Instructions (Signed)
Biotene products for mouth dryness

## 2019-02-26 LAB — URINALYSIS, ROUTINE W REFLEX MICROSCOPIC
Bilirubin Urine: NEGATIVE
Hgb urine dipstick: NEGATIVE
Ketones, ur: NEGATIVE
Leukocytes,Ua: NEGATIVE
Nitrite: NEGATIVE
Protein, ur: NEGATIVE
Specific Gravity, Urine: 1.014 (ref 1.001–1.03)
pH: 5.5 (ref 5.0–8.0)

## 2019-02-26 LAB — COMPLETE METABOLIC PANEL WITH GFR
AG Ratio: 1.4 (calc) (ref 1.0–2.5)
ALT: 29 U/L (ref 6–29)
AST: 41 U/L — ABNORMAL HIGH (ref 10–35)
Albumin: 4.2 g/dL (ref 3.6–5.1)
Alkaline phosphatase (APISO): 82 U/L (ref 37–153)
BUN: 16 mg/dL (ref 7–25)
CO2: 30 mmol/L (ref 20–32)
Calcium: 9.4 mg/dL (ref 8.6–10.4)
Chloride: 100 mmol/L (ref 98–110)
Creat: 0.75 mg/dL (ref 0.60–0.93)
GFR, Est African American: 88 mL/min/{1.73_m2} (ref >=60)
GFR, Est Non African American: 76 mL/min/{1.73_m2} (ref >=60)
Globulin: 3.1 g/dL (calc) (ref 1.9–3.7)
Glucose, Bld: 84 mg/dL (ref 65–99)
Potassium: 3.7 mmol/L (ref 3.5–5.3)
Sodium: 138 mmol/L (ref 135–146)
Total Bilirubin: 0.6 mg/dL (ref 0.2–1.2)
Total Protein: 7.3 g/dL (ref 6.1–8.1)

## 2019-02-26 LAB — CBC WITH DIFFERENTIAL/PLATELET
Absolute Monocytes: 706 cells/uL (ref 200–950)
Basophils Absolute: 42 cells/uL (ref 0–200)
Basophils Relative: 0.5 %
Eosinophils Absolute: 8 cells/uL — ABNORMAL LOW (ref 15–500)
Eosinophils Relative: 0.1 %
HCT: 42.4 % (ref 35.0–45.0)
Hemoglobin: 14.3 g/dL (ref 11.7–15.5)
Lymphs Abs: 2545 cells/uL (ref 850–3900)
MCH: 31.3 pg (ref 27.0–33.0)
MCHC: 33.7 g/dL (ref 32.0–36.0)
MCV: 92.8 fL (ref 80.0–100.0)
MPV: 11.2 fL (ref 7.5–12.5)
Monocytes Relative: 8.4 %
Neutro Abs: 5099 cells/uL (ref 1500–7800)
Neutrophils Relative %: 60.7 %
Platelets: 195 10*3/uL (ref 140–400)
RBC: 4.57 10*6/uL (ref 3.80–5.10)
RDW: 12.9 % (ref 11.0–15.0)
Total Lymphocyte: 30.3 %
WBC: 8.4 10*3/uL (ref 3.8–10.8)

## 2019-02-26 LAB — C3 AND C4
C3 Complement: 191 mg/dL (ref 83–193)
C4 Complement: 38 mg/dL (ref 15–57)

## 2019-02-26 LAB — ANTI-DNA ANTIBODY, DOUBLE-STRANDED: ds DNA Ab: 3 IU/mL

## 2019-02-26 LAB — VITAMIN D 25 HYDROXY (VIT D DEFICIENCY, FRACTURES): Vit D, 25-Hydroxy: 28 ng/mL — ABNORMAL LOW (ref 30–100)

## 2019-02-26 LAB — SEDIMENTATION RATE: Sed Rate: 9 mm/h (ref 0–30)

## 2019-02-26 NOTE — Progress Notes (Signed)
DsDNA is 3.   Vitamin D is low.  Please send in vitamin D 50,000 units by mouth once weekly x3 months.  Recheck vitamin D in 3 months.

## 2019-02-26 NOTE — Progress Notes (Signed)
AST is mildly elevated-41. ALT WNL. Rest of CMP WNL. UA revealed trace glucoes.  Sed rate and complements WNL.   Please forward labs to Dr. Benson Norway.

## 2019-03-04 ENCOUNTER — Telehealth: Payer: Self-pay | Admitting: Rheumatology

## 2019-03-04 NOTE — Telephone Encounter (Signed)
Attempted to contact patient and left message on machine for patient to call the office.  

## 2019-03-04 NOTE — Telephone Encounter (Signed)
Patient left a voicemail stating she was returning a call to the office.  Patient did state who called or what it was regarding.

## 2019-03-13 ENCOUNTER — Encounter: Payer: Self-pay | Admitting: *Deleted

## 2019-03-13 ENCOUNTER — Telehealth: Payer: Self-pay | Admitting: *Deleted

## 2019-03-13 DIAGNOSIS — E559 Vitamin D deficiency, unspecified: Secondary | ICD-10-CM

## 2019-03-13 MED ORDER — VITAMIN D (ERGOCALCIFEROL) 1.25 MG (50000 UNIT) PO CAPS: 50000.0000 [IU] | ORAL_CAPSULE | ORAL | 0 refills | Status: DC

## 2019-03-13 NOTE — Telephone Encounter (Signed)
-----   Message from [name], PA-C sent at 02/26/2019 12:58 PM EDT ----- DsDNA is 3.   Vitamin D is low.  Please send in vitamin D 50,000 units by mouth once weekly x3 months.  Recheck vitamin D in 3 months.

## 2019-03-18 ENCOUNTER — Telehealth: Payer: Self-pay | Admitting: *Deleted

## 2019-03-18 NOTE — Telephone Encounter (Signed)
Patient left message regarding a labs. Attempted to contact patient and left message for patient to call the office.

## 2019-05-23 ENCOUNTER — Telehealth: Payer: Self-pay | Admitting: Rheumatology

## 2019-05-23 NOTE — Telephone Encounter (Signed)
Patient left a voicemail stating she has not been able to get her PCP to send in her prescription of Amlodipine and hasn't had a pill in 3 days.  Patient states she has tried to contact their office for the last 3 days.  Patient states Dr. Estanislado Pandy has prescribed the medication in the past and is requesting they send the prescription to [facility] at [address].

## 2019-05-23 NOTE — Telephone Encounter (Signed)
Attempted to contact patient and left message for patient to call the office.  

## 2019-07-03 ENCOUNTER — Other Ambulatory Visit: Payer: Self-pay | Admitting: Internal Medicine

## 2019-07-03 DIAGNOSIS — Z1231 Encounter for screening mammogram for malignant neoplasm of breast: Secondary | ICD-10-CM

## 2019-07-16 NOTE — Progress Notes (Signed)
Office Visit Note  Patient: Deanna Hart             Date of Birth: Aug 19, 1941           MRN: [id number]             PCP: [name], MD Referring: [name], MD Visit Date: 07/29/2019 Occupation: '@GUAROCC'$ @  Subjective:  Fatigue   History of Present Illness: Deanna Hart is a 78 y.o. female with history of systemic lupus erythematosus, osteoarthritis, and autoimmune hepatitis.  Patient is taking prednisone 20 mg by mouth daily.  She continues to follow-up with Dr. Benson Norway.  According to the patient she was restarted on Imuran but discontinued after 1 dose due to experiencing nausea, vomiting, and diarrhea.  She has not followed up with Dr. Benson Norway since then.  She has been having some abdominal pain since that episode about 1 month ago. She denies any signs or symptoms of a lupus flare.  She has been having some worsening fatigue but denies any fevers or enlarged lymph nodes.  She denies any recent rashes, photosensitivity, symptoms of Raynaud's.  She denies any sores in her mouth or sicca symptoms.  She denies any shortness of breath, chest pain, or palpitations.  She denies any joint pain but continues to have some warmth in the left knee joint.     Activities of Daily Living:  Patient reports morning stiffness for 5 minutes.   Patient Denies nocturnal pain.  Difficulty dressing/grooming: Denies Difficulty climbing stairs: Reports Difficulty getting out of chair: Reports Difficulty using hands for taps, buttons, cutlery, and/or writing: Denies  Review of Systems  Constitutional: Positive for fatigue.  HENT: Negative for mouth sores, mouth dryness and nose dryness.   Eyes: Negative for itching and dryness.  Respiratory: Negative for shortness of breath and difficulty breathing.   Cardiovascular: Negative for chest pain and palpitations.  Gastrointestinal: Negative for abdominal pain, blood in stool, constipation and diarrhea.  Endocrine: Negative for increased  urination.  Genitourinary: Negative for difficulty urinating and painful urination.  Musculoskeletal: Positive for joint swelling and morning stiffness. Negative for arthralgias and joint pain.  Skin: Negative for rash.  Allergic/Immunologic: Negative for susceptible to infections.  Neurological: Negative for dizziness, numbness, headaches, memory loss and weakness.  Hematological: Negative for bruising/bleeding tendency.  Psychiatric/Behavioral: Negative for confusion. The patient is not nervous/anxious.     PMFS History:  Patient Active Problem List   Diagnosis Date Noted  . MGUS (monoclonal gammopathy of unknown significance) 06/01/2017  . Hyperuricemia 06/01/2017  . Elevated LFTs 03/03/2017  . Acute pain of right shoulder 11/28/2016  . High risk medications (not anticoagulants) long-term use 07/19/2016  . Rheumatoid factor positive 07/19/2016  . SLE (systemic lupus erythematosus) ([place]) 07/18/2016  . Primary osteoarthritis of both hands 07/18/2016  . Primary osteoarthritis of both feet 07/18/2016  . Calcaneal spur of both feet 07/18/2016  . Osteoarthritis of both knees 07/18/2016  . Hypertension 07/18/2016  . Obesity 07/18/2016  . Vitamin D deficiency 07/18/2016    Past Medical History:  Diagnosis Date  . Calcaneal spur of both feet 07/18/2016  . Hypertension 07/18/2016  . Obesity 07/18/2016  . Osteoarthritis of both feet 07/18/2016  . Osteoarthritis of both hands 07/18/2016  . 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   Marland Kitchen SLE (systemic lupus erythematosus) ([place]) 07/18/2016   Positive ANA, Positive Ro, Positive Smith, Positive RNP, Positive RF, Negative CCP,  Elevated LFTs  . Vitamin D  deficiency 07/18/2016    Family History  Problem Relation Age of Onset  . Cancer Brother   . Cancer Maternal Uncle    Past Surgical History:  Procedure Laterality Date  . BREAST SURGERY    . EYE SURGERY Bilateral 07/2017   cataracts   Social History    Social History Narrative  . Not on file    There is no immunization history on file for this patient.   Objective: Vital Signs: BP (!) 162/93 (BP Location: Left Wrist, Patient Position: Sitting, Cuff Size: Normal)   Pulse 87   Resp 15   Ht _0  (1.626 m)   Wt 243 lb 12.8 oz (110.6 kg)   BMI 41.85 kg/m    Physical Exam Vitals signs and nursing note reviewed.  Constitutional:      Appearance: She is well-developed.  HENT:     Head: Normocephalic and atraumatic.  Eyes:     Conjunctiva/sclera: Conjunctivae normal.  Neck:     Musculoskeletal: Normal range of motion.  Cardiovascular:     Rate and Rhythm: Normal rate and regular rhythm.     Heart sounds: Normal heart sounds.  Pulmonary:     Effort: Pulmonary effort is normal.     Breath sounds: Normal breath sounds.  Abdominal:     General: Bowel sounds are normal.     Palpations: Abdomen is soft.     Hernia: A hernia (Umbilical) is present.  Lymphadenopathy:     Cervical: No cervical adenopathy.  Skin:    General: Skin is warm and dry.     Capillary Refill: Capillary refill takes less than 2 seconds.  Neurological:     Mental Status: She is alert and oriented to person, place, and time.  Psychiatric:        Behavior: Behavior normal.      Musculoskeletal Exam: C-spine, thoracic spine, and lumbar spine good ROM.  Shoulder joints, elbow joints, wrist joints, MCPs, PIPs, and DIPs good ROM with no synovitis. PIP and DIP synovial thickening consistent with osteoarthritis of both hands.  Complete fist formation bilaterally.  Hip joints good ROM.  Knee joints good ROM.  Warmth of the left knee joint noted.  Ankle joints, MTPs, PIPs, and DIPs good ROM with no synovitis.  No tenderness of ankle joints. Pedal edema bilaterally.   CDAI Exam: CDAI Score: - Patient Global: -; Provider Global: - Swollen: -; Tender: - Joint Exam   No joint exam has been documented for this visit   There is currently no information documented  on the homunculus. Go to the Rheumatology activity and complete the homunculus joint exam.  Investigation: No additional findings.  Imaging: No results found.  Recent Labs: Lab Results  Component Value Date   WBC 8.4 02/25/2019   HGB 14.3 02/25/2019   PLT 195 02/25/2019   NA 138 02/25/2019   K 3.7 02/25/2019   CL 100 02/25/2019   CO2 30 02/25/2019   GLUCOSE 84 02/25/2019   BUN 16 02/25/2019   CREATININE 0.75 02/25/2019   BILITOT 0.6 02/25/2019   ALKPHOS 64 07/02/2018   AST 41 (H) 02/25/2019   ALT 29 02/25/2019   PROT 7.3 02/25/2019   ALBUMIN 3.9 07/02/2018   CALCIUM 9.4 02/25/2019   GFRAA 88 02/25/2019    Speciality Comments: No specialty comments available.  Procedures:  No procedures performed Allergies: Plaquenil [hydroxychloroquine sulfate]   Assessment / Plan:     Visit Diagnoses: Other systemic lupus erythematosus with other organ involvement ([place]) - +  ANA +Ro +Smith +RNP + RF neg CCP / h/o skin biopsy + by Dr Ronnald Ramp: She is not experiencing any signs or symptoms of a lupus flare.  She is currently on prednisone 20 mg by mouth daily.  She is not taking any immunosuppressive agents at this time.  She cannot tolerate taking Plaquenil or Imuran in the past due to GI side effects.  Double-stranded DNA was negative, complements within normal limits, and sed rate within normal limits on 02/25/2019.  She is not having any joint pain at this time.  She has chronic warmth and inflammation of the left knee but has no discomfort at this time.  She has not had any recent rashes, photosensitivity, or symptoms of Raynaud's recently.  No digital ulcerations or signs of gangrene were noted.  She has not had any oral or nasal ulcerations.  She has no sicca symptoms.  She is been having increased fatigue but has not had any fevers or enlarged lymph nodes recently.  We will check autoimmune lab work today.  She is advised to notify us if she develops any new or worsening symptoms.  She will  continue taking prednisone 20 mg by mouth daily.  If her disease becomes more active CellCept may be a treatment option in the future.- Plan: predniSONE (DELTASONE) 10 MG tablet, UA w reflex microscopic, C3 and C4, dsDNA, ESR, VITAMIN D 25, CMP, CBC  High risk medication use - Prednisone 20 mg po daily. She could not tolerate Imuran or Plaquenil due to GI SE. CBC and CMP will be drawn today to monitor for drug toxicity.- Plan: CMP, CBC  Autoimmune hepatitis ([place]) -She is followed by Dr. Benson Norway. She is currently taking prednisone 20 mg po daily.  She was restarted on Imuran 1 month ago but could not tolerate it due to nausea, vomiting, and diarrhea.  She was advised to follow with Dr. Benson Norway to discuss further treatment options.  - Plan: predniSONE (DELTASONE) 10 MG tablet  Primary osteoarthritis of both hands: She has PIP and DIP synovial thickening consistent with osteoarthritis of both hands.  She has complete fist formation bilaterally.  Joint protection and muscle strengthening were discussed.   Primary osteoarthritis of both knees: She has good ROM of both knee joints.  Valgus deformity of left knee.  Warmth of the left knee joint noted but no effusion.    Primary osteoarthritis of both feet:  She has no feet pain at this time.   Vitamin D deficiency - Vitamin D level will be checked today.  Plan: VITAMIN D 25  Other medical conditions are listed as follows:   MGUS (monoclonal gammopathy of unknown significance)  Essential hypertension  Hyperuricemia  Calcaneal spur of both feet  Orders: Orders Placed This Encounter  Procedures  . UA w reflex microscopic  . C3 and C4  . dsDNA  . ESR  . VITAMIN D 25  . CMP  . CBC   Meds ordered this encounter  Medications  . predniSONE (DELTASONE) 10 MG tablet    Sig: Take 2 tablets (20 mg total) by mouth at breakfast daily.    Dispense:  60 tablet    Refill:  0      Follow-Up Instructions: Return in about 5 months (around 12/27/2019) for  Systemic lupus erythematosus, Autoimmune hepatitis .   [name], PA-C   I examined and evaluated the patient with Hazel Sams PA.  Patient with systemic lupus and autoimmune hepatitis.  She has tried Plaquenil but  she did not tolerate the side effects.  She was given Imuran by Dr. Benson Norway which she discontinued due to GI side effects.  At this point she has been on prednisone 20 mg p.o. daily.  She has an appointment coming up with Dr. Benson Norway.  Have advised her to discuss treatment options with Dr. Benson Norway.  CellCept could be elevated option which she may discuss with Dr. Benson Norway.  The plan of care was discussed as noted above.  Bo Merino, MD  Note - This record has been created using Editor, commissioning.  Chart creation errors have been sought, but may not always  have been located. Such creation errors do not reflect on  the standard of medical care.

## 2019-07-29 ENCOUNTER — Ambulatory Visit (INDEPENDENT_AMBULATORY_CARE_PROVIDER_SITE_OTHER): Payer: Medicare HMO | Admitting: Rheumatology

## 2019-07-29 ENCOUNTER — Other Ambulatory Visit: Payer: Self-pay

## 2019-07-29 ENCOUNTER — Encounter: Payer: Self-pay | Admitting: Physician Assistant

## 2019-07-29 VITALS — BP 162/93 | HR 87 | Resp 15 | Ht 64.0 in | Wt 243.8 lb

## 2019-07-29 DIAGNOSIS — K754 Autoimmune hepatitis: Secondary | ICD-10-CM

## 2019-07-29 DIAGNOSIS — E790 Hyperuricemia without signs of inflammatory arthritis and tophaceous disease: Secondary | ICD-10-CM

## 2019-07-29 DIAGNOSIS — M19041 Primary osteoarthritis, right hand: Secondary | ICD-10-CM | POA: Diagnosis not present

## 2019-07-29 DIAGNOSIS — M170 Bilateral primary osteoarthritis of knee: Secondary | ICD-10-CM

## 2019-07-29 DIAGNOSIS — M19042 Primary osteoarthritis, left hand: Secondary | ICD-10-CM

## 2019-07-29 DIAGNOSIS — I10 Essential (primary) hypertension: Secondary | ICD-10-CM

## 2019-07-29 DIAGNOSIS — M3219 Other organ or system involvement in systemic lupus erythematosus: Secondary | ICD-10-CM

## 2019-07-29 DIAGNOSIS — D472 Monoclonal gammopathy: Secondary | ICD-10-CM

## 2019-07-29 DIAGNOSIS — M7732 Calcaneal spur, left foot: Secondary | ICD-10-CM

## 2019-07-29 DIAGNOSIS — M19072 Primary osteoarthritis, left ankle and foot: Secondary | ICD-10-CM

## 2019-07-29 DIAGNOSIS — M19071 Primary osteoarthritis, right ankle and foot: Secondary | ICD-10-CM

## 2019-07-29 DIAGNOSIS — E559 Vitamin D deficiency, unspecified: Secondary | ICD-10-CM

## 2019-07-29 DIAGNOSIS — M7731 Calcaneal spur, right foot: Secondary | ICD-10-CM

## 2019-07-29 DIAGNOSIS — Z79899 Other long term (current) drug therapy: Secondary | ICD-10-CM

## 2019-07-29 MED ORDER — PREDNISONE 10 MG PO TABS: ORAL_TABLET | ORAL | 0 refills | Status: AC

## 2019-07-30 LAB — CBC WITH DIFFERENTIAL/PLATELET
Absolute Monocytes: 809 cells/uL (ref 200–950)
Basophils Absolute: 42 cells/uL (ref 0–200)
Basophils Relative: 0.4 %
Eosinophils Absolute: 0 cells/uL — ABNORMAL LOW (ref 15–500)
Eosinophils Relative: 0 %
HCT: 43.1 % (ref 35.0–45.0)
Hemoglobin: 14.4 g/dL (ref 11.7–15.5)
Lymphs Abs: 1712 cells/uL (ref 850–3900)
MCH: 30.8 pg (ref 27.0–33.0)
MCHC: 33.4 g/dL (ref 32.0–36.0)
MCV: 92.3 fL (ref 80.0–100.0)
MPV: 11.9 fL (ref 7.5–12.5)
Monocytes Relative: 7.7 %
Neutro Abs: 7938 cells/uL — ABNORMAL HIGH (ref 1500–7800)
Neutrophils Relative %: 75.6 %
Platelets: 192 10*3/uL (ref 140–400)
RBC: 4.67 10*6/uL (ref 3.80–5.10)
RDW: 13.5 % (ref 11.0–15.0)
Total Lymphocyte: 16.3 %
WBC: 10.5 10*3/uL (ref 3.8–10.8)

## 2019-07-30 LAB — URINALYSIS, ROUTINE W REFLEX MICROSCOPIC
Bilirubin Urine: NEGATIVE
Glucose, UA: NEGATIVE
Hgb urine dipstick: NEGATIVE
Hyaline Cast: NONE SEEN /LPF
Ketones, ur: NEGATIVE
Nitrite: NEGATIVE
Protein, ur: NEGATIVE
RBC / HPF: NONE SEEN /HPF (ref 0–2)
Specific Gravity, Urine: 1.007 (ref 1.001–1.03)
pH: 7 (ref 5.0–8.0)

## 2019-07-30 LAB — ANTI-DNA ANTIBODY, DOUBLE-STRANDED: ds DNA Ab: 2 IU/mL

## 2019-07-30 LAB — COMPLETE METABOLIC PANEL WITH GFR
AG Ratio: 1.2 (calc) (ref 1.0–2.5)
ALT: 34 U/L — ABNORMAL HIGH (ref 6–29)
AST: 37 U/L — ABNORMAL HIGH (ref 10–35)
Albumin: 4.1 g/dL (ref 3.6–5.1)
Alkaline phosphatase (APISO): 78 U/L (ref 37–153)
BUN: 14 mg/dL (ref 7–25)
CO2: 31 mmol/L (ref 20–32)
Calcium: 9.9 mg/dL (ref 8.6–10.4)
Chloride: 98 mmol/L (ref 98–110)
Creat: 0.8 mg/dL (ref 0.60–0.93)
GFR, Est African American: 81 mL/min/{1.73_m2} (ref >=60)
GFR, Est Non African American: 70 mL/min/{1.73_m2} (ref >=60)
Globulin: 3.3 g/dL (calc) (ref 1.9–3.7)
Glucose, Bld: 140 mg/dL — ABNORMAL HIGH (ref 65–99)
Potassium: 3.5 mmol/L (ref 3.5–5.3)
Sodium: 140 mmol/L (ref 135–146)
Total Bilirubin: 0.8 mg/dL (ref 0.2–1.2)
Total Protein: 7.4 g/dL (ref 6.1–8.1)

## 2019-07-30 LAB — SEDIMENTATION RATE: Sed Rate: 9 mm/h (ref 0–30)

## 2019-07-30 LAB — VITAMIN D 25 HYDROXY (VIT D DEFICIENCY, FRACTURES): Vit D, 25-Hydroxy: 34 ng/mL (ref 30–100)

## 2019-07-30 LAB — C3 AND C4
C3 Complement: 182 mg/dL (ref 83–193)
C4 Complement: 41 mg/dL (ref 15–57)

## 2019-07-30 NOTE — Progress Notes (Signed)
UA revealed 2+ leukocytes.  Please ask if patient she is having symptoms of a UTI.  Please advise patient to follow up with PCP for further evaluation.  Please forward results to PCP.   Complements WNL.  ESR WNL.  Vitamin D is 34-Please advise patient to continue taking a maintenance dose of vitamin D.   CBC stable.  Glucose is elevated-140.  AST 37 and ALT 34-trending down.  She is taking prednisone 20 mg po daily.  Please forward labs to Dr. Benson Norway.

## 2019-07-30 NOTE — Progress Notes (Signed)
DsDNA is negative. Labs do not reveal findings of a flare.

## 2019-08-01 ENCOUNTER — Telehealth: Payer: Self-pay | Admitting: Rheumatology

## 2019-08-01 NOTE — Telephone Encounter (Signed)
Noted  

## 2019-08-01 NOTE — Telephone Encounter (Signed)
Deanna Hart from Dr. Roland Earl office left a voicemail to let you know that Deanna Hart is not a patient of Dr. Roland Earl.

## 2019-08-20 ENCOUNTER — Other Ambulatory Visit: Payer: Self-pay | Admitting: Internal Medicine

## 2019-08-20 ENCOUNTER — Other Ambulatory Visit: Payer: Self-pay

## 2019-08-20 ENCOUNTER — Ambulatory Visit
Admission: RE | Admit: 2019-08-20 | Discharge: 2019-08-20 | Disposition: A | Discharge status: Home / Self Care | Payer: Medicare HMO | Source: Ambulatory Visit | Attending: Internal Medicine | Admitting: Internal Medicine

## 2019-08-20 DIAGNOSIS — Z1231 Encounter for screening mammogram for malignant neoplasm of breast: Secondary | ICD-10-CM

## 2019-09-05 ENCOUNTER — Telehealth: Payer: Self-pay | Admitting: Rheumatology

## 2019-09-05 NOTE — Telephone Encounter (Signed)
Patient calling to ask for someone to give her a call in reference to her stomach issues. Patient has gone to several doctors, without any relief. Per patient, she does not have any pain or soreness. She just feels something moving around at her belly button while she's sitting, or coughs. Please call to advise.

## 2019-09-05 NOTE — Telephone Encounter (Signed)
Attempted to contact patient and left message on machine to advise patient to call the office.  

## 2019-09-05 NOTE — Telephone Encounter (Signed)
At her office visit on 07/29/19 we discussed that she most likely has an umbilical hernia, which is contributing to her symptoms.  Please refer patient to a general surgeon for further evaluation.

## 2019-09-23 ENCOUNTER — Ambulatory Visit
Admission: RE | Admit: 2019-09-23 | Discharge: 2019-09-23 | Disposition: A | Discharge status: Home / Self Care | Payer: Medicare HMO | Source: Ambulatory Visit | Attending: Nurse Practitioner | Admitting: Nurse Practitioner

## 2019-09-23 ENCOUNTER — Other Ambulatory Visit: Payer: Self-pay | Admitting: Nurse Practitioner

## 2019-09-23 DIAGNOSIS — R109 Unspecified abdominal pain: Secondary | ICD-10-CM

## 2019-12-19 ENCOUNTER — Ambulatory Visit: Payer: Medicare HMO | Attending: Internal Medicine

## 2019-12-19 DIAGNOSIS — Z23 Encounter for immunization: Secondary | ICD-10-CM

## 2019-12-19 NOTE — Progress Notes (Signed)
   Covid-19 Vaccination Clinic  Name:  Deanna Hart    MRN: [id number] DOB: 1941-02-18  12/19/2019  [name] was observed post Covid-19 immunization for 15 minutes without incident. She was provided with Vaccine Information Sheet and instruction to access the V-Safe system.   [name] was instructed to call 911 with any severe reactions post vaccine: Marland Kitchen Difficulty breathing  . Swelling of face and throat  . A fast heartbeat  . A bad rash all over body  . Dizziness and weakness   Immunizations Administered    Name Date Dose VIS Date Route   Pfizer COVID-19 Vaccine 12/19/2019  8:39 AM 0.3 mL 08/30/2019 Intramuscular   Manufacturer: [place]   Lot: [id number]   [place]: [id number]

## 2019-12-24 DIAGNOSIS — M26629 Arthralgia of temporomandibular joint, unspecified side: Secondary | ICD-10-CM | POA: Insufficient documentation

## 2019-12-24 DIAGNOSIS — H9113 Presbycusis, bilateral: Secondary | ICD-10-CM | POA: Insufficient documentation

## 2019-12-25 NOTE — Progress Notes (Deleted)
Office Visit Note  Patient: Deanna Hart             Date of Birth: 11/11/1940           MRN: [id number]             PCP: [name], MD Referring: [name], MD Visit Date: 12/30/2019 Occupation: @GUAROCC @  Subjective:  No chief complaint on file.   History of Present Illness: Deanna Hart is a 79 y.o. female ***   Activities of Daily Living:  Patient reports morning stiffness for *** {minute/hour:19697}.   Patient {ACTIONS;DENIES/REPORTS:21021675::"Denies"} nocturnal pain.  Difficulty dressing/grooming: {ACTIONS;DENIES/REPORTS:21021675::"Denies"} Difficulty climbing stairs: {ACTIONS;DENIES/REPORTS:21021675::"Denies"} Difficulty getting out of chair: {ACTIONS;DENIES/REPORTS:21021675::"Denies"} Difficulty using hands for taps, buttons, cutlery, and/or writing: {ACTIONS;DENIES/REPORTS:21021675::"Denies"}  No Rheumatology ROS completed.   PMFS History:  Patient Active Problem List   Diagnosis Date Noted  . MGUS (monoclonal gammopathy of unknown significance) 06/01/2017  . Hyperuricemia 06/01/2017  . Elevated LFTs 03/03/2017  . Acute pain of right shoulder 11/28/2016  . High risk medications (not anticoagulants) long-term use 07/19/2016  . Rheumatoid factor positive 07/19/2016  . SLE (systemic lupus erythematosus) ([place]) 07/18/2016  . Primary osteoarthritis of both hands 07/18/2016  . Primary osteoarthritis of both feet 07/18/2016  . Calcaneal spur of both feet 07/18/2016  . Osteoarthritis of both knees 07/18/2016  . Hypertension 07/18/2016  . Obesity 07/18/2016  . Vitamin D deficiency 07/18/2016    Past Medical History:  Diagnosis Date  . Calcaneal spur of both feet 07/18/2016  . Hypertension 07/18/2016  . Obesity 07/18/2016  . Osteoarthritis of both feet 07/18/2016  . Osteoarthritis of both hands 07/18/2016  . 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   Marland Kitchen SLE (systemic lupus erythematosus) ([place]) 07/18/2016     Positive ANA, Positive Ro, Positive Smith, Positive RNP, Positive RF, Negative CCP,  Elevated LFTs  . Vitamin D deficiency 07/18/2016    Family History  Problem Relation Age of Onset  . Cancer Brother   . Cancer Maternal Uncle    Past Surgical History:  Procedure Laterality Date  . BREAST SURGERY    . EYE SURGERY Bilateral 07/2017   cataracts   Social History   Social History Narrative  . Not on file   Immunization History  Administered Date(s) Administered  . PFIZER SARS-COV-2 Vaccination 12/19/2019     Objective: Vital Signs: There were no vitals taken for this visit.   Physical Exam   Musculoskeletal Exam: ***  CDAI Exam: CDAI Score: -- Patient Global: --; Provider Global: -- Swollen: --; Tender: -- Joint Exam 12/30/2019   No joint exam has been documented for this visit   There is currently no information documented on the homunculus. Go to the Rheumatology activity and complete the homunculus joint exam.  Investigation: No additional findings.  Imaging: No results found.  Recent Labs: Lab Results  Component Value Date   WBC 10.5 07/29/2019   HGB 14.4 07/29/2019   PLT 192 07/29/2019   NA 140 07/29/2019   K 3.5 07/29/2019   CL 98 07/29/2019   CO2 31 07/29/2019   GLUCOSE 140 (H) 07/29/2019   BUN 14 07/29/2019   CREATININE 0.80 07/29/2019   BILITOT 0.8 07/29/2019   ALKPHOS 64 07/02/2018   AST 37 (H) 07/29/2019   ALT 34 (H) 07/29/2019   PROT 7.4 07/29/2019   ALBUMIN 3.9 07/02/2018   CALCIUM 9.9 07/29/2019   GFRAA 81 07/29/2019    Speciality Comments: No specialty comments  available.  Procedures:  No procedures performed Allergies: Plaquenil [hydroxychloroquine sulfate]   Assessment / Plan:     Visit Diagnoses: No diagnosis found.  Orders: No orders of the defined types were placed in this encounter.  No orders of the defined types were placed in this encounter.   Face-to-face time spent with patient was *** minutes. Greater than  50% of time was spent in counseling and coordination of care.  Follow-Up Instructions: No follow-ups on file.   [name], PA-C  Note - This record has been created using Dragon software.  Chart creation errors have been sought, but may not always  have been located. Such creation errors do not reflect on  the standard of medical care.

## 2019-12-30 ENCOUNTER — Ambulatory Visit: Payer: Medicare HMO | Admitting: Physician Assistant

## 2020-01-13 ENCOUNTER — Ambulatory Visit: Payer: Medicare HMO | Attending: Internal Medicine

## 2020-01-13 ENCOUNTER — Other Ambulatory Visit: Payer: Self-pay | Admitting: Nurse Practitioner

## 2020-01-13 DIAGNOSIS — Z23 Encounter for immunization: Secondary | ICD-10-CM

## 2020-01-13 DIAGNOSIS — Z13820 Encounter for screening for osteoporosis: Secondary | ICD-10-CM

## 2020-01-13 NOTE — Progress Notes (Signed)
   Covid-19 Vaccination Clinic  Name:  Deanna Hart    MRN: [id number] DOB: Sep 04, 1941  01/13/2020  [name] was observed post Covid-19 immunization for 15 minutes without incident. She was provided with Vaccine Information Sheet and instruction to access the V-Safe system.   [name] was instructed to call 911 with any severe reactions post vaccine: Marland Kitchen Difficulty breathing  . Swelling of face and throat  . A fast heartbeat  . A bad rash all over body  . Dizziness and weakness   Immunizations Administered    Name Date Dose VIS Date Route   Pfizer COVID-19 Vaccine 01/13/2020  8:09 AM 0.3 mL 11/13/2018 Intramuscular   Manufacturer: [place]   Lot: [id number]   [place]: [id number]

## 2020-01-22 NOTE — Progress Notes (Signed)
Office Visit Note  Patient: Deanna Hart             Date of Birth: 05/17/1941           MRN: [id number]             PCP: [name], MD Referring: [name], MD Visit Date: 01/29/2020 Occupation: _0 @  Subjective:  Right shoulder joint pain   History of Present Illness: Deanna Hart is a 79 y.o. female with history of systemic lupus erythematosus, autoimmune hepatitis, and osteoarthritis.  She is taking prednisone 20 mg po daily. She denies any signs of a lupus flare recently.  She denies any recent rashes, raynaud's, oral or nasal ulcerations, sicca symptoms, chest pain, or shortness of breath.  She has been having increased discomfort in the right shoulder joint over the past several months. She works as a Retail buyer 3 days a week, which she feels is exacerbating her discomfort.  She denies any other joint pain or joint swelling.     Activities of Daily Living:  Patient reports morning stiffness for 0 none.   Patient Denies nocturnal pain.  Difficulty dressing/grooming: Denies Difficulty climbing stairs: Reports Difficulty getting out of chair: Reports Difficulty using hands for taps, buttons, cutlery, and/or writing: Denies  Review of Systems  Constitutional: Positive for fatigue.  HENT: Positive for mouth dryness. Negative for mouth sores and nose dryness.   Eyes: Negative for pain, visual disturbance and dryness.  Respiratory: Negative for cough, hemoptysis and difficulty breathing.   Cardiovascular: Positive for swelling in legs/feet. Negative for chest pain, palpitations and hypertension.  Gastrointestinal: Negative for blood in stool, constipation and diarrhea.  Genitourinary: Negative for difficulty urinating and painful urination.  Musculoskeletal: Positive for joint swelling and muscle tenderness. Negative for arthralgias, joint pain, myalgias, muscle weakness, morning stiffness and myalgias.  Skin: Negative for color change, pallor, rash,  hair loss, nodules/bumps, skin tightness, ulcers and sensitivity to sunlight.  Allergic/Immunologic: Negative for susceptible to infections.  Neurological: Negative for dizziness, headaches and weakness.  Hematological: Negative for bruising/bleeding tendency and swollen glands.  Psychiatric/Behavioral: Positive for sleep disturbance. Negative for depressed mood. The patient is not nervous/anxious.     PMFS History:  Patient Active Problem List   Diagnosis Date Noted  . MGUS (monoclonal gammopathy of unknown significance) 06/01/2017  . Hyperuricemia 06/01/2017  . Elevated LFTs 03/03/2017  . Acute pain of right shoulder 11/28/2016  . High risk medications (not anticoagulants) long-term use 07/19/2016  . Rheumatoid factor positive 07/19/2016  . SLE (systemic lupus erythematosus) ([place]) 07/18/2016  . Primary osteoarthritis of both hands 07/18/2016  . Primary osteoarthritis of both feet 07/18/2016  . Calcaneal spur of both feet 07/18/2016  . Osteoarthritis of both knees 07/18/2016  . Hypertension 07/18/2016  . Obesity 07/18/2016  . Vitamin D deficiency 07/18/2016    Past Medical History:  Diagnosis Date  . Calcaneal spur of both feet 07/18/2016  . Hypertension 07/18/2016  . Obesity 07/18/2016  . Osteoarthritis of both feet 07/18/2016  . Osteoarthritis of both hands 07/18/2016  . 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   Marland Kitchen SLE (systemic lupus erythematosus) ([place]) 07/18/2016   Positive ANA, Positive Ro, Positive Smith, Positive RNP, Positive RF, Negative CCP,  Elevated LFTs  . Vitamin D deficiency 07/18/2016    Family History  Problem Relation Age of Onset  . Cancer Brother   . Cancer Maternal Uncle    Past Surgical History:  Procedure Laterality Date  .  BREAST SURGERY    . EYE SURGERY Bilateral 07/2017   cataracts   Social History   Social History Narrative  . Not on file   Immunization History  Administered Date(s) Administered  .  PFIZER SARS-COV-2 Vaccination 12/19/2019, 01/13/2020     Objective: Vital Signs: BP (!) 146/93 (BP Location: Left Arm, Patient Position: Sitting, Cuff Size: Normal)   Pulse 86   Resp 18   Ht 5' 4" (1.626 m)   Wt 226 lb 9.6 oz (102.8 kg)   BMI 38.90 kg/m    Physical Exam Vitals and nursing note reviewed.  Constitutional:      Appearance: She is well-developed.  HENT:     Head: Normocephalic and atraumatic.  Eyes:     Conjunctiva/sclera: Conjunctivae normal.  Pulmonary:     Effort: Pulmonary effort is normal.  Abdominal:     General: Bowel sounds are normal.     Palpations: Abdomen is soft.  Musculoskeletal:     Cervical back: Normal range of motion.  Lymphadenopathy:     Cervical: No cervical adenopathy.  Skin:    General: Skin is warm and dry.     Capillary Refill: Capillary refill takes less than 2 seconds.  Neurological:     Mental Status: She is alert and oriented to person, place, and time.  Psychiatric:        Behavior: Behavior normal.      Musculoskeletal Exam: C-spine good ROM. Thoracic postural kyphosis.  Right shoulder painful abduction and forward flexion.  Left shoulder has good ROM with no discomfort.  Elbow joints, wrist joints, MCPs, PIPs, and DIPs good ROM with no synovitis.  Complete fist formation bilaterally.  Hip joints good ROM.  Left knee warmth but no effusion.  Right knee has good ROM with no warmth or effusion.  Pedal edema bilaterally.   CDAI Exam: CDAI Score: -- Patient Global: --; Provider Global: -- Swollen: --; Tender: -- Joint Exam 01/29/2020   No joint exam has been documented for this visit   There is currently no information documented on the homunculus. Go to the Rheumatology activity and complete the homunculus joint exam.  Investigation: No additional findings.  Imaging: No results found.  Recent Labs: Lab Results  Component Value Date   WBC 10.5 07/29/2019   HGB 14.4 07/29/2019   PLT 192 07/29/2019   NA 140  07/29/2019   K 3.5 07/29/2019   CL 98 07/29/2019   CO2 31 07/29/2019   GLUCOSE 140 (H) 07/29/2019   BUN 14 07/29/2019   CREATININE 0.80 07/29/2019   BILITOT 0.8 07/29/2019   ALKPHOS 64 07/02/2018   AST 37 (H) 07/29/2019   ALT 34 (H) 07/29/2019   PROT 7.4 07/29/2019   ALBUMIN 3.9 07/02/2018   CALCIUM 9.9 07/29/2019   GFRAA 81 07/29/2019    Speciality Comments: No specialty comments available.  Procedures:  No procedures performed Allergies: Plaquenil [hydroxychloroquine sulfate]   Assessment / Plan:     Visit Diagnoses: Other systemic lupus erythematosus with other organ involvement (HCC) - +ANA +Ro +Smith +RNP + RF neg CCP / h/o skin biopsy + by Dr Jones: She has not had any signs or symptoms of a systemic lupus flare.  She is taking prednisone 20 mg daily prescribed by Dr. Hung.  She could not tolerate Plaquenil or Imuran in the past due to GI side effects.  She is not a good candidate for methotrexate due to elevated LFTs secondary to autoimmune hepatitis.  She has not had any   recent rashes, oral or nasal ulcerations, sicca symptoms, enlarged lymph nodes, chest pain, shortness of breath, palpitations, or symptoms of Raynaud's.  No malar rash noted. 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  She declined x-rays of the right shoulder today.  She has no synovitis on exam.  She is currently on prednisone 20 mg daily for autoimmune hepatitis.  Her LFTs have been stable.  She will continue taking prednisone as prescribed by Dr. Hung.  Complements within normal limits, double-stranded DNA negative, ESR within normal limits on 07/29/2019.  We will check autoimmune lab work today.  She was advised to notify us if she develops signs or symptoms of a flare.  She will follow-up in the office in 5 months.- Plan: CBC with Differential/Platelet, COMPLETE METABOLIC PANEL WITH GFR, Urinalysis, Routine w reflex  microscopic, Anti-DNA antibody, double-stranded, C3 and C4, Sedimentation rate  High risk medication use -  Prednisone 20 mg po daily. She could not tolerate Imuran or Plaquenil due to GI SE.  CBC and CMP were drawn today. - Plan: CBC with Differential/Platelet, COMPLETE METABOLIC PANEL WITH GFR  Autoimmune hepatitis (HCC) - She is followed by Dr. Hung. LFTs have been stable.  She is currently taking Prednisone 20 mg daily.  She could not tolerate imuran in the past due to GI upset.  CMP was ordered today. - Plan: COMPLETE METABOLIC PANEL WITH GFR  Primary osteoarthritis of both hands: She has PIP and DIP thickening consistent with osteoarthritis of both hands.  She has no tenderness or inflammation on exam.  She has complete fist formation bilaterally.  Joint protection and muscle strengthening were discussed.   Primary osteoarthritis of both knees: She has good ROM of both knee joints.  Warmth of the left knee joint noted.  No joint effusion.  She is wearing a compression sleeve on the left knee joint currently.   Primary osteoarthritis of both feet: She is not having any discomfort in her feet at this time. She wears proper fitting shoes.   Chronic right shoulder pain: She presents today with right shoulder joint pain which started about 2 months ago.  She has not had any difficulties with ADLs.  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  She has painful forward flexion and abduction of the right shoulder on exam.  She declined x-rays of the right shoulder today.  She was given a handout of shoulder joint exercises to perform.  She was advised to notify us if her discomfort persists or worsens.  Vitamin D deficiency: She is taking a multivitamin with vitamin D.   Other medical conditions are listed as follows:   MGUS (monoclonal gammopathy of unknown significance)  Essential  hypertension  Hyperuricemia  Calcaneal spur of both feet  Orders: Orders Placed This Encounter  Procedures  . CBC with Differential/Platelet  . COMPLETE METABOLIC PANEL WITH GFR  . Urinalysis, Routine w reflex microscopic  . Anti-DNA antibody, double-stranded  . C3 and C4  . Sedimentation rate   No orders of the defined types were placed in this encounter.     Follow-Up Instructions: Return in about 5 months (around 06/30/2020) for Systemic lupus erythematosus.    M , PA-C  Note - This record has been created using Dragon software.  Chart creation errors have been sought, but   may not always  have been located. Such creation errors do not reflect on  the standard of medical care.  

## 2020-01-29 ENCOUNTER — Ambulatory Visit: Payer: Medicare HMO | Admitting: Physician Assistant

## 2020-01-29 ENCOUNTER — Encounter: Payer: Self-pay | Admitting: Physician Assistant

## 2020-01-29 ENCOUNTER — Other Ambulatory Visit: Payer: Self-pay

## 2020-01-29 VITALS — BP 146/93 | HR 86 | Resp 18 | Ht 64.0 in | Wt 226.6 lb

## 2020-01-29 DIAGNOSIS — M170 Bilateral primary osteoarthritis of knee: Secondary | ICD-10-CM

## 2020-01-29 DIAGNOSIS — M19041 Primary osteoarthritis, right hand: Secondary | ICD-10-CM

## 2020-01-29 DIAGNOSIS — Z79899 Other long term (current) drug therapy: Secondary | ICD-10-CM

## 2020-01-29 DIAGNOSIS — M19042 Primary osteoarthritis, left hand: Secondary | ICD-10-CM

## 2020-01-29 DIAGNOSIS — M25511 Pain in right shoulder: Secondary | ICD-10-CM

## 2020-01-29 DIAGNOSIS — M19072 Primary osteoarthritis, left ankle and foot: Secondary | ICD-10-CM

## 2020-01-29 DIAGNOSIS — E790 Hyperuricemia without signs of inflammatory arthritis and tophaceous disease: Secondary | ICD-10-CM

## 2020-01-29 DIAGNOSIS — M3219 Other organ or system involvement in systemic lupus erythematosus: Secondary | ICD-10-CM

## 2020-01-29 DIAGNOSIS — K754 Autoimmune hepatitis: Secondary | ICD-10-CM

## 2020-01-29 DIAGNOSIS — D472 Monoclonal gammopathy: Secondary | ICD-10-CM

## 2020-01-29 DIAGNOSIS — I10 Essential (primary) hypertension: Secondary | ICD-10-CM

## 2020-01-29 DIAGNOSIS — E559 Vitamin D deficiency, unspecified: Secondary | ICD-10-CM

## 2020-01-29 DIAGNOSIS — M7732 Calcaneal spur, left foot: Secondary | ICD-10-CM

## 2020-01-29 DIAGNOSIS — M19071 Primary osteoarthritis, right ankle and foot: Secondary | ICD-10-CM

## 2020-01-29 DIAGNOSIS — G8929 Other chronic pain: Secondary | ICD-10-CM

## 2020-01-29 DIAGNOSIS — M7731 Calcaneal spur, right foot: Secondary | ICD-10-CM

## 2020-01-29 NOTE — Patient Instructions (Signed)
Shoulder Exercises Ask your health care provider which exercises are safe for you. Do exercises exactly as told by your health care provider and adjust them as directed. It is normal to feel mild stretching, pulling, tightness, or discomfort as you do these exercises. Stop right away if you feel sudden pain or your pain gets worse. Do not begin these exercises until told by your health care provider. Stretching exercises External rotation and abduction This exercise is sometimes called corner stretch. This exercise rotates your arm outward (external rotation) and moves your arm out from your body (abduction). 1. Stand in a doorway with one of your feet slightly in front of the other. This is called a staggered stance. If you cannot reach your forearms to the door frame, stand facing a corner of a room. 2. Choose one of the following positions as told by your health care provider: ? Place your hands and forearms on the door frame above your head. ? Place your hands and forearms on the door frame at the height of your head. ? Place your hands on the door frame at the height of your elbows. 3. Slowly move your weight onto your front foot until you feel a stretch across your chest and in the front of your shoulders. Keep your head and chest upright and keep your abdominal muscles tight. 4. Hold for __________ seconds. 5. To release the stretch, shift your weight to your back foot. Repeat __________ times. Complete this exercise __________ times a day. Extension, standing 1. Stand and hold a broomstick, a cane, or a similar object behind your back. ? Your hands should be a little wider than shoulder width apart. ? Your palms should face away from your back. 2. Keeping your elbows straight and your shoulder muscles relaxed, move the stick away from your body until you feel a stretch in your shoulders (extension). ? Avoid shrugging your shoulders while you move the stick. Keep your shoulder blades tucked  down toward the middle of your back. 3. Hold for __________ seconds. 4. Slowly return to the starting position. Repeat __________ times. Complete this exercise __________ times a day. Range-of-motion exercises Pendulum  1. Stand near a wall or a surface that you can hold onto for balance. 2. Bend at the waist and let your left / right arm hang straight down. Use your other arm to support you. Keep your back straight and do not lock your knees. 3. Relax your left / right arm and shoulder muscles, and move your hips and your trunk so your left / right arm swings freely. Your arm should swing because of the motion of your body, not because you are using your arm or shoulder muscles. 4. Keep moving your hips and trunk so your arm swings in the following directions, as told by your health care provider: ? Side to side. ? Forward and backward. ? In clockwise and counterclockwise circles. 5. Continue each motion for __________ seconds, or for as long as told by your health care provider. 6. Slowly return to the starting position. Repeat __________ times. Complete this exercise __________ times a day. Shoulder flexion, standing  1. Stand and hold a broomstick, a cane, or a similar object. Place your hands a little more than shoulder width apart on the object. Your left / right hand should be palm up, and your other hand should be palm down. 2. Keep your elbow straight and your shoulder muscles relaxed. Push the stick up with your healthy arm to   raise your left / right arm in front of your body, and then over your head until you feel a stretch in your shoulder (flexion). ? Avoid shrugging your shoulder while you raise your arm. Keep your shoulder blade tucked down toward the middle of your back. 3. Hold for __________ seconds. 4. Slowly return to the starting position. Repeat __________ times. Complete this exercise __________ times a day. Shoulder abduction, standing 1. Stand and hold a broomstick,  a cane, or a similar object. Place your hands a little more than shoulder width apart on the object. Your left / right hand should be palm up, and your other hand should be palm down. 2. Keep your elbow straight and your shoulder muscles relaxed. Push the object across your body toward your left / right side. Raise your left / right arm to the side of your body (abduction) until you feel a stretch in your shoulder. ? Do not raise your arm above shoulder height unless your health care provider tells you to do that. ? If directed, raise your arm over your head. ? Avoid shrugging your shoulder while you raise your arm. Keep your shoulder blade tucked down toward the middle of your back. 3. Hold for __________ seconds. 4. Slowly return to the starting position. Repeat __________ times. Complete this exercise __________ times a day. Internal rotation  1. Place your left / right hand behind your back, palm up. 2. Use your other hand to dangle an exercise band, a towel, or a similar object over your shoulder. Grasp the band with your left / right hand so you are holding on to both ends. 3. Gently pull up on the band until you feel a stretch in the front of your left / right shoulder. The movement of your arm toward the center of your body is called internal rotation. ? Avoid shrugging your shoulder while you raise your arm. Keep your shoulder blade tucked down toward the middle of your back. 4. Hold for __________ seconds. 5. Release the stretch by letting go of the band and lowering your hands. Repeat __________ times. Complete this exercise __________ times a day. Strengthening exercises External rotation  1. Sit in a stable chair without armrests. 2. Secure an exercise band to a stable object at elbow height on your left / right side. 3. Place a soft object, such as a folded towel or a small pillow, between your left / right upper arm and your body to move your elbow about 4 inches (10 cm) away  from your side. 4. Hold the end of the exercise band so it is tight and there is no slack. 5. Keeping your elbow pressed against the soft object, slowly move your forearm out, away from your abdomen (external rotation). Keep your body steady so only your forearm moves. 6. Hold for __________ seconds. 7. Slowly return to the starting position. Repeat __________ times. Complete this exercise __________ times a day. Shoulder abduction  1. Sit in a stable chair without armrests, or stand up. 2. Hold a __________ weight in your left / right hand, or hold an exercise band with both hands. 3. Start with your arms straight down and your left / right palm facing in, toward your body. 4. Slowly lift your left / right hand out to your side (abduction). Do not lift your hand above shoulder height unless your health care provider tells you that this is safe. ? Keep your arms straight. ? Avoid shrugging your shoulder while you   do this movement. Keep your shoulder blade tucked down toward the middle of your back. 5. Hold for __________ seconds. 6. Slowly lower your arm, and return to the starting position. Repeat __________ times. Complete this exercise __________ times a day. Shoulder extension 1. Sit in a stable chair without armrests, or stand up. 2. Secure an exercise band to a stable object in front of you so it is at shoulder height. 3. Hold one end of the exercise band in each hand. Your palms should face each other. 4. Straighten your elbows and lift your hands up to shoulder height. 5. Step back, away from the secured end of the exercise band, until the band is tight and there is no slack. 6. Squeeze your shoulder blades together as you pull your hands down to the sides of your thighs (extension). Stop when your hands are straight down by your sides. Do not let your hands go behind your body. 7. Hold for __________ seconds. 8. Slowly return to the starting position. Repeat __________ times.  Complete this exercise __________ times a day. Shoulder row 1. Sit in a stable chair without armrests, or stand up. 2. Secure an exercise band to a stable object in front of you so it is at waist height. 3. Hold one end of the exercise band in each hand. Position your palms so that your thumbs are facing the ceiling (neutral position). 4. Bend each of your elbows to a 90-degree angle (right angle) and keep your upper arms at your sides. 5. Step back until the band is tight and there is no slack. 6. Slowly pull your elbows back behind you. 7. Hold for __________ seconds. 8. Slowly return to the starting position. Repeat __________ times. Complete this exercise __________ times a day. Shoulder press-ups  1. Sit in a stable chair that has armrests. Sit upright, with your feet flat on the floor. 2. Put your hands on the armrests so your elbows are bent and your fingers are pointing forward. Your hands should be about even with the sides of your body. 3. Push down on the armrests and use your arms to lift yourself off the chair. Straighten your elbows and lift yourself up as much as you comfortably can. ? Move your shoulder blades down, and avoid letting your shoulders move up toward your ears. ? Keep your feet on the ground. As you get stronger, your feet should support less of your body weight as you lift yourself up. 4. Hold for __________ seconds. 5. Slowly lower yourself back into the chair. Repeat __________ times. Complete this exercise __________ times a day. Wall push-ups  1. Stand so you are facing a stable wall. Your feet should be about one arm-length away from the wall. 2. Lean forward and place your palms on the wall at shoulder height. 3. Keep your feet flat on the floor as you bend your elbows and lean forward toward the wall. 4. Hold for __________ seconds. 5. Straighten your elbows to push yourself back to the starting position. Repeat __________ times. Complete this exercise  __________ times a day. This information is not intended to replace advice given to you by your health care provider. Make sure you discuss any questions you have with your health care provider. Document Revised: 12/28/2018 Document Reviewed: 10/05/2018 Elsevier Patient Education  2020 [employer]  

## 2020-01-30 LAB — COMPLETE METABOLIC PANEL WITH GFR
AG Ratio: 1.4 (calc) (ref 1.0–2.5)
ALT: 61 U/L — ABNORMAL HIGH (ref 6–29)
AST: 48 U/L — ABNORMAL HIGH (ref 10–35)
Albumin: 4.3 g/dL (ref 3.6–5.1)
Alkaline phosphatase (APISO): 132 U/L (ref 37–153)
BUN: 13 mg/dL (ref 7–25)
CO2: 29 mmol/L (ref 20–32)
Calcium: 9.7 mg/dL (ref 8.6–10.4)
Chloride: 98 mmol/L (ref 98–110)
Creat: 0.69 mg/dL (ref 0.60–0.93)
GFR, Est African American: 96 mL/min/{1.73_m2} (ref >=60)
GFR, Est Non African American: 83 mL/min/{1.73_m2} (ref >=60)
Globulin: 3 g/dL (calc) (ref 1.9–3.7)
Glucose, Bld: 278 mg/dL — ABNORMAL HIGH (ref 65–99)
Potassium: 3.3 mmol/L — ABNORMAL LOW (ref 3.5–5.3)
Sodium: 136 mmol/L (ref 135–146)
Total Bilirubin: 0.9 mg/dL (ref 0.2–1.2)
Total Protein: 7.3 g/dL (ref 6.1–8.1)

## 2020-01-30 LAB — URINALYSIS, ROUTINE W REFLEX MICROSCOPIC
Bilirubin Urine: NEGATIVE
Glucose, UA: NEGATIVE
Hgb urine dipstick: NEGATIVE
Hyaline Cast: NONE SEEN /LPF
Ketones, ur: NEGATIVE
Nitrite: POSITIVE — AB
Protein, ur: NEGATIVE
RBC / HPF: NONE SEEN /HPF (ref 0–2)
Specific Gravity, Urine: 1.008 (ref 1.001–1.03)
pH: 6.5 (ref 5.0–8.0)

## 2020-01-30 LAB — CBC WITH DIFFERENTIAL/PLATELET
Absolute Monocytes: 546 cells/uL (ref 200–950)
Basophils Absolute: 32 cells/uL (ref 0–200)
Basophils Relative: 0.3 %
Eosinophils Absolute: 0 cells/uL — ABNORMAL LOW (ref 15–500)
Eosinophils Relative: 0 %
HCT: 46.2 % — ABNORMAL HIGH (ref 35.0–45.0)
Hemoglobin: 15.5 g/dL (ref 11.7–15.5)
Lymphs Abs: 1607 cells/uL (ref 850–3900)
MCH: 31.2 pg (ref 27.0–33.0)
MCHC: 33.5 g/dL (ref 32.0–36.0)
MCV: 93 fL (ref 80.0–100.0)
MPV: 12.4 fL (ref 7.5–12.5)
Monocytes Relative: 5.2 %
Neutro Abs: 8316 cells/uL — ABNORMAL HIGH (ref 1500–7800)
Neutrophils Relative %: 79.2 %
Platelets: 198 10*3/uL (ref 140–400)
RBC: 4.97 10*6/uL (ref 3.80–5.10)
RDW: 12.6 % (ref 11.0–15.0)
Total Lymphocyte: 15.3 %
WBC: 10.5 10*3/uL (ref 3.8–10.8)

## 2020-01-30 LAB — ANTI-DNA ANTIBODY, DOUBLE-STRANDED: ds DNA Ab: 1 IU/mL

## 2020-01-30 LAB — C3 AND C4
C3 Complement: 197 mg/dL — ABNORMAL HIGH (ref 83–193)
C4 Complement: 41 mg/dL (ref 15–57)

## 2020-01-30 LAB — SEDIMENTATION RATE: Sed Rate: 6 mm/h (ref 0–30)

## 2020-01-30 NOTE — Progress Notes (Signed)
DsDNA is negative.  C3 is elevated and C4 WNL.  Labs are not consistent with a systemic lupus flare.  Hct is borderline elevated.  Hgb WNL.   Absolute neutrophils are elevated but WBC count remains WNL.  May be due to UTI evident on UA.   Potassium is borderline low.  Please notify the patient and forward labs to PCP .  LFTs remain elevated.  She is taking prednisone 20 mg daily.  Please forward labs to Dr. Benson Norway.

## 2020-01-30 NOTE — Progress Notes (Signed)
UA is consistent with a UTI.  +nitrites, +leukocytes, and many bacteria.  Please notify the patient and advise the patient to follow up with PCP for further evaluation and treatment.   No protein present in urine.  ESR WNL.

## 2020-02-03 ENCOUNTER — Encounter: Payer: Self-pay | Admitting: *Deleted

## 2020-05-12 ENCOUNTER — Other Ambulatory Visit: Payer: Self-pay | Admitting: *Deleted

## 2020-05-12 DIAGNOSIS — Z79899 Other long term (current) drug therapy: Secondary | ICD-10-CM

## 2020-05-12 LAB — COMPLETE METABOLIC PANEL WITH GFR
AG Ratio: 1.3 (calc) (ref 1.0–2.5)
ALT: 73 U/L — ABNORMAL HIGH (ref 6–29)
AST: 80 U/L — ABNORMAL HIGH (ref 10–35)
Albumin: 4 g/dL (ref 3.6–5.1)
Alkaline phosphatase (APISO): 115 U/L (ref 37–153)
BUN: 16 mg/dL (ref 7–25)
CO2: 27 mmol/L (ref 20–32)
Calcium: 9.8 mg/dL (ref 8.6–10.4)
Chloride: 99 mmol/L (ref 98–110)
Creat: 0.68 mg/dL (ref 0.60–0.88)
GFR, Est African American: 96 mL/min/{1.73_m2} (ref >=60)
GFR, Est Non African American: 83 mL/min/{1.73_m2} (ref >=60)
Globulin: 3.1 g/dL (calc) (ref 1.9–3.7)
Glucose, Bld: 173 mg/dL — ABNORMAL HIGH (ref 65–99)
Potassium: 3.9 mmol/L (ref 3.5–5.3)
Sodium: 139 mmol/L (ref 135–146)
Total Bilirubin: 0.7 mg/dL (ref 0.2–1.2)
Total Protein: 7.1 g/dL (ref 6.1–8.1)

## 2020-05-12 LAB — CBC WITH DIFFERENTIAL/PLATELET
Absolute Monocytes: 978 cells/uL — ABNORMAL HIGH (ref 200–950)
Basophils Absolute: 66 cells/uL (ref 0–200)
Basophils Relative: 0.7 %
Eosinophils Absolute: 103 cells/uL (ref 15–500)
Eosinophils Relative: 1.1 %
HCT: 43.5 % (ref 35.0–45.0)
Hemoglobin: 14.4 g/dL (ref 11.7–15.5)
Lymphs Abs: 2444 cells/uL (ref 850–3900)
MCH: 31.3 pg (ref 27.0–33.0)
MCHC: 33.1 g/dL (ref 32.0–36.0)
MCV: 94.6 fL (ref 80.0–100.0)
MPV: 12 fL (ref 7.5–12.5)
Monocytes Relative: 10.4 %
Neutro Abs: 5809 cells/uL (ref 1500–7800)
Neutrophils Relative %: 61.8 %
Platelets: 212 10*3/uL (ref 140–400)
RBC: 4.6 10*6/uL (ref 3.80–5.10)
RDW: 12.9 % (ref 11.0–15.0)
Total Lymphocyte: 26 %
WBC: 9.4 10*3/uL (ref 3.8–10.8)

## 2020-05-13 NOTE — Progress Notes (Signed)
CBC is normal, ALT and AST are elevated, glucose is elevated.  Please forward labs to Dr. Benson Norway.  Please advise patient to schedule an appointment with Dr. Benson Norway

## 2020-05-14 ENCOUNTER — Telehealth: Payer: Self-pay | Admitting: Rheumatology

## 2020-05-14 NOTE — Telephone Encounter (Signed)
Attempted to contact the patient and left message for patient to call the office.  

## 2020-05-14 NOTE — Telephone Encounter (Signed)
Patient called stating she was returning your call.   °

## 2020-06-17 NOTE — Progress Notes (Signed)
Office Visit Note  Patient: Deanna Hart             Date of Birth: Sep 09, 1941           MRN: [id number]             PCP: [name], MD Referring: [name], MD Visit Date: 07/01/2020 Occupation: @GUAROCC @  Subjective:  Medication monitoring   History of Present Illness: Deanna Hart is a 79 y.o. female with history of systemic lupus erythematous.  She is taking prednisone 20 mg by mouth daily prescribed by Dr. Benson Norway for management of autoimmune hepatitis.  She denies any signs or symptoms of a systemic lupus flare.  She has occasional discomfort in her right shoulder and left knee joint but denies any joint swelling at this time.  She has been wearing a compression sleeve on the left knee joint which has been helpful.  She has been performing stretching exercises on a daily basis.  She denies any facial rashes recently and tries to avoid sun exposure.  She denies any oral or nasal ulcers, sicca symptoms, enlarged lymph nodes, raynaud's, chest pain, palpations, or short of breath.  She denies any other new or worsening symptoms.   Activities of Daily Living:  Patient reports morning stiffness for a few  minutes.   Patient Denies nocturnal pain.  Difficulty dressing/grooming: Denies Difficulty climbing stairs: Reports Difficulty getting out of chair: Reports Difficulty using hands for taps, buttons, cutlery, and/or writing: Denies  Review of Systems  Constitutional: Negative for fatigue.  HENT: Negative for mouth sores, mouth dryness and nose dryness.   Eyes: Negative for pain, itching and dryness.  Respiratory: Negative for shortness of breath and difficulty breathing.   Cardiovascular: Positive for swelling in legs/feet. Negative for chest pain and palpitations.  Gastrointestinal: Negative for blood in stool, constipation and diarrhea.  Endocrine: Negative for increased urination.  Genitourinary: Positive for nocturia. Negative for difficulty urinating and  painful urination.  Musculoskeletal: Positive for arthralgias, joint pain and morning stiffness. Negative for joint swelling, myalgias, muscle tenderness and myalgias.  Skin: Negative for color change, rash and redness.  Allergic/Immunologic: Negative for susceptible to infections.  Neurological: Negative for dizziness, numbness, headaches, memory loss and weakness.  Hematological: Negative for bruising/bleeding tendency.  Psychiatric/Behavioral: Negative for confusion and sleep disturbance.    PMFS History:  Patient Active Problem List   Diagnosis Date Noted  . MGUS (monoclonal gammopathy of unknown significance) 06/01/2017  . Hyperuricemia 06/01/2017  . Elevated LFTs 03/03/2017  . Acute pain of right shoulder 11/28/2016  . High risk medications (not anticoagulants) long-term use 07/19/2016  . Rheumatoid factor positive 07/19/2016  . SLE (systemic lupus erythematosus) ([place]) 07/18/2016  . Primary osteoarthritis of both hands 07/18/2016  . Primary osteoarthritis of both feet 07/18/2016  . Calcaneal spur of both feet 07/18/2016  . Osteoarthritis of both knees 07/18/2016  . Hypertension 07/18/2016  . Obesity 07/18/2016  . Vitamin D deficiency 07/18/2016    Past Medical History:  Diagnosis Date  . Calcaneal spur of both feet 07/18/2016  . Hypertension 07/18/2016  . Obesity 07/18/2016  . Osteoarthritis of both feet 07/18/2016  . Osteoarthritis of both hands 07/18/2016  . 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   Marland Kitchen SLE (systemic lupus erythematosus) ([place]) 07/18/2016   Positive ANA, Positive Ro, Positive Smith, Positive RNP, Positive RF, Negative CCP,  Elevated LFTs  . Vitamin D deficiency 07/18/2016    Family History  Problem  Relation Age of Onset  . Cancer Brother   . Cancer Maternal Uncle    Past Surgical History:  Procedure Laterality Date  . BREAST SURGERY    . EYE SURGERY Bilateral 07/2017   cataracts   Social History   Social  History Narrative  . Not on file   Immunization History  Administered Date(s) Administered  . PFIZER SARS-COV-2 Vaccination 12/19/2019, 01/13/2020     Objective: Vital Signs: BP 140/88 (BP Location: Left Arm, Patient Position: Sitting, Cuff Size: Normal)   Pulse 72   Resp 16   Ht _0  (1.626 m)   Wt 219 lb 9.6 oz (99.6 kg)   BMI 37.69 kg/m    Physical Exam Vitals and nursing note reviewed.  Constitutional:      Appearance: She is well-developed.  HENT:     Head: Normocephalic and atraumatic.  Eyes:     Conjunctiva/sclera: Conjunctivae normal.  Pulmonary:     Effort: Pulmonary effort is normal.  Abdominal:     Palpations: Abdomen is soft.  Musculoskeletal:     Cervical back: Normal range of motion.  Skin:    General: Skin is warm and dry.     Capillary Refill: Capillary refill takes less than 2 seconds.  Neurological:     Mental Status: She is alert and oriented to person, place, and time.  Psychiatric:        Behavior: Behavior normal.      Musculoskeletal Exam:  C-spine good ROM with no discomfort.  Postural thoracic kyphosis noted.  No midline spinal tenderness.  No SI joint tenderness. Shoulder joints, elbow joints, wrist joints, MCPs, PIPs, and DIPs good ROM with no synovitis.  Complete fist formation bilaterally.  PIP and DIP thickening consistent with osteoarthritis of both hands.  [place] joint prominence bilaterally.  Hip joints good ROM with no discomfort.  Knee joints good ROM with no warmth or effusion.  Left knee slight valgus deformity with thickening noted.  Ankle joints good ROM with no tenderness.  Pedal edema noted bilaterally.   CDAI Exam: CDAI Score: -- Patient Global: --; Provider Global: -- Swollen: --; Tender: -- Joint Exam 07/01/2020   No joint exam has been documented for this visit   There is currently no information documented on the homunculus. Go to the Rheumatology activity and complete the homunculus joint exam.  Investigation: No  additional findings.  Imaging: No results found.  Recent Labs: Lab Results  Component Value Date   WBC 9.4 05/12/2020   HGB 14.4 05/12/2020   PLT 212 05/12/2020   NA 139 05/12/2020   K 3.9 05/12/2020   CL 99 05/12/2020   CO2 27 05/12/2020   GLUCOSE 173 (H) 05/12/2020   BUN 16 05/12/2020   CREATININE 0.68 05/12/2020   BILITOT 0.7 05/12/2020   ALKPHOS 64 07/02/2018   AST 80 (H) 05/12/2020   ALT 73 (H) 05/12/2020   PROT 7.1 05/12/2020   ALBUMIN 3.9 07/02/2018   CALCIUM 9.8 05/12/2020   GFRAA 96 05/12/2020    Speciality Comments: No specialty comments available.  Procedures:  No procedures performed Allergies: Plaquenil [hydroxychloroquine sulfate]   Assessment / Plan:     Visit Diagnoses: Other systemic lupus erythematosus with other organ involvement ([place]) - +ANA +Ro +Smith +RNP + RF neg CCP / h/o skin biopsy + by Dr Ronnald Ramp: She has not had any signs or symptoms of a systemic lupus flare recently.  She is on long-term prednisone 20 mg by mouth daily.  She is followed  closely by Dr. Benson Norway for management of autoimmune hepatitis.  She previously tried taking Imuran and Plaquenil but had to discontinue due to GI side effects.  She is not a good candidate for methotrexate due to elevation in LFTs.  Lab work from 01/29/2020 was reviewed with the patient today in the office: C4 within normal limits, C3 197, double-stranded DNA was negative, and ESR 6.  These labs are not consistent with a flare.  She experiences occasional discomfort in her right shoulder and left knee joint but her discomfort has been tolerable and she has been performing stretching exercises on a daily basis.  She has not had any recent facial rashes, oral or nasal ulcerations, sicca symptoms, symptoms of Raynaud's, enlarged lymph nodes, chest pain, or shortness of breath.  She recently had a rash on her chest and applied a topical agent and her rash resolved.  No rash was evident on examination today.  She was advised to  notify us if she starts developing recurrent rashes.  She was advised to avoid direct sun exposure and wear sunscreen SPF 50 on a daily basis.  She will continue on prednisone 20 mg by mouth daily for management of autoimmune hepatitis.  She was encouraged to continue to see Dr. Benson Norway on a regular basis.  We will recheck the following lab work today.  She was advised to notify us if she develops signs or symptoms of a systemic lupus flare.  She will follow-up in the office in 5 months.- Plan: CBC with Differential/Platelet, COMPLETE METABOLIC PANEL WITH GFR, Urinalysis, Routine w reflex microscopic, Anti-DNA antibody, double-stranded, Sedimentation rate, C3 and C4  High risk medication use - Prednisone 20 mg po daily. She could not tolerate Imuran or Plaquenil due to GI SE. CBC and CMP were drawn on 05/12/2020.  At that time her LFTs were elevated and she was advised to increase her prednisone from 10 mg to 20 mg by mouth daily by Dr. Benson Norway.  She has not had her lab work rechecked since increasing the dose of prednisone.  Orders for CBC and CMP were released today.  Her next lab work will be due in January and every 3 months to monitor for drug toxicity.- Plan: CBC with Differential/Platelet, COMPLETE METABOLIC PANEL WITH GFR She has not had any recent infections.  She has received both COVID-19 vaccinations and was encouraged to receive the third dose.  She is advised to notify us or her PCP if she develops a COVID-19 infection in order to receive the antibody infusion.  She was encouraged to continue to wear a mask and social distance.  She voiced understanding.   Autoimmune hepatitis ([place]) - She is followed closely by Dr. Benson Norway. - Plan: COMPLETE METABOLIC PANEL WITH GFR  Primary osteoarthritis of both hands: She has PIP and DIP thickening consistent with osteoarthritis of both hands.  She is able to make a complete fist bilaterally.  No inflammation was noted on examination today.  She has no difficulty with  ADLs.  Primary osteoarthritis of both knees: She experiences intermittent discomfort in her left knee joint.  She has been wearing a compression knee sleeve which has been helpful.  She has good range of motion of both knee joints on examination today with no warmth or effusion.  We discussed the importance of lower extremity muscle strengthening.  Primary osteoarthritis of both feet: She is not experiencing any discomfort in her feet at this time.  She wears proper fitting shoes.  Chronic right shoulder  pain: She experiences intermittent discomfort and stiffness in her right shoulder joint.  She has full range of motion on examination today with some discomfort with abduction.  No tenderness to palpation on exam.  She was encouraged to continue to perform shoulder joint exercises on a daily basis.  She was advised to notify us if her discomfort persists or worsens and we will further evaluate.  Vitamin D deficiency: She is taking vitamin D 2,000 units daily.   Other medical conditions are listed as follows:   MGUS (monoclonal gammopathy of unknown significance)  Hyperuricemia  Essential hypertension  Calcaneal spur of both feet  Orders: Orders Placed This Encounter  Procedures  . CBC with Differential/Platelet  . COMPLETE METABOLIC PANEL WITH GFR  . Urinalysis, Routine w reflex microscopic  . Anti-DNA antibody, double-stranded  . Sedimentation rate  . C3 and C4   No orders of the defined types were placed in this encounter.    Follow-Up Instructions: Return in about 5 months (around 11/29/2020) for Systemic lupus erythematosus, Osteoarthritis.   [name], PA-C  Note - This record has been created using Dragon software.  Chart creation errors have been sought, but may not always  have been located. Such creation errors do not reflect on  the standard of medical care.

## 2020-06-21 IMAGING — MG DIGITAL SCREENING BILAT W/ TOMO W/ CAD
8 series · 8 of 24 positions shown · non-contrast
Comparison: Previous exam(s).

CLINICAL DATA: Screening.

EXAM:
DIGITAL SCREENING BILATERAL MAMMOGRAM WITH TOMO AND CAD

[L CC synth-2D]
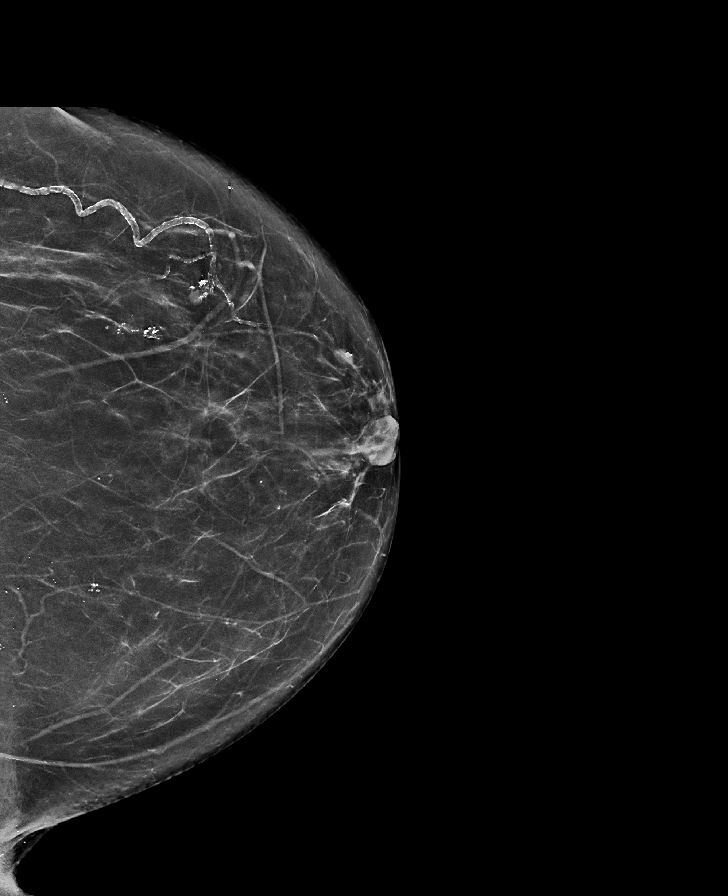

[L MLO synth-2D]
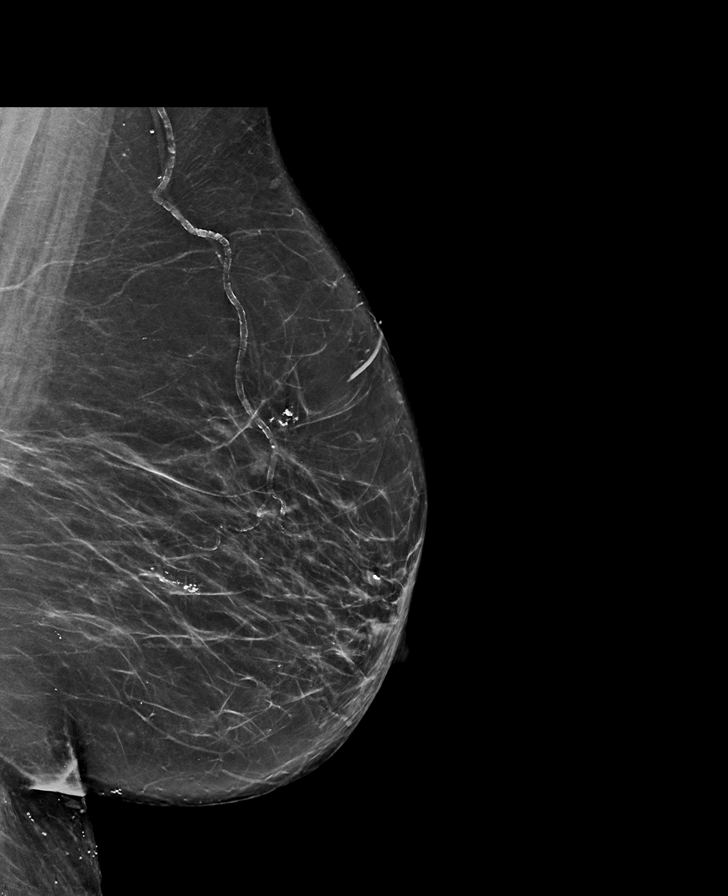

[R MLO synth-2D]
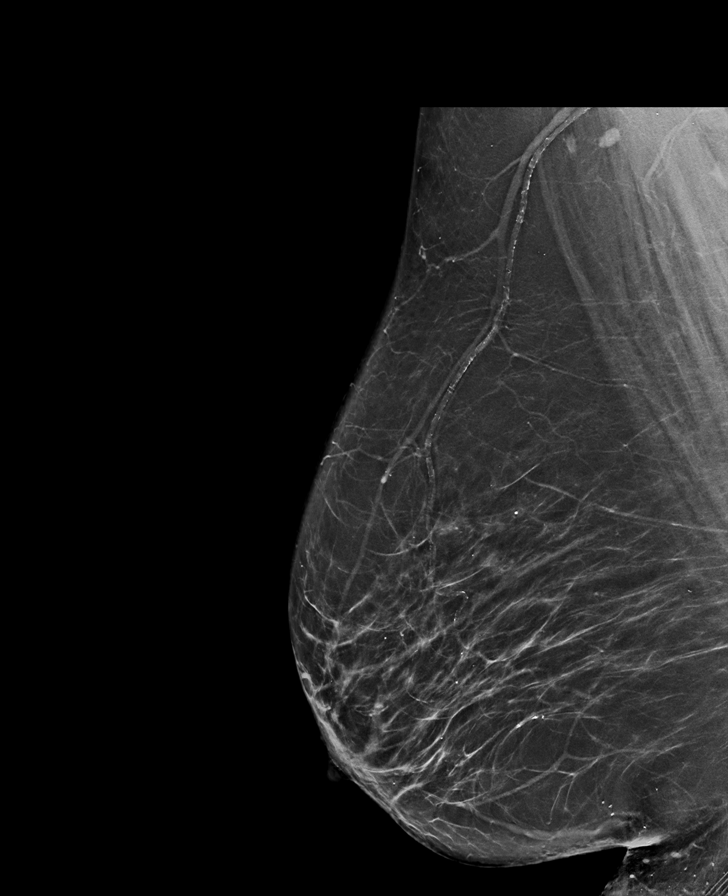

[R CC synth-2D]
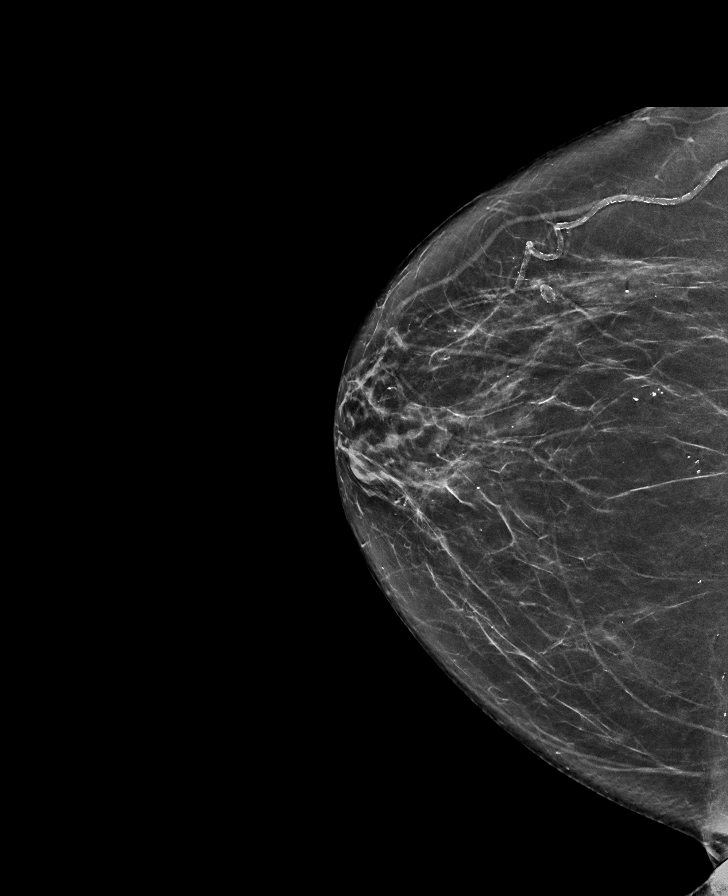

[L MLO tomo · tomo slice 39/77.0]
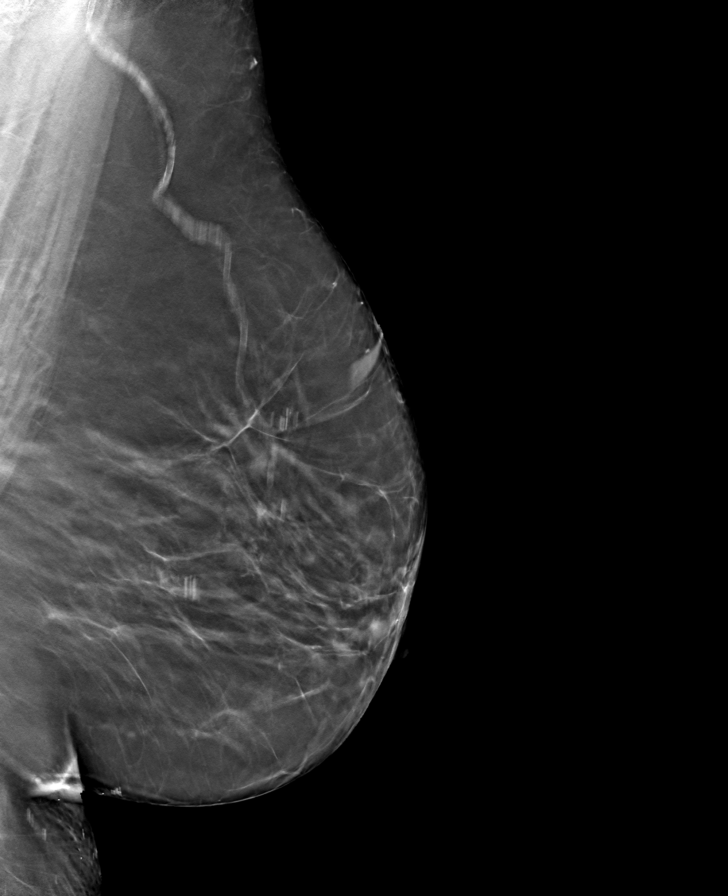

[R CC tomo · tomo slice 35/70.0]
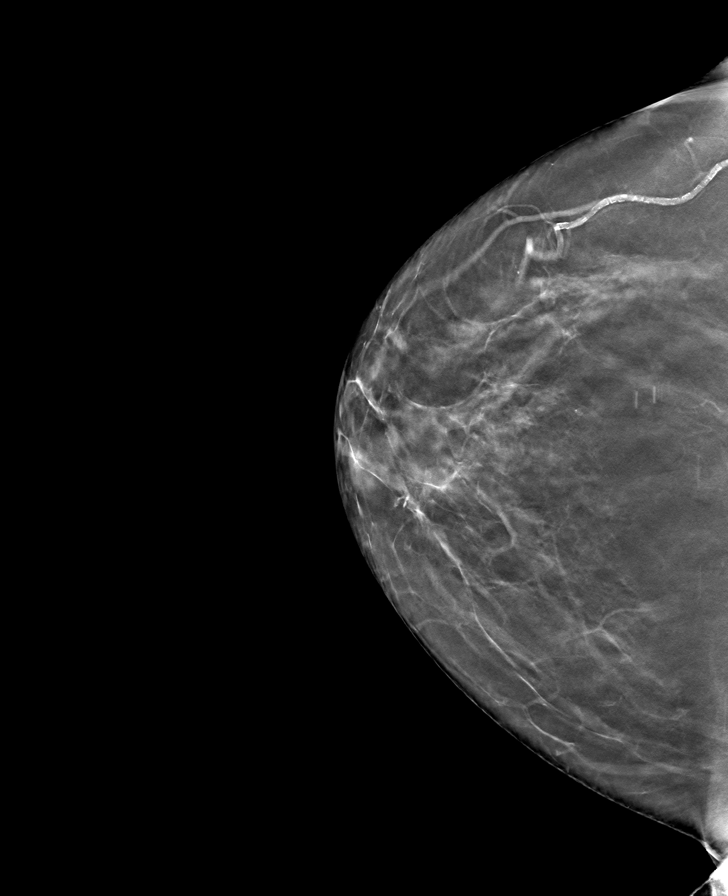

[R MLO tomo · tomo slice 41/82.0]
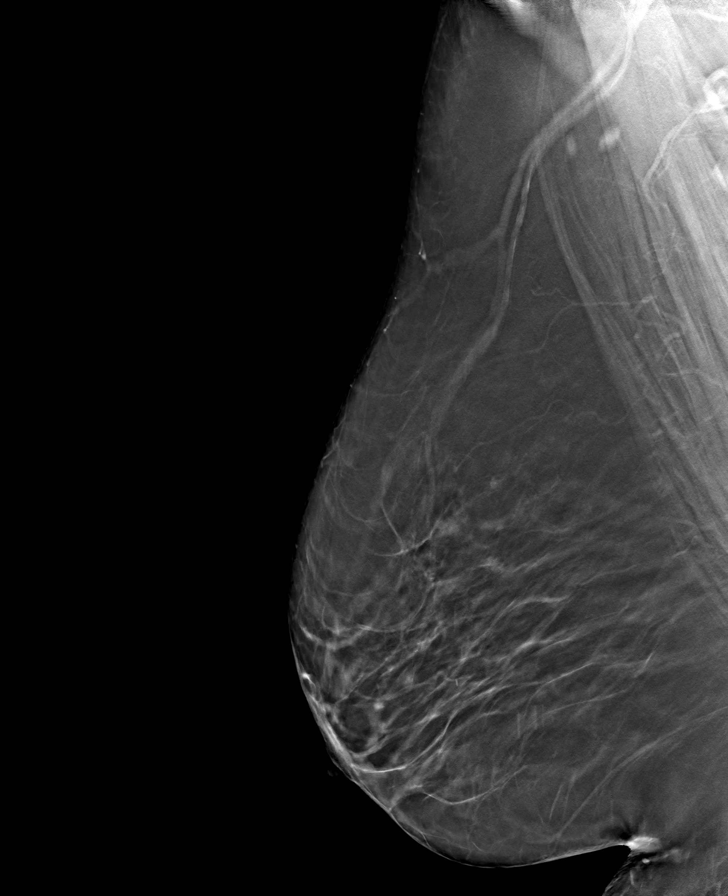

[L CC tomo · tomo slice 35/69.0]
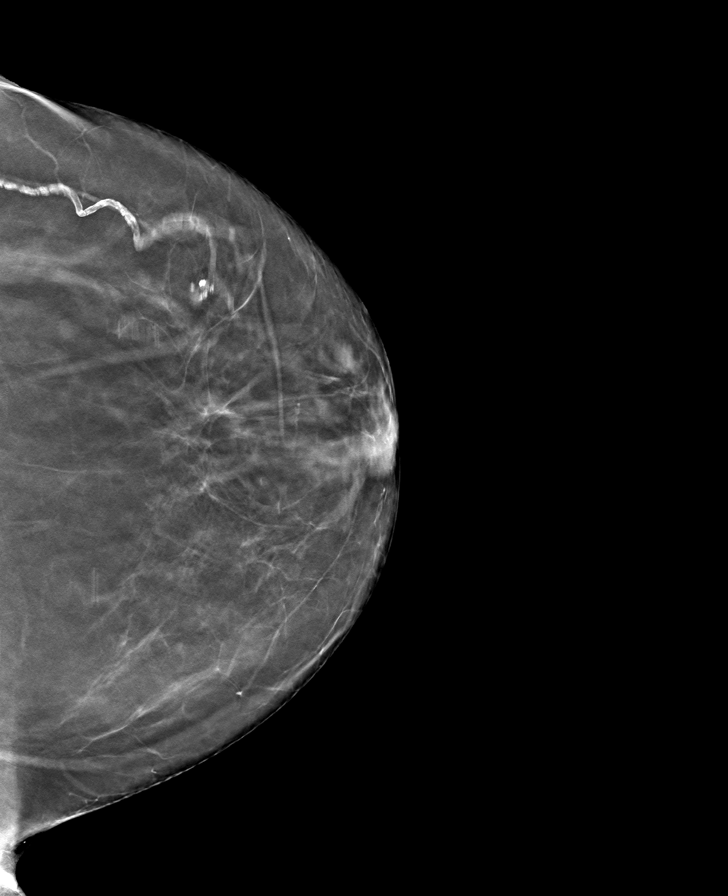

[8 of 24 positions shown; findings below may reference images not displayed]

ACR Breast Density Category b: There are scattered areas of
fibroglandular density.
FINDINGS: There are no findings suspicious for malignancy. Images were
processed with CAD.
IMPRESSION: No mammographic evidence of malignancy. A result letter of this
screening mammogram will be mailed directly to the patient.

RECOMMENDATION:
Screening mammogram in one year. (Code:[id number])

BI-RADS CATEGORY  1: Negative.

## 2020-07-01 ENCOUNTER — Ambulatory Visit: Payer: Medicare HMO | Admitting: Physician Assistant

## 2020-07-01 ENCOUNTER — Encounter: Payer: Self-pay | Admitting: Physician Assistant

## 2020-07-01 ENCOUNTER — Other Ambulatory Visit: Payer: Self-pay

## 2020-07-01 VITALS — BP 140/88 | HR 72 | Resp 16 | Ht 64.0 in | Wt 219.6 lb

## 2020-07-01 DIAGNOSIS — M7731 Calcaneal spur, right foot: Secondary | ICD-10-CM

## 2020-07-01 DIAGNOSIS — M25511 Pain in right shoulder: Secondary | ICD-10-CM

## 2020-07-01 DIAGNOSIS — M7732 Calcaneal spur, left foot: Secondary | ICD-10-CM

## 2020-07-01 DIAGNOSIS — D472 Monoclonal gammopathy: Secondary | ICD-10-CM

## 2020-07-01 DIAGNOSIS — M19072 Primary osteoarthritis, left ankle and foot: Secondary | ICD-10-CM

## 2020-07-01 DIAGNOSIS — M19071 Primary osteoarthritis, right ankle and foot: Secondary | ICD-10-CM

## 2020-07-01 DIAGNOSIS — E559 Vitamin D deficiency, unspecified: Secondary | ICD-10-CM

## 2020-07-01 DIAGNOSIS — M3219 Other organ or system involvement in systemic lupus erythematosus: Secondary | ICD-10-CM | POA: Diagnosis not present

## 2020-07-01 DIAGNOSIS — E790 Hyperuricemia without signs of inflammatory arthritis and tophaceous disease: Secondary | ICD-10-CM

## 2020-07-01 DIAGNOSIS — K754 Autoimmune hepatitis: Secondary | ICD-10-CM

## 2020-07-01 DIAGNOSIS — I10 Essential (primary) hypertension: Secondary | ICD-10-CM

## 2020-07-01 DIAGNOSIS — G8929 Other chronic pain: Secondary | ICD-10-CM

## 2020-07-01 DIAGNOSIS — M19042 Primary osteoarthritis, left hand: Secondary | ICD-10-CM

## 2020-07-01 DIAGNOSIS — M19041 Primary osteoarthritis, right hand: Secondary | ICD-10-CM

## 2020-07-01 DIAGNOSIS — Z79899 Other long term (current) drug therapy: Secondary | ICD-10-CM | POA: Diagnosis not present

## 2020-07-01 DIAGNOSIS — M170 Bilateral primary osteoarthritis of knee: Secondary | ICD-10-CM

## 2020-07-01 NOTE — Patient Instructions (Signed)
Standing Labs We placed an order today for your standing lab work.   Please have your standing labs drawn in January and every 3 months   If possible, please have your labs drawn 2 weeks prior to your appointment so that the provider can discuss your results at your appointment.  We have open lab daily Monday through Thursday from 8:30-12:30 PM and 1:30-4:30 PM and Friday from 8:30-12:30 PM and 1:30-4:00 PM at the office of Dr. Shaili Deveshwar, [facility].   Please be advised, patients with office appointments requiring lab work will take precedents over walk-in lab work.  If possible, please come for your lab work on Monday and Friday afternoons, as you may experience shorter wait times. The office is located at [address], [address], [place], [place] [address] No appointment is necessary.   Labs are drawn by [facility]. Please bring your co-pay at the time of your lab draw.  You may receive a bill from [facility] for your lab work.  If you wish to have your labs drawn at another location, please call the office 24 hours in advance to send orders.  If you have any questions regarding directions or hours of operation,  please call [phone].   As a reminder, please drink plenty of water prior to coming for your lab work. Thanks! COVID-19 vaccine recommendations:   COVID-19 vaccine is recommended for everyone (unless you are allergic to a vaccine component), even if you are on a medication that suppresses your immune system.   If you are on Methotrexate, Cellcept (mycophenolate), Rinvoq, Xeljanz, and Olumiant- hold the medication for 1 week after each vaccine. Hold Methotrexate for 2 weeks after the single dose COVID-19 vaccine.   If you are on Orencia subcutaneous injection - hold medication one week prior to and one week after the first COVID-19 vaccine dose (only).   If you are on Orencia IV infusions- time vaccination administration so that the first COVID-19 vaccination  will occur four weeks after the infusion and postpone the subsequent infusion by one week.   If you are on Cyclophosphamide or Rituxan infusions please contact your doctor prior to receiving the COVID-19 vaccine.   Do not take Tylenol or any anti-inflammatory medications (NSAIDs) 24 hours prior to the COVID-19 vaccination.   There is no direct evidence about the efficacy of the COVID-19 vaccine in individuals who are on medications that suppress the immune system.   Even if you are fully vaccinated, and you are on any medications that suppress your immune system, please continue to wear a mask, maintain at least six feet social distance and practice hand hygiene.   If you develop a COVID-19 infection, please contact your PCP or our office to determine if you need antibody infusion.  The booster vaccine is now available for immunocompromised patients. It is advised that if you had Pfizer vaccine you should get Pfizer booster.  If you had a Moderna vaccine then you should get a Moderna booster. Johnson and Johnson does not have a booster vaccine at this time.  Please see the following web sites for updated information.   [email or web address]://[email or web address]/[email or web address]-Vaccination-Patient-Resources.[email or web address]  [email or web address]://[email or web address]/About-[email or web address]/[email or web address]-Releases/ID/1159     

## 2020-07-02 LAB — URINALYSIS, ROUTINE W REFLEX MICROSCOPIC
Bacteria, UA: NONE SEEN /HPF
Bilirubin Urine: NEGATIVE
Glucose, UA: NEGATIVE
Hgb urine dipstick: NEGATIVE
Hyaline Cast: NONE SEEN /LPF
Ketones, ur: NEGATIVE
Nitrite: NEGATIVE
Protein, ur: NEGATIVE
Specific Gravity, Urine: 1.008 (ref 1.001–1.03)
pH: 6.5 (ref 5.0–8.0)

## 2020-07-02 LAB — CBC WITH DIFFERENTIAL/PLATELET
Absolute Monocytes: 549 cells/uL (ref 200–950)
Basophils Absolute: 36 cells/uL (ref 0–200)
Basophils Relative: 0.4 %
Eosinophils Absolute: 0 cells/uL — ABNORMAL LOW (ref 15–500)
Eosinophils Relative: 0 %
HCT: 44.3 % (ref 35.0–45.0)
Hemoglobin: 15.2 g/dL (ref 11.7–15.5)
Lymphs Abs: 1305 cells/uL (ref 850–3900)
MCH: 32.8 pg (ref 27.0–33.0)
MCHC: 34.3 g/dL (ref 32.0–36.0)
MCV: 95.5 fL (ref 80.0–100.0)
MPV: 12.2 fL (ref 7.5–12.5)
Monocytes Relative: 6.1 %
Neutro Abs: 7110 cells/uL (ref 1500–7800)
Neutrophils Relative %: 79 %
Platelets: 206 10*3/uL (ref 140–400)
RBC: 4.64 10*6/uL (ref 3.80–5.10)
RDW: 12.5 % (ref 11.0–15.0)
Total Lymphocyte: 14.5 %
WBC: 9 10*3/uL (ref 3.8–10.8)

## 2020-07-02 LAB — COMPLETE METABOLIC PANEL WITH GFR
AG Ratio: 1.4 (calc) (ref 1.0–2.5)
ALT: 71 U/L — ABNORMAL HIGH (ref 6–29)
AST: 55 U/L — ABNORMAL HIGH (ref 10–35)
Albumin: 4.3 g/dL (ref 3.6–5.1)
Alkaline phosphatase (APISO): 131 U/L (ref 37–153)
BUN: 17 mg/dL (ref 7–25)
CO2: 27 mmol/L (ref 20–32)
Calcium: 9.5 mg/dL (ref 8.6–10.4)
Chloride: 98 mmol/L (ref 98–110)
Creat: 0.67 mg/dL (ref 0.60–0.88)
GFR, Est African American: 96 mL/min/{1.73_m2} (ref >=60)
GFR, Est Non African American: 83 mL/min/{1.73_m2} (ref >=60)
Globulin: 3.1 g/dL (calc) (ref 1.9–3.7)
Glucose, Bld: 206 mg/dL — ABNORMAL HIGH (ref 65–99)
Potassium: 3.5 mmol/L (ref 3.5–5.3)
Sodium: 137 mmol/L (ref 135–146)
Total Bilirubin: 0.7 mg/dL (ref 0.2–1.2)
Total Protein: 7.4 g/dL (ref 6.1–8.1)

## 2020-07-02 LAB — ANTI-DNA ANTIBODY, DOUBLE-STRANDED: ds DNA Ab: 2 IU/mL

## 2020-07-02 LAB — SEDIMENTATION RATE: Sed Rate: 11 mm/h (ref 0–30)

## 2020-07-02 LAB — C3 AND C4
C3 Complement: 195 mg/dL — ABNORMAL HIGH (ref 83–193)
C4 Complement: 40 mg/dL (ref 15–57)

## 2020-07-02 NOTE — Progress Notes (Signed)
Absolute eosinophils 0, WBC count is WNL.  Rest of CBC WNL.   Glucose is elevated-206. Please notify the patient and advise her to monitor her blood glucose closely.   LFTs remain elevated despite taking Prednisone 20 mg daily.  Please forward lab results to Dr. Benson Norway.   ESR WNL.   DsDNA is negative.   C3 mildly elevated and C4 WNL.   UA revealed 3+ leukocytes.  Negative for nitrites and bacteria. Please advise the patient to be evaluated by PCP if she develops symptoms of a UTI.   No proteinuria.  Labs are not consistent with a lupus flare at this time.

## 2020-10-23 DIAGNOSIS — E119 Type 2 diabetes mellitus without complications: Secondary | ICD-10-CM | POA: Insufficient documentation

## 2020-11-17 NOTE — Progress Notes (Signed)
Office Visit Note  Patient: Deanna Hart             Date of Birth: 05/09/1941           MRN: [id number]             PCP: Suella Broad, FNP Referring: [name], MD Visit Date: 12/01/2020 Occupation: @GUAROCC @  Subjective:  Medication monitoring.   History of Present Illness: Deanna Hart is a 80 y.o. female with a history of systemic lupus dermatosis, autoimmune hepatitis and osteoarthritis.  She states she has been followed by Dr. Benson Norway closely.  She had to increase her prednisone dose to 40 mg p.o. daily.  She could not tolerate Plaquenil and Imuran due to GI side effects.  She denies any joint pain or joint swelling.  There is no history of oral ulcers, nasal ulcers, rash, photosensitivity, sicca symptoms, inflammatory arthritis or lymphadenopathy.  She denies raynaud's phenominon.  She states that her DEXA scan is pending.  Activities of Daily Living:  Patient reports morning stiffness for 1-2 minutes.   Patient Denies nocturnal pain.  Difficulty dressing/grooming: Denies Difficulty climbing stairs: Reports Difficulty getting out of chair: Reports Difficulty using hands for taps, buttons, cutlery, and/or writing: Denies  Review of Systems  Constitutional: Positive for fatigue. Negative for night sweats, weight gain and weight loss.  HENT: Negative for mouth sores, trouble swallowing, trouble swallowing, mouth dryness and nose dryness.   Eyes: Positive for itching. Negative for pain, redness, visual disturbance and dryness.  Respiratory: Negative for cough, hemoptysis, shortness of breath and difficulty breathing.   Cardiovascular: Negative for chest pain, palpitations, hypertension, irregular heartbeat and swelling in legs/feet.  Gastrointestinal: Negative for abdominal pain, blood in stool, constipation and diarrhea.  Endocrine: Negative for increased urination.  Genitourinary: Negative for painful urination and vaginal dryness.  Musculoskeletal:  Positive for arthralgias, joint pain, muscle weakness and morning stiffness. Negative for joint swelling, myalgias, muscle tenderness and myalgias.  Skin: Negative for color change, rash, hair loss, redness, skin tightness, ulcers and sensitivity to sunlight.  Allergic/Immunologic: Negative for susceptible to infections.  Neurological: Positive for headaches and weakness. Negative for dizziness, numbness, memory loss and night sweats.  Hematological: Negative for swollen glands.  Psychiatric/Behavioral: Positive for sleep disturbance. Negative for depressed mood and confusion. The patient is not nervous/anxious.     PMFS History:  Patient Active Problem List   Diagnosis Date Noted  . MGUS (monoclonal gammopathy of unknown significance) 06/01/2017  . Hyperuricemia 06/01/2017  . Elevated LFTs 03/03/2017  . Acute pain of right shoulder 11/28/2016  . High risk medications (not anticoagulants) long-term use 07/19/2016  . Rheumatoid factor positive 07/19/2016  . SLE (systemic lupus erythematosus) ([place]) 07/18/2016  . Primary osteoarthritis of both hands 07/18/2016  . Primary osteoarthritis of both feet 07/18/2016  . Calcaneal spur of both feet 07/18/2016  . Osteoarthritis of both knees 07/18/2016  . Hypertension 07/18/2016  . Obesity 07/18/2016  . Vitamin D deficiency 07/18/2016    Past Medical History:  Diagnosis Date  . Calcaneal spur of both feet 07/18/2016  . Hypertension 07/18/2016  . Obesity 07/18/2016  . Osteoarthritis of both feet 07/18/2016  . Osteoarthritis of both hands 07/18/2016  . 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   Marland Kitchen SLE (systemic lupus erythematosus) ([place]) 07/18/2016   Positive ANA, Positive Ro, Positive Smith, Positive RNP, Positive RF, Negative CCP,  Elevated LFTs  . Vitamin D deficiency 07/18/2016    Family  History  Problem Relation Age of Onset  . Cancer Brother   . Cancer Maternal Uncle    Past Surgical History:   Procedure Laterality Date  . BREAST SURGERY    . EYE SURGERY Bilateral 07/2017   cataracts   Social History   Social History Narrative  . Not on file   Immunization History  Administered Date(s) Administered  . PFIZER(Purple Top)SARS-COV-2 Vaccination 12/19/2019, 01/13/2020, 09/30/2020     Objective: Vital Signs: BP (!) 146/73 (BP Location: Left Arm, Patient Position: Sitting, Cuff Size: Normal)   Pulse 77   Ht 5\' 4"  (1.626 m)   Wt 211 lb 9.6 oz (96 kg)   BMI 36.32 kg/m    Physical Exam Vitals and nursing note reviewed.  Constitutional:      Appearance: She is well-developed.  HENT:     Head: Normocephalic and atraumatic.  Eyes:     Conjunctiva/sclera: Conjunctivae normal.  Cardiovascular:     Rate and Rhythm: Normal rate and regular rhythm.     Heart sounds: Normal heart sounds.  Pulmonary:     Effort: Pulmonary effort is normal.     Breath sounds: Normal breath sounds.  Abdominal:     General: Bowel sounds are normal.     Palpations: Abdomen is soft.  Musculoskeletal:     Cervical back: Normal range of motion.  Lymphadenopathy:     Cervical: No cervical adenopathy.  Skin:    General: Skin is warm and dry.     Capillary Refill: Capillary refill takes less than 2 seconds.  Neurological:     Mental Status: She is alert and oriented to person, place, and time.  Psychiatric:        Behavior: Behavior normal.      Musculoskeletal Exam: C-spine thoracic and lumbar spine with good range of motion.  Shoulder joints, elbow joints, wrist joints, MCPs PIPs and DIPs with good range of motion with no synovitis.  Hip joints, knee joints, ankles, MTPs and PIPs with good range of motion with no synovitis.  CDAI Exam: CDAI Score: - Patient Global: -; Provider Global: - Swollen: -; Tender: - Joint Exam 12/01/2020   No joint exam has been documented for this visit   There is currently no information documented on the homunculus. Go to the Rheumatology activity and  complete the homunculus joint exam.  Investigation: No additional findings.  Imaging: No results found.  Recent Labs: Lab Results  Component Value Date   WBC 9.0 07/01/2020   HGB 15.2 07/01/2020   PLT 206 07/01/2020   NA 137 07/01/2020   K 3.5 07/01/2020   CL 98 07/01/2020   CO2 27 07/01/2020   GLUCOSE 206 (H) 07/01/2020   BUN 17 07/01/2020   CREATININE 0.67 07/01/2020   BILITOT 0.7 07/01/2020   ALKPHOS 64 07/02/2018   AST 55 (H) 07/01/2020   ALT 71 (H) 07/01/2020   PROT 7.4 07/01/2020   ALBUMIN 3.9 07/02/2018   CALCIUM 9.5 07/01/2020   GFRAA 96 07/01/2020    Speciality Comments: No specialty comments available.  Procedures:  No procedures performed Allergies: Plaquenil [hydroxychloroquine sulfate]   Assessment / Plan:     Visit Diagnoses: Other systemic lupus erythematosus with other organ involvement ([place]) - +ANA +Ro +Smith +RNP + RF neg CCP / h/o skin biopsy + by Dr Ronnald Ramp: Patient is currently on no DMARDs as she had intolerance to Plaquenil, Imuran.  She cannot take methotrexate due to elevated LFTs.  She has been on prednisone.  She  has had no recurrence of rash.  Use of sunscreen was emphasized as summer is approaching.  High risk medication use - Prednisone 40 mg po daily per patient. She could not tolerate Imuran or Plaquenil due to GI SE.  Patient had CMP done by Dr. Benson Norway last week.  We will get the results.  Autoimmune hepatitis ([place]) - She is followed closely by Dr. Benson Norway.  According the patient she is currently on prednisone 40 mg p.o. daily.  Primary osteoarthritis of both hands-she is currently not having any discomfort.  Primary osteoarthritis of both knees-she denies pain.  Primary osteoarthritis of both feet-doing well.  MGUS (monoclonal gammopathy of unknown significance)-followed by hematology.  Vitamin D deficiency-use of vitamin D 2000 units daily was discussed.  Hyperuricemia-she is on allopurinol.  She denies any episodes of  gout.  Essential hypertension-her systolic blood pressure is elevated.  Have advised her to monitor it.  Calcaneal spur of both feet  Osteoporosis screening-I see that she had a DEXA pending.  Have advised her is schedule for DEXA.  Orders: Orders Placed This Encounter  Procedures  . CBC with Differential/Platelet  . Urinalysis, Routine w reflex microscopic  . Sedimentation rate  . Rheumatoid factor  . ANA  . Sjogrens syndrome-A extractable nuclear antibody  . Anti-Smith antibody  . RNP Antibody   No orders of the defined types were placed in this encounter.     Follow-Up Instructions: Return in about 5 months (around 05/03/2021) for Autoimmune disease, Gout, Osteoarthritis.   Bo Merino, MD  Note - This record has been created using Editor, commissioning.  Chart creation errors have been sought, but may not always  have been located. Such creation errors do not reflect on  the standard of medical care.

## 2020-12-01 ENCOUNTER — Ambulatory Visit: Payer: Medicare HMO | Admitting: Rheumatology

## 2020-12-01 ENCOUNTER — Telehealth: Payer: Self-pay | Admitting: *Deleted

## 2020-12-01 ENCOUNTER — Encounter: Payer: Self-pay | Admitting: Rheumatology

## 2020-12-01 ENCOUNTER — Other Ambulatory Visit: Payer: Self-pay

## 2020-12-01 VITALS — BP 146/73 | HR 77 | Ht 64.0 in | Wt 211.6 lb

## 2020-12-01 DIAGNOSIS — E559 Vitamin D deficiency, unspecified: Secondary | ICD-10-CM

## 2020-12-01 DIAGNOSIS — I10 Essential (primary) hypertension: Secondary | ICD-10-CM

## 2020-12-01 DIAGNOSIS — K754 Autoimmune hepatitis: Secondary | ICD-10-CM

## 2020-12-01 DIAGNOSIS — M19042 Primary osteoarthritis, left hand: Secondary | ICD-10-CM

## 2020-12-01 DIAGNOSIS — M7732 Calcaneal spur, left foot: Secondary | ICD-10-CM

## 2020-12-01 DIAGNOSIS — M7731 Calcaneal spur, right foot: Secondary | ICD-10-CM

## 2020-12-01 DIAGNOSIS — M3219 Other organ or system involvement in systemic lupus erythematosus: Secondary | ICD-10-CM | POA: Diagnosis not present

## 2020-12-01 DIAGNOSIS — E790 Hyperuricemia without signs of inflammatory arthritis and tophaceous disease: Secondary | ICD-10-CM

## 2020-12-01 DIAGNOSIS — M19071 Primary osteoarthritis, right ankle and foot: Secondary | ICD-10-CM

## 2020-12-01 DIAGNOSIS — Z79899 Other long term (current) drug therapy: Secondary | ICD-10-CM | POA: Diagnosis not present

## 2020-12-01 DIAGNOSIS — M19041 Primary osteoarthritis, right hand: Secondary | ICD-10-CM | POA: Diagnosis not present

## 2020-12-01 DIAGNOSIS — D472 Monoclonal gammopathy: Secondary | ICD-10-CM

## 2020-12-01 DIAGNOSIS — M170 Bilateral primary osteoarthritis of knee: Secondary | ICD-10-CM

## 2020-12-01 DIAGNOSIS — M19072 Primary osteoarthritis, left ankle and foot: Secondary | ICD-10-CM

## 2020-12-01 DIAGNOSIS — G8929 Other chronic pain: Secondary | ICD-10-CM

## 2020-12-01 NOTE — Telephone Encounter (Signed)
Labs received from:[facility] Drawn on: 11/23/2020 Reviewed by:Dr [place] drawn: 11/23/2020  Results:ALT 48 AST 70 Chloride 95 BUN/Creatinine 11 Glucose 166

## 2020-12-02 NOTE — Progress Notes (Signed)
UA is consistent with UTI.  Please advise patient to see her PCP for the treatment of UTI.  Please send lab results to her PCP.

## 2020-12-03 LAB — CBC WITH DIFFERENTIAL/PLATELET
Absolute Monocytes: 696 cells/uL (ref 200–950)
Basophils Absolute: 38 cells/uL (ref 0–200)
Basophils Relative: 0.4 %
Eosinophils Absolute: 0 cells/uL — ABNORMAL LOW (ref 15–500)
Eosinophils Relative: 0 %
HCT: 45.7 % — ABNORMAL HIGH (ref 35.0–45.0)
Hemoglobin: 15.4 g/dL (ref 11.7–15.5)
Lymphs Abs: 2106 cells/uL (ref 850–3900)
MCH: 32.3 pg (ref 27.0–33.0)
MCHC: 33.7 g/dL (ref 32.0–36.0)
MCV: 95.8 fL (ref 80.0–100.0)
MPV: 11.1 fL (ref 7.5–12.5)
Monocytes Relative: 7.4 %
Neutro Abs: 6561 cells/uL (ref 1500–7800)
Neutrophils Relative %: 69.8 %
Platelets: 277 10*3/uL (ref 140–400)
RBC: 4.77 10*6/uL (ref 3.80–5.10)
RDW: 12.9 % (ref 11.0–15.0)
Total Lymphocyte: 22.4 %
WBC: 9.4 10*3/uL (ref 3.8–10.8)

## 2020-12-03 LAB — RNP ANTIBODY: Ribonucleic Protein(ENA) Antibody, IgG: <1 AI

## 2020-12-03 LAB — URINALYSIS, ROUTINE W REFLEX MICROSCOPIC
Bilirubin Urine: NEGATIVE
Hyaline Cast: NONE SEEN /LPF
Ketones, ur: NEGATIVE
Nitrite: POSITIVE — AB
Protein, ur: NEGATIVE
RBC / HPF: NONE SEEN /HPF (ref 0–2)
Specific Gravity, Urine: 1.03 (ref 1.001–1.03)
pH: 5.5 (ref 5.0–8.0)

## 2020-12-03 LAB — SJOGRENS SYNDROME-A EXTRACTABLE NUCLEAR ANTIBODY: SSA (Ro) (ENA) Antibody, IgG: 4.5 AI — AB

## 2020-12-03 LAB — ANTI-SMITH ANTIBODY: ENA SM Ab Ser-aCnc: <1 AI

## 2020-12-03 LAB — SEDIMENTATION RATE: Sed Rate: 2 mm/h (ref 0–30)

## 2020-12-03 LAB — ANA: Anti Nuclear Antibody (ANA): NEGATIVE

## 2020-12-03 LAB — RHEUMATOID FACTOR: Rheumatoid fact SerPl-aCnc: <14 IU/mL (ref <14)

## 2020-12-04 ENCOUNTER — Other Ambulatory Visit: Payer: Self-pay | Admitting: Nurse Practitioner

## 2020-12-04 DIAGNOSIS — Z1231 Encounter for screening mammogram for malignant neoplasm of breast: Secondary | ICD-10-CM

## 2020-12-17 ENCOUNTER — Ambulatory Visit (HOSPITAL_COMMUNITY)
Admission: EM | Admit: 2020-12-17 | Discharge: 2020-12-17 | Disposition: A | Discharge status: Home / Self Care | Payer: Medicare HMO | Attending: Family Medicine | Admitting: Family Medicine

## 2020-12-17 ENCOUNTER — Encounter (HOSPITAL_COMMUNITY): Payer: Self-pay

## 2020-12-17 ENCOUNTER — Other Ambulatory Visit: Payer: Self-pay

## 2020-12-17 DIAGNOSIS — R000 Tachycardia, unspecified: Secondary | ICD-10-CM | POA: Diagnosis not present

## 2020-12-17 DIAGNOSIS — M329 Systemic lupus erythematosus, unspecified: Secondary | ICD-10-CM

## 2020-12-17 DIAGNOSIS — G44209 Tension-type headache, unspecified, not intractable: Secondary | ICD-10-CM | POA: Diagnosis not present

## 2020-12-17 DIAGNOSIS — R7989 Other specified abnormal findings of blood chemistry: Secondary | ICD-10-CM | POA: Diagnosis not present

## 2020-12-17 NOTE — Discharge Instructions (Addendum)
-  If you develop the worst headache of your life, weakness or sensation changes in your arms or legs, vision changes, chest pain, dizziness, shortness of breath-seek additional immediate medical attention.  Call 911 or go to the emergency room. -Call your primary care provider tomorrow to discuss further management of your headache. -Avoid Tylenol and ibuprofen given your history of kidney and liver disease.

## 2020-12-17 NOTE — ED Triage Notes (Signed)
Pt reports headache x 2 months, weakness x 1 week. States she was told by her PCP is related to the medications she is taking. Denies chest pain, nausea, vomiting.

## 2020-12-17 NOTE — ED Provider Notes (Signed)
[facility]    CSN: [id number] Arrival date & time: 12/17/20  [place]      History   Chief Complaint Chief Complaint  Patient presents with  . Headache    HPI Deanna Hart is a 80 y.o. female presenting with intermittent tension headache for 1 week.  Medical history of hypertension, lupus, rheumatoid arthritis.  Describes this as a throbbing pain behind her forehead and in her neck.  Improves on its own but then returns.  States her headache improves when she takes Tylenol, but she has been told not to take Tylenol or ibuprofen due to her various medical conditions that include kidney and liver disease.  Denies absolutely any symptoms, including URI symptoms, cough, nasal congestion, chest pain, left arm pain, weakness, sensation changes in arms or legs, vision changes, hearing changes.  States her primary care provider told her to come here for evaluation.  HPI  Past Medical History:  Diagnosis Date  . Calcaneal spur of both feet 07/18/2016  . Hypertension 07/18/2016  . Obesity 07/18/2016  . Osteoarthritis of both feet 07/18/2016  . Osteoarthritis of both hands 07/18/2016  . 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   Marland Kitchen SLE (systemic lupus erythematosus) ([place]) 07/18/2016   Positive ANA, Positive Ro, Positive Smith, Positive RNP, Positive RF, Negative CCP,  Elevated LFTs  . Vitamin D deficiency 07/18/2016    Patient Active Problem List   Diagnosis Date Noted  . MGUS (monoclonal gammopathy of unknown significance) 06/01/2017  . Hyperuricemia 06/01/2017  . Elevated LFTs 03/03/2017  . Acute pain of right shoulder 11/28/2016  . High risk medications (not anticoagulants) long-term use 07/19/2016  . Rheumatoid factor positive 07/19/2016  . SLE (systemic lupus erythematosus) ([place]) 07/18/2016  . Primary osteoarthritis of both hands 07/18/2016  . Primary osteoarthritis of both feet 07/18/2016  . Calcaneal spur of both feet 07/18/2016   . Osteoarthritis of both knees 07/18/2016  . Hypertension 07/18/2016  . Obesity 07/18/2016  . Vitamin D deficiency 07/18/2016    Past Surgical History:  Procedure Laterality Date  . BREAST SURGERY    . EYE SURGERY Bilateral 07/2017   cataracts    OB History   No obstetric history on file.      Home Medications    Prior to Admission medications   Medication Sig Start Date End Date Taking? Authorizing Provider  potassium citrate (UROCIT-K) 10 MEQ (1080 MG) SR tablet Take 10 mEq by mouth 3 (three) times daily with meals.   Yes [provider]  triamterene-hydrochlorothiazide (DYAZIDE) 37.5-25 MG capsule Take 1 capsule by mouth daily.   Yes [provider]  allopurinol (ZYLOPRIM) 300 MG tablet Take 300 mg by mouth daily.    [provider]  amLODipine (NORVASC) 10 MG tablet Take 10 mg by mouth daily.  12/03/19   [provider]  Cholecalciferol (VITAMIN D3) 50 MCG (2000 UT) capsule Take 2,000 Units by mouth daily.    [provider]  dapagliflozin propanediol (FARXIGA) 10 MG TABS tablet Take 10 mg by mouth daily.    [provider]  metFORMIN (GLUCOPHAGE) 500 MG tablet Take 1,000 mg by mouth 2 (two) times daily with a meal.    [provider]  Multiple Vitamins-Minerals (CENTRUM PO) Take by mouth.    [provider]  potassium chloride (MICRO-K) 10 MEQ CR capsule Take 10 mEq by mouth daily.    [provider]  predniSONE (DELTASONE) 10 MG tablet Take 2  tablets (20 mg total) by mouth at breakfast daily. Patient taking differently: Take 40 mg by mouth daily. Take 2 tablets (20 mg total) by mouth at breakfast daily. 07/29/19   [name], PA-C  triamcinolone cream (KENALOG) 0.1 % Apply 1 application topically 2 (two) times daily.  12/12/16   [provider]    Family History Family History  Problem Relation Age of Onset  . Cancer Brother   . Cancer Maternal Uncle     Social  History Social History   Tobacco Use  . Smoking status: Former Smoker    Packs/day: 0.10    Years: 3.00    Pack years: 0.30    Types: Cigarettes    Quit date: 1972    Years since quitting: 50.2  . Smokeless tobacco: Former Systems developer    Types: Chew    Quit date: Administrator, sports  . Vaping Use: Never used  Substance Use Topics  . Alcohol use: No  . Drug use: Never     Allergies   Plaquenil [hydroxychloroquine sulfate]   Review of Systems Review of Systems  Constitutional: Negative for appetite change, chills, fatigue and fever.  HENT: Negative for congestion, sinus pressure, sore throat, trouble swallowing and voice change.   Eyes: Negative for photophobia, pain, discharge, redness, itching and visual disturbance.  Respiratory: Negative for cough, chest tightness and shortness of breath.   Cardiovascular: Negative for chest pain, palpitations and leg swelling.  Gastrointestinal: Negative for abdominal pain, constipation, diarrhea, nausea and vomiting.  Genitourinary: Negative for dysuria, flank pain, frequency and urgency.  Musculoskeletal: Negative for back pain, gait problem, myalgias, neck pain and neck stiffness.  Neurological: Positive for headaches. Negative for dizziness, tremors, seizures, syncope, facial asymmetry, speech difficulty, weakness, light-headedness and numbness.  Psychiatric/Behavioral: Negative for agitation, decreased concentration, dysphoric mood, hallucinations and suicidal ideas. The patient is not nervous/anxious.   All other systems reviewed and are negative.    Physical Exam Triage Vital Signs ED Triage Vitals  Enc Vitals Group     BP 12/17/20 1823 (!) 144/90     Pulse Rate 12/17/20 1823 (!) 115     Resp 12/17/20 1823 (!) 23     Temp 12/17/20 1823 97.7 F (36.5 C)     Temp Source 12/17/20 1823 Oral     SpO2 12/17/20 1823 97 %     Weight --      Height --      Head Circumference --      Peak Flow --      Pain Score 12/17/20 1828 10      Pain Loc --      Pain Edu? --      Excl. in [place]? --    No data found.  Updated Vital Signs BP (!) 144/90 (BP Location: Right Arm)   Pulse (!) 115   Temp 97.7 F (36.5 C) (Oral)   Resp (!) 23   SpO2 97%   Visual Acuity Right Eye Distance:   Left Eye Distance:   Bilateral Distance:    Right Eye Near:   Left Eye Near:    Bilateral Near:     Physical Exam Vitals reviewed.  Constitutional:      General: She is not in acute distress.    Appearance: Normal appearance. She is not ill-appearing.  HENT:     Head: Normocephalic and atraumatic.  Cardiovascular:     Rate and Rhythm: Normal rate and regular rhythm.     Heart sounds:  Normal heart sounds.  Pulmonary:     Effort: Pulmonary effort is normal.     Breath sounds: Normal breath sounds. No wheezing, rhonchi or rales.  Musculoskeletal:     Cervical back: Normal range of motion and neck supple. No rigidity.  Lymphadenopathy:     Cervical: No cervical adenopathy.  Neurological:     General: No focal deficit present.     Mental Status: She is alert and oriented to person, place, and time. Mental status is at baseline.     Cranial Nerves: Cranial nerves are intact. No cranial nerve deficit or facial asymmetry.     Sensory: Sensation is intact. No sensory deficit.     Motor: Motor function is intact. No weakness.     Coordination: Coordination is intact. Romberg sign negative. Coordination normal.     Gait: Gait is intact. Gait normal.     Comments: CN 2-12 intact. No weakness or numbness in UEs or LEs. Sensation intact.  Psychiatric:        Mood and Affect: Mood normal.        Behavior: Behavior normal.        Thought Content: Thought content normal.        Judgment: Judgment normal.      UC Treatments / Results  Labs (all labs ordered are listed, but only abnormal results are displayed) Labs Reviewed - No data to display  EKG   Radiology No results found.  Procedures Procedures (including critical care  time)  Medications Ordered in UC Medications - No data to display  Initial Impression / Assessment and Plan / UC Course  I have reviewed the triage vital signs and the nursing notes.  Pertinent labs & imaging results that were available during my care of the patient were reviewed by me and considered in my medical decision making (see chart for details).     This patient is an 80 year old female presenting with tension headaches. She is mildly tachycardic but vitals otherwise wnl. Neuro exam completely benign. Denies worst headache of life multiple times.  EKG NSR. No prior EKG for comparison.   Avoid NSAIDs and acetaminophen given history of kidney and liver disease.  I am discharging this patient to home with [facility] ED return precautions discussed. Call PCP tomorrow to discuss further management. Patient verbalizes understanding and agreement.  This chart was dictated using voice recognition software, Dragon. Despite the best efforts of this provider to proofread and correct errors, errors may still occur which can change documentation meaning.  Final Clinical Impressions(s) / UC Diagnoses   Final diagnoses:  Tension headache  Elevated LFTs     Discharge Instructions     -If you develop the worst headache of your life, weakness or sensation changes in your arms or legs, vision changes, chest pain, dizziness, shortness of breath-seek additional immediate medical attention.  Call 911 or go to the emergency room. -Call your primary care provider tomorrow to discuss further management of your headache. -Avoid Tylenol and ibuprofen given your history of kidney and liver disease.    ED Prescriptions    None     PDMP not reviewed this encounter.   Hazel Sams, PA-C 12/17/20 1921

## 2021-01-27 ENCOUNTER — Other Ambulatory Visit: Payer: Self-pay

## 2021-01-27 ENCOUNTER — Ambulatory Visit
Admission: RE | Admit: 2021-01-27 | Discharge: 2021-01-27 | Disposition: A | Discharge status: Home / Self Care | Payer: Medicare HMO | Source: Ambulatory Visit | Attending: Nurse Practitioner | Admitting: Nurse Practitioner

## 2021-01-27 DIAGNOSIS — E538 Deficiency of other specified B group vitamins: Secondary | ICD-10-CM | POA: Insufficient documentation

## 2021-01-27 DIAGNOSIS — Z1231 Encounter for screening mammogram for malignant neoplasm of breast: Secondary | ICD-10-CM

## 2021-02-01 ENCOUNTER — Other Ambulatory Visit: Payer: Self-pay | Admitting: Nurse Practitioner

## 2021-02-01 DIAGNOSIS — Z13820 Encounter for screening for osteoporosis: Secondary | ICD-10-CM

## 2021-03-16 DIAGNOSIS — G9332 Myalgic encephalomyelitis/chronic fatigue syndrome: Secondary | ICD-10-CM | POA: Insufficient documentation

## 2021-04-19 NOTE — Progress Notes (Signed)
Office Visit Note  Patient: Deanna Hart             Date of Birth: 1941-04-08           MRN: [id number]             PCP: Suella Broad, FNP Referring: Suella Broad,* Visit Date: 04/23/2021 Occupation: '@GUAROCC' @  Subjective:  Medication monitoring   History of Present Illness: Deanna Hart is a 80 y.o. female with history of systemic lupus, autoimmune hepatitis, and osteoarthritis.  She is on long term prednisone 20 mg daily.  She denies any signs or symptoms of a systemic lupus flare recently.  She has occasional discomfort in both shoulder joints.  She denies any nocturnal pain recently.  She has occasional pain and stiffness in her knee joints but has been using a cane to assist with ambulation.  She denies any recent falls.  She has not had any recent rashes or photosensitivity.  She denies any oral or nasal ulcerations.  She does have dry mouth and has been drinking a lot of fluids throughout the day.  She denies any symptoms of Raynaud's.  She has not had any fevers or swollen lymph nodes.  She denies any shortness of breath, pleuritic chest pain, or palpitations.  She has not had any recent infections. She continues to follow-up with Dr. Benson Norway on a regular basis.  She has an appointment later this month and will also be having updated lab work at that time.       Activities of Daily Living:  Patient reports morning stiffness for 1 hour.   Patient Denies nocturnal pain.  Difficulty dressing/grooming: Denies Difficulty climbing stairs: Reports Difficulty getting out of chair: Reports Difficulty using hands for taps, buttons, cutlery, and/or writing: Denies  Review of Systems  Constitutional:  Negative for fatigue.  HENT:  Positive for mouth dryness. Negative for mouth sores and nose dryness.   Eyes:  Negative for pain, visual disturbance and dryness.  Respiratory:  Negative for cough, hemoptysis, shortness of breath and difficulty breathing.    Cardiovascular:  Positive for swelling in legs/feet. Negative for chest pain, palpitations and hypertension.  Gastrointestinal:  Negative for blood in stool, constipation and diarrhea.  Endocrine: Positive for excessive thirst. Negative for increased urination.  Genitourinary:  Negative for difficulty urinating and painful urination.  Musculoskeletal:  Positive for joint pain, gait problem, joint pain, joint swelling, morning stiffness and muscle tenderness. Negative for myalgias, muscle weakness and myalgias.  Skin:  Negative for color change, pallor, rash, hair loss, nodules/bumps, skin tightness, ulcers and sensitivity to sunlight.  Allergic/Immunologic: Negative for susceptible to infections.  Neurological:  Positive for weakness. Negative for dizziness, numbness and headaches.  Hematological:  Negative for bruising/bleeding tendency and swollen glands.  Psychiatric/Behavioral:  Negative for depressed mood and sleep disturbance. The patient is not nervous/anxious.    PMFS History:  Patient Active Problem List   Diagnosis Date Noted   MGUS (monoclonal gammopathy of unknown significance) 06/01/2017   Hyperuricemia 06/01/2017   Elevated LFTs 03/03/2017   Acute pain of right shoulder 11/28/2016   High risk medications (not anticoagulants) long-term use 07/19/2016   Rheumatoid factor positive 07/19/2016   SLE (systemic lupus erythematosus) ([place]) 07/18/2016   Primary osteoarthritis of both hands 07/18/2016   Primary osteoarthritis of both feet 07/18/2016   Calcaneal spur of both feet 07/18/2016   Osteoarthritis of both knees 07/18/2016   Hypertension 07/18/2016   Obesity 07/18/2016   Vitamin D  deficiency 07/18/2016    Past Medical History:  Diagnosis Date   Calcaneal spur of both feet 07/18/2016   Gout    Hypertension 07/18/2016   Obesity 07/18/2016   Osteoarthritis of both feet 07/18/2016   Osteoarthritis of both hands 07/18/2016   Osteoarthritis of both knees 07/18/2016    Severe Right lateral, Left med.    SLE (systemic lupus erythematosus) ([place]) 07/18/2016   Positive ANA, Positive Ro, Positive Smith, Positive RNP, Positive RF, Negative CCP,  Elevated LFTs   Vitamin D deficiency 07/18/2016    Family History  Problem Relation Age of Onset   Cancer Brother    Cancer Maternal Uncle    Past Surgical History:  Procedure Laterality Date   BREAST SURGERY     EYE SURGERY Bilateral 07/2017   cataracts   Social History   Social History Narrative   Not on file   Immunization History  Administered Date(s) Administered   PFIZER(Purple Top)SARS-COV-2 Vaccination 12/19/2019, 01/13/2020, 09/30/2020     Objective: Vital Signs: BP (!) 149/74 (BP Location: Left Arm, Patient Position: Sitting, Cuff Size: Normal)   Pulse 84   Resp 16   Ht '5\' 4"'  (1.626 m)   Wt 201 lb (91.2 kg)   BMI 34.50 kg/m    Physical Exam Vitals and nursing note reviewed.  Constitutional:      Appearance: She is well-developed.  HENT:     Head: Normocephalic and atraumatic.  Eyes:     Conjunctiva/sclera: Conjunctivae normal.  Cardiovascular:     Rate and Rhythm: Normal rate and regular rhythm.     Pulses: Normal pulses.     Heart sounds: Normal heart sounds.  Pulmonary:     Effort: Pulmonary effort is normal.     Breath sounds: Normal breath sounds.  Abdominal:     Palpations: Abdomen is soft.     Tenderness: There is no abdominal tenderness.  Musculoskeletal:     Cervical back: Normal range of motion.  Lymphadenopathy:     Cervical: No cervical adenopathy.  Skin:    General: Skin is warm and dry.     Capillary Refill: Capillary refill takes less than 2 seconds.  Neurological:     Mental Status: She is alert and oriented to person, place, and time.  Psychiatric:        Behavior: Behavior normal.     Musculoskeletal Exam: C-spine, thoracic spine, and lumbar spine good ROM. No midline spinal tenderness or SI joint tenderness. Painful ROM of both shoulder joints with  abduction to about 120 degrees.  Elbow joints, wrist joints, MCPs, PIPs, and DIPs have good ROM with no discomfort.  Complete fist formation bilaterally. PIP and DIP thickening consistent with OA of both hands.  Hip joints have slightly limited ROM.  Warmth and limited extension of the left knee.  Right knee has good ROM with no discomfort.  Pedal edema noted bilaterally.  No tenderness over MTP joints.   CDAI Exam: CDAI Score: -- Patient Global: --; Provider Global: -- Swollen: --; Tender: -- Joint Exam 04/23/2021   No joint exam has been documented for this visit   There is currently no information documented on the homunculus. Go to the Rheumatology activity and complete the homunculus joint exam.  Investigation: No additional findings.  Imaging: No results found.  Recent Labs: Lab Results  Component Value Date   WBC 9.4 12/01/2020   HGB 15.4 12/01/2020   PLT 277 12/01/2020   NA 137 07/01/2020   K 3.5 07/01/2020  CL 98 07/01/2020   CO2 27 07/01/2020   GLUCOSE 206 (H) 07/01/2020   BUN 17 07/01/2020   CREATININE 0.67 07/01/2020   BILITOT 0.7 07/01/2020   ALKPHOS 64 07/02/2018   AST 55 (H) 07/01/2020   ALT 71 (H) 07/01/2020   PROT 7.4 07/01/2020   ALBUMIN 3.9 07/02/2018   CALCIUM 9.5 07/01/2020   GFRAA 96 07/01/2020    Speciality Comments: No specialty comments available.  Procedures:  No procedures performed Allergies: Plaquenil [hydroxychloroquine sulfate]   Assessment / Plan:     Visit Diagnoses: Other systemic lupus erythematosus with other organ involvement ([place]) - +ANA +Ro +Smith +RNP + RF neg CCP / h/o skin biopsy + by Dr Ronnald Ramp.  Previously had intolerance to Plaquenil and Imuran: She has not had any signs or symptoms of a systemic lupus flare.  She is not currently taking any immunosuppressive agents.  She cannot tolerate Plaquenil or Imuran in the past.  She has not a good candidate for methotrexate due to elevated LFTs.  She has been following up closely  with Dr. Benson Norway for management of autoimmune hepatitis.  She is currently on Prednisone 20 mg daily, which has been managing her symptoms.  She has not had any recent rashes, photosensitivity, symptoms of Raynaud's, oral or nasal ulcerations, cervical lymphadenopathy, shortness of breath, pleuritic chest pain, or increased fatigue.  Discussed the importance of avoiding direct sun exposure and wearing sunscreen SPF 50 or above on a daily basis.  Lab work from 12/01/20 was reviewed today in the office: ANA negative, Smith-, RNP-, Ro 4.5, RF-, ESR 2.  07/01/20: C3 195, C4 40, dsDNA 2, and ESR 11.  Results were discussed with the patient today in the office.  We will repeat the following lab work today.  She will remain on prednisone 20 mg daily.  She has an upcoming appointment with Dr. Benson Norway this month.  She was advised to notify us if she develops any signs or symptoms of a flare.  She will follow-up in the office in 5 months.- Plan: Protein / creatinine ratio, urine, CBC with Differential/Platelet, COMPLETE METABOLIC PANEL WITH GFR, Anti-DNA antibody, double-stranded, C3 and C4, Sedimentation rate  High risk medication use - Prednisone 20 mg po daily per patient. She could not tolerate Imuran or Plaquenil due to GI SE. CBC and CMP will be drawn today to monitor for drug toxicity.- Plan: CBC with Differential/Platelet, COMPLETE METABOLIC PANEL WITH GFR She is aware of the risks of long term prednisone use.  DEXA scheduled on 08/19/21.    Autoimmune hepatitis ([place]) - She is followed closely by Dr. Benson Norway.  She remains on prednisone 20 mg daily.  AST 70  and ALT 48 on 11/23/20.  CMP was updated today.  Results will be faxed to Dr. Benson Norway.  She has an upcoming appointment with Dr. Benson Norway this month.  Primary osteoarthritis of both hands:  She has PIP and DIP thickening consistent with osteoarthritis of both hands. Complete fist formation bilaterally.  Discussed the importance of joint protection and muscle strengthening.    Primary osteoarthritis of both knees: She has warmth and limited extension of the left knee joint on exam. She experiences intermittent pain in both knee joints.  She has been using a cane to assist with ambulation.  Primary osteoarthritis of both feet: She is not experiencing any discomfort in her feet at this time.  She has good ROM of both ankle joints with no tenderness.  Pedal edema noted bilaterally.  Discussed the  importance of wearing proper fitting shoes and compression stockings.    Other medical conditions are listed as follows:   MGUS (monoclonal gammopathy of unknown significance) - Followed by hematology.  Vitamin D deficiency: She is taking vitamin D 2000 units daily.   Hyperuricemia - She could not tolerate Allopurinol 300 mg daily due to GI side effects.   Essential hypertension: BP was 149/74 today in the office.  Discussed the importance of close blood pressure monitor.  Pedal edema was also noted.  She has only been taking her diuretic as needed according to the patient.   Calcaneal spur of both feet: She is not experiencing any discomfort in her feet at this time. She is wearing proper fitting shoes.   Orders: Orders Placed This Encounter  Procedures   Protein / creatinine ratio, urine   CBC with Differential/Platelet   COMPLETE METABOLIC PANEL WITH GFR   Anti-DNA antibody, double-stranded   C3 and C4   Sedimentation rate    No orders of the defined types were placed in this encounter.     Follow-Up Instructions: Return in about 5 months (around 09/23/2021) for Systemic lupus erythematosus, Autoimmune hepatits .   [name], PA-C  Note - This record has been created using Dragon software.  Chart creation errors have been sought, but may not always  have been located. Such creation errors do not reflect on  the standard of medical care.

## 2021-04-20 DIAGNOSIS — E1165 Type 2 diabetes mellitus with hyperglycemia: Secondary | ICD-10-CM | POA: Diagnosis not present

## 2021-04-20 DIAGNOSIS — I10 Essential (primary) hypertension: Secondary | ICD-10-CM | POA: Diagnosis not present

## 2021-04-20 DIAGNOSIS — E559 Vitamin D deficiency, unspecified: Secondary | ICD-10-CM | POA: Diagnosis not present

## 2021-04-20 DIAGNOSIS — E785 Hyperlipidemia, unspecified: Secondary | ICD-10-CM | POA: Diagnosis not present

## 2021-04-23 ENCOUNTER — Other Ambulatory Visit: Payer: Self-pay

## 2021-04-23 ENCOUNTER — Encounter: Payer: Self-pay | Admitting: Physician Assistant

## 2021-04-23 ENCOUNTER — Ambulatory Visit (INDEPENDENT_AMBULATORY_CARE_PROVIDER_SITE_OTHER): Payer: Medicare Other | Admitting: Physician Assistant

## 2021-04-23 VITALS — BP 149/74 | HR 84 | Resp 16 | Ht 64.0 in | Wt 201.0 lb

## 2021-04-23 DIAGNOSIS — D472 Monoclonal gammopathy: Secondary | ICD-10-CM | POA: Diagnosis not present

## 2021-04-23 DIAGNOSIS — K754 Autoimmune hepatitis: Secondary | ICD-10-CM | POA: Diagnosis not present

## 2021-04-23 DIAGNOSIS — I10 Essential (primary) hypertension: Secondary | ICD-10-CM | POA: Diagnosis not present

## 2021-04-23 DIAGNOSIS — M7732 Calcaneal spur, left foot: Secondary | ICD-10-CM

## 2021-04-23 DIAGNOSIS — E790 Hyperuricemia without signs of inflammatory arthritis and tophaceous disease: Secondary | ICD-10-CM

## 2021-04-23 DIAGNOSIS — M170 Bilateral primary osteoarthritis of knee: Secondary | ICD-10-CM

## 2021-04-23 DIAGNOSIS — M19071 Primary osteoarthritis, right ankle and foot: Secondary | ICD-10-CM | POA: Diagnosis not present

## 2021-04-23 DIAGNOSIS — E559 Vitamin D deficiency, unspecified: Secondary | ICD-10-CM | POA: Diagnosis not present

## 2021-04-23 DIAGNOSIS — M3219 Other organ or system involvement in systemic lupus erythematosus: Secondary | ICD-10-CM | POA: Diagnosis not present

## 2021-04-23 DIAGNOSIS — M7731 Calcaneal spur, right foot: Secondary | ICD-10-CM | POA: Diagnosis not present

## 2021-04-23 DIAGNOSIS — M19042 Primary osteoarthritis, left hand: Secondary | ICD-10-CM | POA: Diagnosis not present

## 2021-04-23 DIAGNOSIS — M19041 Primary osteoarthritis, right hand: Secondary | ICD-10-CM

## 2021-04-23 DIAGNOSIS — M19072 Primary osteoarthritis, left ankle and foot: Secondary | ICD-10-CM

## 2021-04-23 DIAGNOSIS — Z79899 Other long term (current) drug therapy: Secondary | ICD-10-CM

## 2021-04-26 LAB — C3 AND C4
C3 Complement: 167 mg/dL
C4 Complement: 31 mg/dL

## 2021-04-26 LAB — COMPLETE METABOLIC PANEL WITH GFR
AG Ratio: 1.3 (calc) (ref 1.0–2.5)
ALT: 66 U/L — ABNORMAL HIGH (ref 6–29)
AST: 65 U/L — ABNORMAL HIGH (ref 10–35)
Albumin: 3.6 g/dL (ref 3.6–5.1)
Alkaline phosphatase (APISO): 92 U/L (ref 37–153)
BUN/Creatinine Ratio: 19 (calc) (ref 6–22)
BUN: 10 mg/dL (ref 7–25)
CO2: 31 mmol/L (ref 20–32)
Calcium: 8.8 mg/dL (ref 8.6–10.4)
Chloride: 101 mmol/L (ref 98–110)
Creat: 0.53 mg/dL — ABNORMAL LOW (ref 0.60–0.95)
Globulin: 2.7 g/dL (calc) (ref 1.9–3.7)
Glucose, Bld: 111 mg/dL — ABNORMAL HIGH (ref 65–99)
Potassium: 3.4 mmol/L — ABNORMAL LOW (ref 3.5–5.3)
Sodium: 139 mmol/L (ref 135–146)
Total Bilirubin: 0.9 mg/dL (ref 0.2–1.2)
Total Protein: 6.3 g/dL (ref 6.1–8.1)
eGFR: 93 mL/min/{1.73_m2} (ref >=60)

## 2021-04-26 LAB — CBC WITH DIFFERENTIAL/PLATELET
Absolute Monocytes: 964 cells/uL — ABNORMAL HIGH (ref 200–950)
Basophils Absolute: 41 cells/uL (ref 0–200)
Basophils Relative: 0.5 %
Eosinophils Absolute: 49 cells/uL (ref 15–500)
Eosinophils Relative: 0.6 %
HCT: 42.4 % (ref 35.0–45.0)
Hemoglobin: 13.8 g/dL (ref 11.7–15.5)
Lymphs Abs: 2479 cells/uL (ref 850–3900)
MCH: 30.5 pg (ref 27.0–33.0)
MCHC: 32.5 g/dL (ref 32.0–36.0)
MCV: 93.6 fL (ref 80.0–100.0)
MPV: 11.9 fL (ref 7.5–12.5)
Monocytes Relative: 11.9 %
Neutro Abs: 4568 cells/uL (ref 1500–7800)
Neutrophils Relative %: 56.4 %
Platelets: 191 10*3/uL (ref 140–400)
RBC: 4.53 10*6/uL (ref 3.80–5.10)
RDW: 14.2 % (ref 11.0–15.0)
Total Lymphocyte: 30.6 %
WBC: 8.1 10*3/uL (ref 3.8–10.8)

## 2021-04-26 LAB — ANTI-DNA ANTIBODY, DOUBLE-STRANDED: ds DNA Ab: 2 IU/mL

## 2021-04-26 LAB — SEDIMENTATION RATE: Sed Rate: 2 mm/h (ref 0–30)

## 2021-04-30 DIAGNOSIS — E538 Deficiency of other specified B group vitamins: Secondary | ICD-10-CM | POA: Diagnosis not present

## 2021-05-05 ENCOUNTER — Ambulatory Visit: Payer: Medicare HMO | Admitting: Physician Assistant

## 2021-06-02 DIAGNOSIS — E538 Deficiency of other specified B group vitamins: Secondary | ICD-10-CM | POA: Diagnosis not present

## 2021-06-07 DIAGNOSIS — E538 Deficiency of other specified B group vitamins: Secondary | ICD-10-CM | POA: Diagnosis not present

## 2021-06-07 DIAGNOSIS — Z0000 Encounter for general adult medical examination without abnormal findings: Secondary | ICD-10-CM | POA: Diagnosis not present

## 2021-06-07 DIAGNOSIS — Z23 Encounter for immunization: Secondary | ICD-10-CM | POA: Diagnosis not present

## 2021-06-24 DIAGNOSIS — R748 Abnormal levels of other serum enzymes: Secondary | ICD-10-CM | POA: Diagnosis not present

## 2021-06-30 DIAGNOSIS — R748 Abnormal levels of other serum enzymes: Secondary | ICD-10-CM | POA: Diagnosis not present

## 2021-08-19 ENCOUNTER — Ambulatory Visit
Admission: RE | Admit: 2021-08-19 | Discharge: 2021-08-19 | Disposition: A | Discharge status: Home / Self Care | Payer: Medicare Other | Source: Ambulatory Visit | Attending: Nurse Practitioner | Admitting: Nurse Practitioner

## 2021-08-19 DIAGNOSIS — Z13820 Encounter for screening for osteoporosis: Secondary | ICD-10-CM

## 2021-08-27 DIAGNOSIS — R600 Localized edema: Secondary | ICD-10-CM | POA: Insufficient documentation

## 2021-09-20 ENCOUNTER — Other Ambulatory Visit: Payer: Self-pay

## 2021-09-20 ENCOUNTER — Encounter (HOSPITAL_COMMUNITY): Payer: Self-pay

## 2021-09-20 ENCOUNTER — Ambulatory Visit (HOSPITAL_COMMUNITY)
Admission: EM | Admit: 2021-09-20 | Discharge: 2021-09-20 | Disposition: A | Discharge status: Home / Self Care | Payer: Medicare Other | Attending: Internal Medicine | Admitting: Internal Medicine

## 2021-09-20 DIAGNOSIS — R55 Syncope and collapse: Secondary | ICD-10-CM | POA: Insufficient documentation

## 2021-09-20 LAB — CBC WITH DIFFERENTIAL/PLATELET
Abs Immature Granulocytes: 0.29 10*3/uL — ABNORMAL HIGH (ref 0.00–0.07)
Basophils Absolute: 0.1 10*3/uL (ref 0.0–0.1)
Basophils Relative: 1 %
Eosinophils Absolute: 0 10*3/uL (ref 0.0–0.5)
Eosinophils Relative: 0 %
HCT: 45.8 % (ref 36.0–46.0)
Hemoglobin: 15.4 g/dL — ABNORMAL HIGH (ref 12.0–15.0)
Immature Granulocytes: 3 %
Lymphocytes Relative: 13 %
Lymphs Abs: 1.3 10*3/uL (ref 0.7–4.0)
MCH: 31.4 pg (ref 26.0–34.0)
MCHC: 33.6 g/dL (ref 30.0–36.0)
MCV: 93.3 fL (ref 80.0–100.0)
Monocytes Absolute: 0.4 10*3/uL (ref 0.1–1.0)
Monocytes Relative: 4 %
Neutro Abs: 8.1 10*3/uL — ABNORMAL HIGH (ref 1.7–7.7)
Neutrophils Relative %: 79 %
Platelets: 207 10*3/uL (ref 150–400)
RBC: 4.91 MIL/uL (ref 3.87–5.11)
RDW: 13.2 % (ref 11.5–15.5)
WBC: 10.1 10*3/uL (ref 4.0–10.5)
nRBC: 0 % (ref 0.0–0.2)

## 2021-09-20 LAB — COMPREHENSIVE METABOLIC PANEL
ALT: 43 U/L (ref 0–44)
AST: 54 U/L — ABNORMAL HIGH (ref 15–41)
Albumin: 3.7 g/dL (ref 3.5–5.0)
Alkaline Phosphatase: 82 U/L (ref 38–126)
Anion gap: 14 (ref 5–15)
BUN: 17 mg/dL (ref 8–23)
CO2: 26 mmol/L (ref 22–32)
Calcium: 9.6 mg/dL (ref 8.9–10.3)
Chloride: 95 mmol/L — ABNORMAL LOW (ref 98–111)
Creatinine, Ser: 0.71 mg/dL (ref 0.44–1.00)
GFR, Estimated: >60 mL/min (ref >60)
Glucose, Bld: 247 mg/dL — ABNORMAL HIGH (ref 70–99)
Potassium: 3.2 mmol/L — ABNORMAL LOW (ref 3.5–5.1)
Sodium: 135 mmol/L (ref 135–145)
Total Bilirubin: 1.4 mg/dL — ABNORMAL HIGH (ref 0.3–1.2)
Total Protein: 8.1 g/dL (ref 6.5–8.1)

## 2021-09-20 NOTE — ED Triage Notes (Signed)
Pt has loss of consciousness X 3 nights ago; pt is unsure what lead up to it and no complaints of dizziness or any other symptoms.

## 2021-09-20 NOTE — ED Provider Notes (Signed)
[place]    CSN: [id number] Arrival date & time: 09/20/21  1125      History   Chief Complaint Chief Complaint  Patient presents with   Loss of Consciousness    HPI Deanna Hart is a 81 y.o. female comes to the urgent care to be evaluated for loss of consciousness which happened last Thursday.  Patient found himself on the floor after waking up on Thursday morning.  She had vomited all over the floor.  She does not remember getting out of bed.  She denies any confusion after waking up.  No loss of bladder or bowel continence.  Patient has had 2 episodes of generalized shaking sensation after urinating in the bathroom.  No fever or chills.  No diarrhea.  No sick contacts.  No chest pain or chest pressure.  No palpitations.  Patient denies any oral pain or biting his tongue.  After that event patient has felt normal.  No symptoms.  No shortness of breath or wheezing.  Oral intake is maintained.  No loss of weight.  No changes in bowel movements.  No difficulty with his speech.  No extremity weakness. HPI  Past Medical History:  Diagnosis Date   Calcaneal spur of both feet 07/18/2016   Gout    Hypertension 07/18/2016   Obesity 07/18/2016   Osteoarthritis of both feet 07/18/2016   Osteoarthritis of both hands 07/18/2016   Osteoarthritis of both knees 07/18/2016   Severe Right lateral, Left med.    SLE (systemic lupus erythematosus) ([place]) 07/18/2016   Positive ANA, Positive Ro, Positive Smith, Positive RNP, Positive RF, Negative CCP,  Elevated LFTs   Vitamin D deficiency 07/18/2016    Patient Active Problem List   Diagnosis Date Noted   MGUS (monoclonal gammopathy of unknown significance) 06/01/2017   Hyperuricemia 06/01/2017   Elevated LFTs 03/03/2017   Acute pain of right shoulder 11/28/2016   High risk medications (not anticoagulants) long-term use 07/19/2016   Rheumatoid factor positive 07/19/2016   SLE (systemic lupus erythematosus) ([place]) 07/18/2016    Primary osteoarthritis of both hands 07/18/2016   Primary osteoarthritis of both feet 07/18/2016   Calcaneal spur of both feet 07/18/2016   Osteoarthritis of both knees 07/18/2016   Hypertension 07/18/2016   Obesity 07/18/2016   Vitamin D deficiency 07/18/2016    Past Surgical History:  Procedure Laterality Date   BREAST SURGERY     EYE SURGERY Bilateral 07/2017   cataracts    OB History   No obstetric history on file.      Home Medications    Prior to Admission medications   Medication Sig Start Date End Date Taking? Authorizing Provider  allopurinol (ZYLOPRIM) 300 MG tablet Take 300 mg by mouth daily.    [provider]  amLODipine (NORVASC) 10 MG tablet Take 10 mg by mouth daily.  12/03/19   [provider]  Cholecalciferol (VITAMIN D3) 50 MCG (2000 UT) capsule Take 2,000 Units by mouth daily.    [provider]  cyanocobalamin (,VITAMIN B-12,) 1000 MCG/ML injection SMARTSIG:1 Milliliter(s) SUB-Q Every Morning 03/12/21   [provider]  dapagliflozin propanediol (FARXIGA) 10 MG TABS tablet Take 10 mg by mouth daily.    [provider]  metFORMIN (GLUCOPHAGE) 500 MG tablet Take 1,000 mg by mouth 2 (two) times daily with a meal.    [provider]  Multiple Vitamins-Minerals (CENTRUM PO) Take by mouth.    [provider]  nitrofurantoin, macrocrystal-monohydrate, (MACROBID) 100 MG capsule  Take 100 mg by mouth every 12 (twelve) hours. 12/02/20   [provider]  potassium chloride (MICRO-K) 10 MEQ CR capsule Take 10 mEq by mouth daily.    [provider]  potassium citrate (UROCIT-K) 10 MEQ (1080 MG) SR tablet Take 10 mEq by mouth 3 (three) times daily with meals.    [provider]  predniSONE (DELTASONE) 10 MG tablet Take 2 tablets (20 mg total) by mouth at breakfast daily. Patient taking differently: Take 40 mg by mouth daily. Take 2 tablets (20 mg total) by mouth at breakfast daily.  07/29/19   [name], PA-C  triamcinolone cream (KENALOG) 0.1 % Apply 1 application topically 2 (two) times daily.  12/12/16   [provider]  triamterene-hydrochlorothiazide (DYAZIDE) 37.5-25 MG capsule Take 1 capsule by mouth daily.    [provider]    Family History Family History  Problem Relation Age of Onset   Cancer Brother    Cancer Maternal Uncle     Social History Social History   Tobacco Use   Smoking status: Former    Packs/day: 0.10    Years: 3.00    Pack years: 0.30    Types: Cigarettes    Quit date: 1972    Years since quitting: 51.0   Smokeless tobacco: Former    Types: Chew    Quit date: 1972  Vaping Use   Vaping Use: Never used  Substance Use Topics   Alcohol use: No   Drug use: Never     Allergies   Plaquenil [hydroxychloroquine sulfate]   Review of Systems Review of Systems As per HPI  Physical Exam Triage Vital Signs ED Triage Vitals  Enc Vitals Group     BP 09/20/21 1402 (!) 162/94     Pulse Rate 09/20/21 1402 94     Resp 09/20/21 1402 17     Temp 09/20/21 1402 98.2 F (36.8 C)     Temp Source 09/20/21 1402 Oral     SpO2 09/20/21 1402 95 %     Weight --      Height --      Head Circumference --      Peak Flow --      Pain Score 09/20/21 1401 0     Pain Loc --      Pain Edu? --      Excl. in [place]? --    No data found.  Updated Vital Signs BP (!) 162/94 (BP Location: Right Arm)    Pulse 94    Temp 98.2 F (36.8 C) (Oral)    Resp 17    SpO2 95%   Visual Acuity Right Eye Distance:   Left Eye Distance:   Bilateral Distance:    Right Eye Near:   Left Eye Near:    Bilateral Near:     Physical Exam Vitals and nursing note reviewed.  Constitutional:      General: She is not in acute distress.    Appearance: She is not ill-appearing.  HENT:     Right Ear: Tympanic membrane normal.     Left Ear: Tympanic membrane normal.     Mouth/Throat:     Mouth: Mucous membranes are moist.     Pharynx: No  posterior oropharyngeal erythema.  Cardiovascular:     Rate and Rhythm: Normal rate and regular rhythm.     Pulses: Normal pulses.     Heart sounds: Normal heart sounds.  Pulmonary:     Effort: Pulmonary effort  is normal.     Breath sounds: Normal breath sounds.  Abdominal:     General: Bowel sounds are normal.     Palpations: Abdomen is soft.  Musculoskeletal:        General: Normal range of motion.  Neurological:     Mental Status: She is alert.     UC Treatments / Results  Labs (all labs ordered are listed, but only abnormal results are displayed) Labs Reviewed  CBC WITH DIFFERENTIAL/PLATELET  COMPREHENSIVE METABOLIC PANEL    EKG   Radiology No results found.  Procedures Procedures (including critical care time)  Medications Ordered in UC Medications - No data to display  Initial Impression / Assessment and Plan / UC Course  I have reviewed the triage vital signs and the nursing notes.  Pertinent labs & imaging results that were available during my care of the patient were reviewed by me and considered in my medical decision making (see chart for details).     1.  Vasovagal syncope: EKG shows normal sinus rhythm with no acute ST/T wave changes.  No rhythm abnormalities. CBC, CMP We will call patient with recommendations if labs are abnormal.  Patient was able to ambulate to the exam room with no difficulty.  Final Clinical Impressions(s) / UC Diagnoses   Final diagnoses:  Vasovagal syncope     Discharge Instructions      EKG is reassuring We will call you with recommendations if labs are abnormal If symptoms recur please go to the emergency room for further evaluation.   ED Prescriptions   None    PDMP not reviewed this encounter.   Chase Picket, MD 09/20/21 [phone]

## 2021-09-20 NOTE — Discharge Instructions (Signed)
EKG is reassuring We will call you with recommendations if labs are abnormal If symptoms recur please go to the emergency room for further evaluation.

## 2021-09-22 NOTE — Progress Notes (Signed)
Office Visit Note  Patient: Deanna Hart             Date of Birth: August 16, 1941           MRN: [id number]             PCP: Suella Broad, FNP Referring: Suella Broad,* Visit Date: 10/05/2021 Occupation: @GUAROCC @  Subjective:  Follow-up    History of Present Illness: Deanna Hart is a 81 y.o. female with a history of autoimmune hepatitis and autoimmune disease.  She continues to take prednisone 20 mg p.o. daily per Dr. Benson Norway.  She could not tolerate hydroxychloroquine and Imuran.  Patient states she is unable to taper prednisone and is closely followed by Dr. Benson Norway.  She had 2 episodes of syncope in the first week of January and was evaluated in the urgent care.  She found herself on the floor and had vomited.  She does not remember the episode.  She was diagnosed with vasovagal syncope.  She had CBC and and CMP which showed low potassium, elevated AST and elevated glucose.  She denies any history of oral ulcers, nasal ulcers, malar rash photosensitivity, Raynaud's phenomenon, inflammatory arthritis or lymphadenopathy.  Activities of Daily Living:  Patient reports morning stiffness for 0 minutes.   Patient Denies nocturnal pain.  Difficulty dressing/grooming: Denies Difficulty climbing stairs: Denies Difficulty getting out of chair: Reports Difficulty using hands for taps, buttons, cutlery, and/or writing: Denies  Review of Systems  Constitutional:  Negative for fatigue.  HENT:  Negative for mouth sores, mouth dryness and nose dryness.   Eyes:  Negative for pain, itching and dryness.  Respiratory:  Negative for shortness of breath and difficulty breathing.   Cardiovascular:  Negative for chest pain and palpitations.  Gastrointestinal:  Negative for blood in stool, constipation and diarrhea.  Endocrine: Negative for increased urination.  Genitourinary:  Negative for difficulty urinating.  Musculoskeletal:  Negative for joint pain, joint pain, joint  swelling, myalgias, morning stiffness, muscle tenderness and myalgias.  Skin:  Negative for color change, rash and redness.  Allergic/Immunologic: Negative for susceptible to infections.  Neurological:  Negative for dizziness, numbness, headaches, memory loss and weakness.  Hematological:  Negative for bruising/bleeding tendency.  Psychiatric/Behavioral:  Negative for confusion.    PMFS History:  Patient Active Problem List   Diagnosis Date Noted   MGUS (monoclonal gammopathy of unknown significance) 06/01/2017   Hyperuricemia 06/01/2017   Elevated LFTs 03/03/2017   Acute pain of right shoulder 11/28/2016   High risk medications (not anticoagulants) long-term use 07/19/2016   Rheumatoid factor positive 07/19/2016   SLE (systemic lupus erythematosus) ([place]) 07/18/2016   Primary osteoarthritis of both hands 07/18/2016   Primary osteoarthritis of both feet 07/18/2016   Calcaneal spur of both feet 07/18/2016   Osteoarthritis of both knees 07/18/2016   Hypertension 07/18/2016   Obesity 07/18/2016   Vitamin D deficiency 07/18/2016    Past Medical History:  Diagnosis Date   Calcaneal spur of both feet 07/18/2016   Gout    Hypertension 07/18/2016   Obesity 07/18/2016   Osteoarthritis of both feet 07/18/2016   Osteoarthritis of both hands 07/18/2016   Osteoarthritis of both knees 07/18/2016   Severe Right lateral, Left med.    SLE (systemic lupus erythematosus) ([place]) 07/18/2016   Positive ANA, Positive Ro, Positive Smith, Positive RNP, Positive RF, Negative CCP,  Elevated LFTs   Vitamin D deficiency 07/18/2016    Family History  Problem Relation Age of Onset  Cancer Brother    Cancer Maternal Uncle    Past Surgical History:  Procedure Laterality Date   BREAST SURGERY     EYE SURGERY Bilateral 07/2017   cataracts   Social History   Social History Narrative   Not on file   Immunization History  Administered Date(s) Administered   PFIZER(Purple Top)SARS-COV-2 Vaccination  12/19/2019, 01/13/2020, 09/30/2020     Objective: Vital Signs: BP 137/78 (BP Location: Left Arm, Patient Position: Sitting, Cuff Size: Large)    Pulse 90    Ht 5\' 4"  (1.626 m)    Wt 205 lb (93 kg)    BMI 35.19 kg/m    Physical Exam Vitals and nursing note reviewed.  Constitutional:      Appearance: She is well-developed.  HENT:     Head: Normocephalic and atraumatic.  Eyes:     Conjunctiva/sclera: Conjunctivae normal.  Cardiovascular:     Rate and Rhythm: Normal rate and regular rhythm.     Heart sounds: Normal heart sounds.  Pulmonary:     Effort: Pulmonary effort is normal.     Breath sounds: Normal breath sounds.  Abdominal:     General: Bowel sounds are normal.     Palpations: Abdomen is soft.  Musculoskeletal:     Cervical back: Normal range of motion.  Lymphadenopathy:     Cervical: No cervical adenopathy.  Skin:    General: Skin is warm and dry.     Capillary Refill: Capillary refill takes less than 2 seconds.  Neurological:     Mental Status: She is alert and oriented to person, place, and time.  Psychiatric:        Behavior: Behavior normal.     Musculoskeletal Exam: C-spine was in good range of motion.  Shoulder joints, elbow joints, wrist joints, MCPs, PIPs and DIPs with good range of motion with no synovitis.  Hip joints, knee joints, ankles, MTPs and PIPs with good range of motion with no synovitis.    CDAI Exam: CDAI Score: -- Patient Global: --; Provider Global: -- Swollen: --; Tender: -- Joint Exam 10/05/2021   No joint exam has been documented for this visit   There is currently no information documented on the homunculus. Go to the Rheumatology activity and complete the homunculus joint exam.  Investigation: No additional findings.  Imaging: No results found.  Recent Labs: Lab Results  Component Value Date   WBC 10.1 09/20/2021   HGB 15.4 (H) 09/20/2021   PLT 207 09/20/2021   NA 135 09/20/2021   K 3.2 (L) 09/20/2021   CL 95 (L)  09/20/2021   CO2 26 09/20/2021   GLUCOSE 247 (H) 09/20/2021   BUN 17 09/20/2021   CREATININE 0.71 09/20/2021   BILITOT 1.4 (H) 09/20/2021   ALKPHOS 82 09/20/2021   AST 54 (H) 09/20/2021   ALT 43 09/20/2021   PROT 8.1 09/20/2021   ALBUMIN 3.7 09/20/2021   CALCIUM 9.6 09/20/2021   GFRAA 96 07/01/2020    Speciality Comments: No specialty comments available.  Procedures:  No procedures performed Allergies: Plaquenil [hydroxychloroquine sulfate]   Assessment / Plan:     Visit Diagnoses: Other systemic lupus erythematosus with other organ involvement ([place]) - +ANA +Ro +Smith +RNP + RF neg CCP / h/o skin biopsy + by Dr Ronnald Ramp.  Previously had intolerance to Plaquenil and Imuran: -She continues to be on prednisone 20 mg p.o. daily for autoimmune hepatitis by Dr. Benson Norway.  She denies any history of oral ulcers, nasal ulcers, malar rash, photosensitivity,  Raynaud's phenomenon or lymphadenopathy.  Her repeat labs have been all negative except for positive Ro antibody.  We will check autoimmune labs today.  Plan: Protein / creatinine ratio, urine, Anti-DNA antibody, double-stranded, C3 and C4, Sedimentation rate, RNP Antibody, Anti-Smith antibody, Sjogrens syndrome-A extractable nuclear antibody  High risk medication use - Prednisone 20 mg po daily per patient. She could not tolerate Imuran or Plaquenil due to GI SE. -she had CBC and CMP in urgent care recently which showed elevated hemoglobin, elevated AST and elevated glucose.  Autoimmune hepatitis ([place]) - She is followed closely by Dr. Benson Norway.  She continues to be on prednisone 20 mg p.o. daily.  Primary osteoarthritis of both hands-she had PIP and DIP thickening with no synovitis.  She denies any discomfort  Primary osteoarthritis of both knees-she denies any knee pain.  Joint protection muscle strengthening was discussed.  Primary osteoarthritis of both feet-she states the proper fitting shoes has helped her with feet discomfort.  Calcaneal spur  of both feet  MGUS (monoclonal gammopathy of unknown significance) - Followed by hematology.  Hyperuricemia -she believes that she has been off allopurinol.  She denies any episodes of gout.  Vitamin D deficiency-vitamin D was normal couple of years ago.  Essential hypertension-blood pressure was normal today.  Vasovagal syncope-patient had vasovagal syncope in the first week of January.  Patient does not recall the episode of fall.  She will schedule appointment with the PCP for the evaluation of the syncope.  Orders: Orders Placed This Encounter  Procedures   Protein / creatinine ratio, urine   Anti-DNA antibody, double-stranded   C3 and C4   Sedimentation rate   RNP Antibody   Anti-Smith antibody   Sjogrens syndrome-A extractable nuclear antibody   No orders of the defined types were placed in this encounter.    Follow-Up Instructions: Return in about 5 months (around 03/05/2022) for Systemic lupus.   Bo Merino, MD  Note - This record has been created using Editor, commissioning.  Chart creation errors have been sought, but may not always  have been located. Such creation errors do not reflect on  the standard of medical care.

## 2021-10-05 ENCOUNTER — Encounter: Payer: Self-pay | Admitting: Rheumatology

## 2021-10-05 ENCOUNTER — Other Ambulatory Visit: Payer: Self-pay

## 2021-10-05 ENCOUNTER — Ambulatory Visit (INDEPENDENT_AMBULATORY_CARE_PROVIDER_SITE_OTHER): Payer: Commercial Managed Care - HMO | Admitting: Rheumatology

## 2021-10-05 VITALS — BP 137/78 | HR 90 | Ht 64.0 in | Wt 205.0 lb

## 2021-10-05 DIAGNOSIS — D472 Monoclonal gammopathy: Secondary | ICD-10-CM

## 2021-10-05 DIAGNOSIS — M7731 Calcaneal spur, right foot: Secondary | ICD-10-CM

## 2021-10-05 DIAGNOSIS — M19041 Primary osteoarthritis, right hand: Secondary | ICD-10-CM

## 2021-10-05 DIAGNOSIS — E790 Hyperuricemia without signs of inflammatory arthritis and tophaceous disease: Secondary | ICD-10-CM

## 2021-10-05 DIAGNOSIS — K754 Autoimmune hepatitis: Secondary | ICD-10-CM

## 2021-10-05 DIAGNOSIS — Z79899 Other long term (current) drug therapy: Secondary | ICD-10-CM | POA: Diagnosis not present

## 2021-10-05 DIAGNOSIS — M3219 Other organ or system involvement in systemic lupus erythematosus: Secondary | ICD-10-CM | POA: Diagnosis not present

## 2021-10-05 DIAGNOSIS — M7732 Calcaneal spur, left foot: Secondary | ICD-10-CM

## 2021-10-05 DIAGNOSIS — E559 Vitamin D deficiency, unspecified: Secondary | ICD-10-CM

## 2021-10-05 DIAGNOSIS — M19042 Primary osteoarthritis, left hand: Secondary | ICD-10-CM

## 2021-10-05 DIAGNOSIS — R55 Syncope and collapse: Secondary | ICD-10-CM

## 2021-10-05 DIAGNOSIS — M170 Bilateral primary osteoarthritis of knee: Secondary | ICD-10-CM

## 2021-10-05 DIAGNOSIS — M19072 Primary osteoarthritis, left ankle and foot: Secondary | ICD-10-CM

## 2021-10-05 DIAGNOSIS — M19071 Primary osteoarthritis, right ankle and foot: Secondary | ICD-10-CM

## 2021-10-05 DIAGNOSIS — I10 Essential (primary) hypertension: Secondary | ICD-10-CM

## 2021-10-06 DIAGNOSIS — E876 Hypokalemia: Secondary | ICD-10-CM | POA: Insufficient documentation

## 2021-10-06 DIAGNOSIS — R0600 Dyspnea, unspecified: Secondary | ICD-10-CM | POA: Insufficient documentation

## 2021-10-06 DIAGNOSIS — I498 Other specified cardiac arrhythmias: Secondary | ICD-10-CM | POA: Insufficient documentation

## 2021-10-06 LAB — ANTI-DNA ANTIBODY, DOUBLE-STRANDED: ds DNA Ab: 2 IU/mL

## 2021-10-06 LAB — SJOGRENS SYNDROME-A EXTRACTABLE NUCLEAR ANTIBODY: SSA (Ro) (ENA) Antibody, IgG: 5.2 AI — AB

## 2021-10-06 LAB — PROTEIN / CREATININE RATIO, URINE
Creatinine, Urine: 25 mg/dL (ref 20–275)
Protein/Creat Ratio: 240 mg/g creat — ABNORMAL HIGH (ref 24–184)
Protein/Creatinine Ratio: 0.24 mg/mg creat — ABNORMAL HIGH (ref 0.024–0.184)
Total Protein, Urine: 6 mg/dL (ref 5–24)

## 2021-10-06 LAB — C3 AND C4
C3 Complement: 188 mg/dL
C4 Complement: 38 mg/dL

## 2021-10-06 LAB — ANTI-SMITH ANTIBODY: ENA SM Ab Ser-aCnc: <1 AI

## 2021-10-06 LAB — SEDIMENTATION RATE: Sed Rate: 2 mm/h (ref 0–30)

## 2021-10-06 LAB — RNP ANTIBODY: Ribonucleic Protein(ENA) Antibody, IgG: <1 AI

## 2021-10-07 ENCOUNTER — Telehealth: Payer: Self-pay | Admitting: *Deleted

## 2021-10-07 DIAGNOSIS — Z79899 Other long term (current) drug therapy: Secondary | ICD-10-CM

## 2021-10-07 DIAGNOSIS — M3219 Other organ or system involvement in systemic lupus erythematosus: Secondary | ICD-10-CM

## 2021-10-07 NOTE — Progress Notes (Signed)
Ro antibody is positive and stable.  Protein creatinine ratio is mildly elevated.  Double-stranded DNA is negative, sed rate is normal, RNP is negative, Tamala Julian is negative, sed rate is normal, complements are normal.  We will repeat urine test in 3 months.  Please forward results to her PCP.

## 2021-10-07 NOTE — Telephone Encounter (Signed)
-----   Message from Bo Merino, MD sent at 10/07/2021  1:07 PM EST ----- Ro antibody is positive and stable.  Protein creatinine ratio is mildly elevated.  Double-stranded DNA is negative, sed rate is normal, RNP is negative, Tamala Julian is negative, sed rate is normal, complements are normal.  We will repeat urine test in 3 mo nths.  Please forward results to her PCP.

## 2022-02-23 NOTE — Progress Notes (Deleted)
Office Visit Note  Patient: Deanna Hart             Date of Birth: Sep 29, 1940           MRN: [id number]             PCP: Suella Broad, FNP Referring: Suella Broad,* Visit Date: 03/08/2022 Occupation: '@GUAROCC' @  Subjective:     History of Present Illness: Deanna Hart is a 81 y.o. female with history of systemic lupus erythematosus, autoimmune hepatitis, and osteoarthritis.  She is taking prednisone 20 mg daily.   Lab work on 10/05/21 was reviewed today in the office: Ro antibody remains positive, RNP negative, smith negative, ESR WNL, complements WNL, and protein creatinine ratio is slightly elevated. The following lab work will be updated today.    Activities of Daily Living:  Patient reports morning stiffness for *** {minute/hour:19697}.   Patient {ACTIONS;DENIES/REPORTS:21021675::"Denies"} nocturnal pain.  Difficulty dressing/grooming: {ACTIONS;DENIES/REPORTS:21021675::"Denies"} Difficulty climbing stairs: {ACTIONS;DENIES/REPORTS:21021675::"Denies"} Difficulty getting out of chair: {ACTIONS;DENIES/REPORTS:21021675::"Denies"} Difficulty using hands for taps, buttons, cutlery, and/or writing: {ACTIONS;DENIES/REPORTS:21021675::"Denies"}  No Rheumatology ROS completed.   PMFS History:  Patient Active Problem List   Diagnosis Date Noted   MGUS (monoclonal gammopathy of unknown significance) 06/01/2017   Hyperuricemia 06/01/2017   Elevated LFTs 03/03/2017   Acute pain of right shoulder 11/28/2016   High risk medications (not anticoagulants) long-term use 07/19/2016   Rheumatoid factor positive 07/19/2016   SLE (systemic lupus erythematosus) ([place]) 07/18/2016   Primary osteoarthritis of both hands 07/18/2016   Primary osteoarthritis of both feet 07/18/2016   Calcaneal spur of both feet 07/18/2016   Osteoarthritis of both knees 07/18/2016   Hypertension 07/18/2016   Obesity 07/18/2016   Vitamin D deficiency 07/18/2016    Past Medical History:   Diagnosis Date   Calcaneal spur of both feet 07/18/2016   Gout    Hypertension 07/18/2016   Obesity 07/18/2016   Osteoarthritis of both feet 07/18/2016   Osteoarthritis of both hands 07/18/2016   Osteoarthritis of both knees 07/18/2016   Severe Right lateral, Left med.    SLE (systemic lupus erythematosus) ([place]) 07/18/2016   Positive ANA, Positive Ro, Positive Smith, Positive RNP, Positive RF, Negative CCP,  Elevated LFTs   Vitamin D deficiency 07/18/2016    Family History  Problem Relation Age of Onset   Cancer Brother    Cancer Maternal Uncle    Past Surgical History:  Procedure Laterality Date   BREAST SURGERY     EYE SURGERY Bilateral 07/2017   cataracts   Social History   Social History Narrative   Not on file   Immunization History  Administered Date(s) Administered   PFIZER(Purple Top)SARS-COV-2 Vaccination 12/19/2019, 01/13/2020, 09/30/2020     Objective: Vital Signs: There were no vitals taken for this visit.   Physical Exam Vitals and nursing note reviewed.  Constitutional:      Appearance: She is well-developed.  HENT:     Head: Normocephalic and atraumatic.  Eyes:     Conjunctiva/sclera: Conjunctivae normal.  Cardiovascular:     Rate and Rhythm: Normal rate and regular rhythm.     Heart sounds: Normal heart sounds.  Pulmonary:     Effort: Pulmonary effort is normal.     Breath sounds: Normal breath sounds.  Abdominal:     General: Bowel sounds are normal.     Palpations: Abdomen is soft.  Musculoskeletal:     Cervical back: Normal range of motion.  Skin:    General: Skin is warm  and dry.     Capillary Refill: Capillary refill takes less than 2 seconds.  Neurological:     Mental Status: She is alert and oriented to person, place, and time.  Psychiatric:        Behavior: Behavior normal.      Musculoskeletal Exam: ***  CDAI Exam: CDAI Score: -- Patient Global: --; Provider Global: -- Swollen: --; Tender: -- Joint Exam 03/08/2022    No joint exam has been documented for this visit   There is currently no information documented on the homunculus. Go to the Rheumatology activity and complete the homunculus joint exam.  Investigation: No additional findings.  Imaging: No results found.  Recent Labs: Lab Results  Component Value Date   WBC 10.1 09/20/2021   HGB 15.4 (H) 09/20/2021   PLT 207 09/20/2021   NA 135 09/20/2021   K 3.2 (L) 09/20/2021   CL 95 (L) 09/20/2021   CO2 26 09/20/2021   GLUCOSE 247 (H) 09/20/2021   BUN 17 09/20/2021   CREATININE 0.71 09/20/2021   BILITOT 1.4 (H) 09/20/2021   ALKPHOS 82 09/20/2021   AST 54 (H) 09/20/2021   ALT 43 09/20/2021   PROT 8.1 09/20/2021   ALBUMIN 3.7 09/20/2021   CALCIUM 9.6 09/20/2021   GFRAA 96 07/01/2020    Speciality Comments: No specialty comments available.  Procedures:  No procedures performed Allergies: Plaquenil [hydroxychloroquine sulfate]   Assessment / Plan:     Visit Diagnoses: Other systemic lupus erythematosus with other organ involvement ([facility])  High risk medication use  Autoimmune hepatitis ([facility])  Primary osteoarthritis of both hands  Primary osteoarthritis of both knees  Primary osteoarthritis of both feet  Calcaneal spur of both feet  MGUS (monoclonal gammopathy of unknown significance)  Hyperuricemia  Vitamin D deficiency  Essential hypertension  Vasovagal syncope  Orders: No orders of the defined types were placed in this encounter.  No orders of the defined types were placed in this encounter.   Face-to-face time spent with patient was *** minutes. Greater than 50% of time was spent in counseling and coordination of care.  Follow-Up Instructions: No follow-ups on file.   Deanna Neas, PA-C  Note - This record has been created using Dragon software.  Chart creation errors have been sought, but may not always  have been located. Such creation errors do not reflect on  the standard of medical care.

## 2022-03-08 ENCOUNTER — Other Ambulatory Visit: Payer: Self-pay | Admitting: Nurse Practitioner

## 2022-03-08 ENCOUNTER — Ambulatory Visit: Payer: Medicare Other | Admitting: Physician Assistant

## 2022-03-08 DIAGNOSIS — R55 Syncope and collapse: Secondary | ICD-10-CM

## 2022-03-08 DIAGNOSIS — E559 Vitamin D deficiency, unspecified: Secondary | ICD-10-CM

## 2022-03-08 DIAGNOSIS — M7731 Calcaneal spur, right foot: Secondary | ICD-10-CM

## 2022-03-08 DIAGNOSIS — M19071 Primary osteoarthritis, right ankle and foot: Secondary | ICD-10-CM

## 2022-03-08 DIAGNOSIS — K754 Autoimmune hepatitis: Secondary | ICD-10-CM

## 2022-03-08 DIAGNOSIS — D472 Monoclonal gammopathy: Secondary | ICD-10-CM

## 2022-03-08 DIAGNOSIS — M3219 Other organ or system involvement in systemic lupus erythematosus: Secondary | ICD-10-CM

## 2022-03-08 DIAGNOSIS — Z1231 Encounter for screening mammogram for malignant neoplasm of breast: Secondary | ICD-10-CM

## 2022-03-08 DIAGNOSIS — M170 Bilateral primary osteoarthritis of knee: Secondary | ICD-10-CM

## 2022-03-08 DIAGNOSIS — I10 Essential (primary) hypertension: Secondary | ICD-10-CM

## 2022-03-08 DIAGNOSIS — Z79899 Other long term (current) drug therapy: Secondary | ICD-10-CM

## 2022-03-08 DIAGNOSIS — E790 Hyperuricemia without signs of inflammatory arthritis and tophaceous disease: Secondary | ICD-10-CM

## 2022-03-08 DIAGNOSIS — M19041 Primary osteoarthritis, right hand: Secondary | ICD-10-CM

## 2022-03-16 ENCOUNTER — Ambulatory Visit
Admission: RE | Admit: 2022-03-16 | Discharge: 2022-03-16 | Disposition: A | Discharge status: Home / Self Care | Payer: Medicare Other | Source: Ambulatory Visit | Attending: Nurse Practitioner | Admitting: Nurse Practitioner

## 2022-03-16 DIAGNOSIS — Z1231 Encounter for screening mammogram for malignant neoplasm of breast: Secondary | ICD-10-CM

## 2022-03-19 DIAGNOSIS — L282 Other prurigo: Secondary | ICD-10-CM | POA: Insufficient documentation

## 2022-06-29 DIAGNOSIS — L209 Atopic dermatitis, unspecified: Secondary | ICD-10-CM | POA: Insufficient documentation

## 2022-11-03 NOTE — Progress Notes (Signed)
Office Visit Note  Patient: Deanna Hart             Date of Birth: 04/13/1941           MRN: [id number]             PCP: Suella Broad, FNP Referring: Suella Broad,* Visit Date: 11/11/2022 Occupation: '@GUAROCC'$ @  Subjective:  Medication monitoring   History of Present Illness: Deanna Hart is a 81 y.o. female with history of systemic lupus erythematosus, autoimmune hepatitis, and osteoarthritis.  She remains on prednisone 20 mg daily prescribed by Dr. Benson Norway.  She was last seen in the office on 10/05/21.  She denies any signs or symptoms of a systemic lupus flare.  Patient reports she continues to have some intermittent discomfort in the left knee but denies any increased warmth or joint swelling.  She denies any recent falls. She states that she has fluid retention in her lower legs at times.  She denies any other joint pain or joint swelling at this time.  She denies any signs or symptoms of a gout flare.  Patient reports that she continues to follow-up with Dr. Benson Norway closely.  Her next follow-up visit with Dr. Benson Norway is on 01/11/2023. She denies any recent rashes, sicca symptoms, sores in her mouth or nose, Raynaud's phenomenon, swollen lymph nodes, or shortness of breath.  She has been trying to avoid direct sun exposure as advised.  Her energy level has been stable.    Activities of Daily Living:  Patient reports morning stiffness for 5 minutes.   Patient Reports nocturnal pain.  Difficulty dressing/grooming: Denies Difficulty climbing stairs: Reports Difficulty getting out of chair: Denies Difficulty using hands for taps, buttons, cutlery, and/or writing: Denies  Review of Systems  Constitutional:  Positive for fatigue.  HENT: Negative.  Negative for mouth sores and mouth dryness.   Eyes: Negative.  Negative for dryness.  Respiratory: Negative.  Negative for shortness of breath.   Cardiovascular: Negative.  Negative for chest pain and palpitations.   Gastrointestinal: Negative.  Negative for blood in stool, constipation and diarrhea.  Endocrine: Negative.  Negative for increased urination.  Genitourinary: Negative.  Negative for involuntary urination.  Musculoskeletal:  Positive for joint pain, gait problem, joint pain, joint swelling and morning stiffness. Negative for myalgias, muscle weakness, muscle tenderness and myalgias.  Skin:  Positive for rash. Negative for color change, hair loss and sensitivity to sunlight.  Allergic/Immunologic: Negative.  Negative for susceptible to infections.  Neurological:  Negative for dizziness and headaches.  Hematological: Negative.  Negative for swollen glands.  Psychiatric/Behavioral:  Positive for sleep disturbance. Negative for depressed mood. The patient is not nervous/anxious.     PMFS History:  Patient Active Problem List   Diagnosis Date Noted   MGUS (monoclonal gammopathy of unknown significance) 06/01/2017   Hyperuricemia 06/01/2017   Elevated LFTs 03/03/2017   Acute pain of right shoulder 11/28/2016   High risk medications (not anticoagulants) long-term use 07/19/2016   Rheumatoid factor positive 07/19/2016   SLE (systemic lupus erythematosus) ([place]) 07/18/2016   Primary osteoarthritis of both hands 07/18/2016   Primary osteoarthritis of both feet 07/18/2016   Calcaneal spur of both feet 07/18/2016   Osteoarthritis of both knees 07/18/2016   Hypertension 07/18/2016   Obesity 07/18/2016   Vitamin D deficiency 07/18/2016    Past Medical History:  Diagnosis Date   Calcaneal spur of both feet 07/18/2016   Gout    Hypertension 07/18/2016   Obesity 07/18/2016  Osteoarthritis of both feet 07/18/2016   Osteoarthritis of both hands 07/18/2016   Osteoarthritis of both knees 07/18/2016   Severe Right lateral, Left med.    SLE (systemic lupus erythematosus) ([place]) 07/18/2016   Positive ANA, Positive Ro, Positive Smith, Positive RNP, Positive RF, Negative CCP,  Elevated LFTs   Vitamin  D deficiency 07/18/2016    Family History  Problem Relation Age of Onset   Cancer Brother    Cancer Maternal Uncle    Past Surgical History:  Procedure Laterality Date   BREAST EXCISIONAL BIOPSY Left    BREAST SURGERY     EYE SURGERY Bilateral 07/2017   cataracts   Social History   Social History Narrative   Not on file   Immunization History  Administered Date(s) Administered   PFIZER(Purple Top)SARS-COV-2 Vaccination 12/19/2019, 01/13/2020, 09/30/2020     Objective: Vital Signs: BP 136/85 (BP Location: Left Arm, Patient Position: Sitting, Cuff Size: Large)   Pulse 77   Resp 16   Ht '5\' 4"'$  (1.626 m)   Wt 209 lb (94.8 kg)   BMI 35.87 kg/m    Physical Exam Vitals and nursing note reviewed.  Constitutional:      Appearance: She is well-developed.  HENT:     Head: Normocephalic and atraumatic.  Eyes:     Conjunctiva/sclera: Conjunctivae normal.  Cardiovascular:     Rate and Rhythm: Normal rate and regular rhythm.     Heart sounds: Normal heart sounds.  Pulmonary:     Effort: Pulmonary effort is normal.     Breath sounds: Normal breath sounds.  Abdominal:     General: Bowel sounds are normal.     Palpations: Abdomen is soft.  Musculoskeletal:     Cervical back: Normal range of motion.  Skin:    General: Skin is warm and dry.     Capillary Refill: Capillary refill takes less than 2 seconds.  Neurological:     Mental Status: She is alert and oriented to person, place, and time.  Psychiatric:        Behavior: Behavior normal.      Musculoskeletal Exam: C-spine has good range of motion.  Thoracic kyphosis noted.  No midline spinal tenderness.  Shoulder joints, elbow joints, and wrist joints have good range of motion.  Some synovial thickening of the wrist joints without tenderness or synovitis.  No tenderness or synovitis over the MCP joints.  Some PIP and DIP thickening consistent with osteoarthritis of both hands.  Complete fist formation bilaterally.  Hip  joints have good range of motion with no groin pain.  Some discomfort in the left knee with full flexion.  Ankle joints have good range of motion.  Pedal edema noted bilaterally.  No tenderness or synovitis over MTP joints.  CDAI Exam: CDAI Score: -- Patient Global: --; Provider Global: -- Swollen: --; Tender: -- Joint Exam 11/11/2022   No joint exam has been documented for this visit   There is currently no information documented on the homunculus. Go to the Rheumatology activity and complete the homunculus joint exam.  Investigation: No additional findings.  Imaging: No results found.  Recent Labs: Lab Results  Component Value Date   WBC 10.1 09/20/2021   HGB 15.4 (H) 09/20/2021   PLT 207 09/20/2021   NA 135 09/20/2021   K 3.2 (L) 09/20/2021   CL 95 (L) 09/20/2021   CO2 26 09/20/2021   GLUCOSE 247 (H) 09/20/2021   BUN 17 09/20/2021   CREATININE 0.71 09/20/2021  BILITOT 1.4 (H) 09/20/2021   ALKPHOS 82 09/20/2021   AST 54 (H) 09/20/2021   ALT 43 09/20/2021   PROT 8.1 09/20/2021   ALBUMIN 3.7 09/20/2021   CALCIUM 9.6 09/20/2021   GFRAA 96 07/01/2020    Speciality Comments: No specialty comments available.  Procedures:  No procedures performed Allergies: Plaquenil [hydroxychloroquine sulfate]   Assessment / Plan:     Visit Diagnoses: Other systemic lupus erythematosus with other organ involvement ([place]) - +ANA +Ro +Smith +RNP + RF neg CCP / h/o skin biopsy + by Dr Ronnald Ramp.  Previously had intolerance to Plaquenil and Imuran: Patient was last seen in the office on 10/05/2021.  She denies any signs or symptoms of a systemic lupus flare.  She remains on prednisone 20 mg daily prescribed by Dr. Benson Norway for management of autoimmune hepatitis.  She continues to follow-up closely with Dr. Benson Norway. No Malar rash noted.  No cervical lymphadenopathy.  She has not had any symptoms of Raynaud's phenomenon.  No oral or nasal ulcerations.  She has not had any increased sicca symptoms.  Her  energy level has been stable overall.  She has been trying to avoid direct sun exposure as advised. She experiences intermittent discomfort in the left knee due to underlying osteoarthritis.  She had no effusion or warmth on examination today.  She is not experiencing other joint pain or joint swelling at this time.  No synovitis was noted.  The following lab work will be obtained today for further evaluation.  She will remain on the current dose of Prednisone as prescribed by Dr. Benson Norway. She was advised to notify us if she develops signs or symptoms of a flare.  She will follow-up in the office in 5 months or sooner if needed. - Plan: Protein / creatinine ratio, urine, CBC with Differential/Platelet, COMPLETE METABOLIC PANEL WITH GFR, ANA, Anti-DNA antibody, double-stranded, Sedimentation rate, C3 and C4, VITAMIN D 25 Hydroxy (Vit-D Deficiency, Fractures), Sjogrens syndrome-A extractable nuclear antibody, Serum protein electrophoresis with reflex  High risk medication use - Prednisone 20 mg po daily prescribed by Dr. Benson Norway.  She could not tolerate Imuran or Plaquenil due to GI SE.  CBC and CMP updated today.  - Plan: CBC with Differential/Platelet, COMPLETE METABOLIC PANEL WITH GFR  Autoimmune hepatitis ([place]) - Dr. Benson Norway. Her next follow-up visit is scheduled on 01/11/2023 per patient.  She remains on prednisone 20 mg daily prescribed by Dr. Benson Norway.  CMP updated today.  - Plan: COMPLETE METABOLIC PANEL WITH GFR  Primary osteoarthritis of both hands: She has PIP and DIP thickening consistent with osteoarthritis of both hands.  No synovitis noted.  Complete fist formation bilaterally.  Discussed the importance of joint protection and muscle strengthening.  Primary osteoarthritis of both knees: She experiences intermittent discomfort in the left knee.  She has some discomfort with full flexion of the left knee today.  No warmth noted.  Right knee has good range of motion with no warmth or effusion.  Discussed  the importance of lower extremity muscle strengthening. She has not had any recent falls.  Primary osteoarthritis of both feet: Pedal edema noted bilaterally.  No tenderness over the ankle joints.  No tenderness or synovitis over MTP joints.  She is wearing proper fitting shoes.  Calcaneal spur of both feet: She is not experiencing any discomfort in her feet at this time.  She is wearing proper fitting shoes with support.  MGUS (monoclonal gammopathy of unknown significance) - Followed by hematology. SPEP updated today.  Hyperuricemia: She has not had any signs or symptoms of a gout flare.  Vitamin D deficiency -Vitamin D will be checked today.  Plan: VITAMIN D 25 Hydroxy (Vit-D Deficiency, Fractures)  Essential hypertension: Blood pressure was 136/85 today in the office.  Vasovagal syncope-09/20/21-evaluated at Urgent Care.   Orders: Orders Placed This Encounter  Procedures   Protein / creatinine ratio, urine   CBC with Differential/Platelet   COMPLETE METABOLIC PANEL WITH GFR   ANA   Anti-DNA antibody, double-stranded   Sedimentation rate   C3 and C4   VITAMIN D 25 Hydroxy (Vit-D Deficiency, Fractures)   Sjogrens syndrome-A extractable nuclear antibody   Serum protein electrophoresis with reflex   No orders of the defined types were placed in this encounter.     Follow-Up Instructions: Return in about 5 months (around 04/11/2023) for Systemic lupus erythematosus.   [name], PA-C  Note - This record has been created using Dragon software.  Chart creation errors have been sought, but may not always  have been located. Such creation errors do not reflect on  the standard of medical care.

## 2022-11-11 ENCOUNTER — Encounter: Payer: Self-pay | Admitting: Physician Assistant

## 2022-11-11 ENCOUNTER — Ambulatory Visit: Payer: 59 | Attending: Physician Assistant | Admitting: Physician Assistant

## 2022-11-11 VITALS — BP 136/85 | HR 77 | Resp 16 | Ht 64.0 in | Wt 209.0 lb

## 2022-11-11 DIAGNOSIS — M19071 Primary osteoarthritis, right ankle and foot: Secondary | ICD-10-CM

## 2022-11-11 DIAGNOSIS — M19042 Primary osteoarthritis, left hand: Secondary | ICD-10-CM

## 2022-11-11 DIAGNOSIS — M170 Bilateral primary osteoarthritis of knee: Secondary | ICD-10-CM

## 2022-11-11 DIAGNOSIS — R55 Syncope and collapse: Secondary | ICD-10-CM

## 2022-11-11 DIAGNOSIS — K754 Autoimmune hepatitis: Secondary | ICD-10-CM

## 2022-11-11 DIAGNOSIS — D472 Monoclonal gammopathy: Secondary | ICD-10-CM

## 2022-11-11 DIAGNOSIS — Z79899 Other long term (current) drug therapy: Secondary | ICD-10-CM | POA: Diagnosis not present

## 2022-11-11 DIAGNOSIS — M19072 Primary osteoarthritis, left ankle and foot: Secondary | ICD-10-CM

## 2022-11-11 DIAGNOSIS — M7731 Calcaneal spur, right foot: Secondary | ICD-10-CM

## 2022-11-11 DIAGNOSIS — M19041 Primary osteoarthritis, right hand: Secondary | ICD-10-CM

## 2022-11-11 DIAGNOSIS — M3219 Other organ or system involvement in systemic lupus erythematosus: Secondary | ICD-10-CM | POA: Diagnosis not present

## 2022-11-11 DIAGNOSIS — E790 Hyperuricemia without signs of inflammatory arthritis and tophaceous disease: Secondary | ICD-10-CM

## 2022-11-11 DIAGNOSIS — I10 Essential (primary) hypertension: Secondary | ICD-10-CM

## 2022-11-11 DIAGNOSIS — E559 Vitamin D deficiency, unspecified: Secondary | ICD-10-CM

## 2022-11-11 DIAGNOSIS — M7732 Calcaneal spur, left foot: Secondary | ICD-10-CM

## 2022-11-14 ENCOUNTER — Other Ambulatory Visit: Payer: Self-pay | Admitting: *Deleted

## 2022-11-14 DIAGNOSIS — E559 Vitamin D deficiency, unspecified: Secondary | ICD-10-CM

## 2022-11-14 DIAGNOSIS — R899 Unspecified abnormal finding in specimens from other organs, systems and tissues: Secondary | ICD-10-CM

## 2022-11-14 MED ORDER — VITAMIN D (ERGOCALCIFEROL) 1.25 MG (50000 UNIT) PO CAPS: 50000.0000 [IU] | ORAL_CAPSULE | ORAL | 0 refills | Status: DC

## 2022-11-14 NOTE — Progress Notes (Signed)
Ro antibody remains positive.  CBC stable.  Glucose is 155. Potassium remains borderline low but has improved.  LFTs WNL. Please forward lab work to Dr. Benson Norway as requested.  Vitamin D is low-28.  Please notify the patient and send in vitamin D 50,000 units once weekly x3 months.  Recheck vitamin D in 3 months.  dsDNA is negative. Complements are WNL.  ESR WNL.  Labs are not consistent with a lupus flare currently.   Protein creatinine ratio is borderline elevated.  Please clarify if she has a nephrologist?

## 2022-11-16 LAB — CBC WITH DIFFERENTIAL/PLATELET
Absolute Monocytes: 918 cells/uL (ref 200–950)
Basophils Absolute: 37 cells/uL (ref 0–200)
Basophils Relative: 0.5 %
Eosinophils Absolute: 37 cells/uL (ref 15–500)
Eosinophils Relative: 0.5 %
HCT: 46.1 % — ABNORMAL HIGH (ref 35.0–45.0)
Hemoglobin: 15.6 g/dL — ABNORMAL HIGH (ref 11.7–15.5)
Lymphs Abs: 1754 cells/uL (ref 850–3900)
MCH: 31.7 pg (ref 27.0–33.0)
MCHC: 33.8 g/dL (ref 32.0–36.0)
MCV: 93.7 fL (ref 80.0–100.0)
MPV: 12.5 fL (ref 7.5–12.5)
Monocytes Relative: 12.4 %
Neutro Abs: 4655 cells/uL (ref 1500–7800)
Neutrophils Relative %: 62.9 %
Platelets: 194 10*3/uL (ref 140–400)
RBC: 4.92 10*6/uL (ref 3.80–5.10)
RDW: 12.6 % (ref 11.0–15.0)
Total Lymphocyte: 23.7 %
WBC: 7.4 10*3/uL (ref 3.8–10.8)

## 2022-11-16 LAB — COMPLETE METABOLIC PANEL WITH GFR
AG Ratio: 1.4 (calc) (ref 1.0–2.5)
ALT: 27 U/L (ref 6–29)
AST: 29 U/L (ref 10–35)
Albumin: 4.1 g/dL (ref 3.6–5.1)
Alkaline phosphatase (APISO): 87 U/L (ref 37–153)
BUN: 10 mg/dL (ref 7–25)
CO2: 27 mmol/L (ref 20–32)
Calcium: 9.3 mg/dL (ref 8.6–10.4)
Chloride: 103 mmol/L (ref 98–110)
Creat: 0.62 mg/dL (ref 0.60–0.95)
Globulin: 2.9 g/dL (calc) (ref 1.9–3.7)
Glucose, Bld: 155 mg/dL — ABNORMAL HIGH (ref 65–99)
Potassium: 3.4 mmol/L — ABNORMAL LOW (ref 3.5–5.3)
Sodium: 142 mmol/L (ref 135–146)
Total Bilirubin: 0.8 mg/dL (ref 0.2–1.2)
Total Protein: 7 g/dL (ref 6.1–8.1)
eGFR: 89 mL/min/{1.73_m2} (ref >=60)

## 2022-11-16 LAB — PROTEIN / CREATININE RATIO, URINE
Creatinine, Urine: 41 mg/dL (ref 20–275)
Protein/Creat Ratio: 390 mg/g creat — ABNORMAL HIGH (ref 24–184)
Protein/Creatinine Ratio: 0.39 mg/mg creat — ABNORMAL HIGH (ref 0.024–0.184)
Total Protein, Urine: 16 mg/dL (ref 5–24)

## 2022-11-16 LAB — SEDIMENTATION RATE: Sed Rate: 6 mm/h (ref 0–30)

## 2022-11-16 LAB — PROTEIN ELECTROPHORESIS, SERUM, WITH REFLEX
Albumin ELP: 4.1 g/dL (ref 3.8–4.8)
Alpha 1: 0.2 g/dL (ref 0.2–0.3)
Alpha 2: 0.8 g/dL (ref 0.5–0.9)
Beta 2: 0.4 g/dL (ref 0.2–0.5)
Beta Globulin: 0.4 g/dL (ref 0.4–0.6)
Gamma Globulin: 1 g/dL (ref 0.8–1.7)
Total Protein: 7 g/dL (ref 6.1–8.1)

## 2022-11-16 LAB — C3 AND C4
C3 Complement: 174 mg/dL
C4 Complement: 38 mg/dL

## 2022-11-16 LAB — VITAMIN D 25 HYDROXY (VIT D DEFICIENCY, FRACTURES): Vit D, 25-Hydroxy: 28 ng/mL — ABNORMAL LOW (ref 30–100)

## 2022-11-16 LAB — ANA: Anti Nuclear Antibody (ANA): POSITIVE — AB

## 2022-11-16 LAB — ANTI-NUCLEAR AB-TITER (ANA TITER): ANA Titer 1: 1:80 {titer} — ABNORMAL HIGH

## 2022-11-16 LAB — ANTI-DNA ANTIBODY, DOUBLE-STRANDED: ds DNA Ab: 2 IU/mL

## 2022-11-16 LAB — SJOGRENS SYNDROME-A EXTRACTABLE NUCLEAR ANTIBODY: SSA (Ro) (ENA) Antibody, IgG: 4.8 AI — AB

## 2022-11-16 NOTE — Progress Notes (Signed)
SPEP no abnormal protein bands.

## 2023-02-09 ENCOUNTER — Telehealth: Payer: Self-pay | Admitting: Rheumatology

## 2023-02-09 NOTE — Telephone Encounter (Signed)
Patient left a voicemail concerning a medication that Ladona Ridgel prescribed for her.  Patient states "she can't take it until she knows what it is for."  Patient requested a return call.

## 2023-02-09 NOTE — Telephone Encounter (Signed)
Patient called wanting to discuss the vitamin D 50,000 units prescription that was ready at the pharmacy. I advised patient we have not sent an rx for vitamin D since 11/14/2022. Patient was insistent that we sent another prescription. I called the pharmacy and the pharmacist states there was a system error and the rx they had ready for the patient was actually invalid due to being a duplicate from the February prescription. I advised the pharmacist that we are not refilling it at this time.   The patient states she took 1 capsule daily for 12 days back in February/March instead of taking 1 capsule weekly. Patient has not returned to have vitamin D re-checked. Patient will come to the office in the morning to have vitamin d level checked. Order is in place. Dr. Corliss Skains and Sherron Ales, PA-C are aware.

## 2023-02-09 NOTE — Telephone Encounter (Signed)
Attempted to contact patient and left message to advise patient to call the office.  

## 2023-02-10 ENCOUNTER — Other Ambulatory Visit: Payer: Self-pay

## 2023-02-10 DIAGNOSIS — E559 Vitamin D deficiency, unspecified: Secondary | ICD-10-CM

## 2023-02-11 LAB — VITAMIN D 25 HYDROXY (VIT D DEFICIENCY, FRACTURES): Vit D, 25-Hydroxy: 40 ng/mL (ref 30–100)

## 2023-02-14 NOTE — Progress Notes (Signed)
Vitamin D WNL.  Continue OTC maintenance dose of vitamin D

## 2023-03-28 NOTE — Progress Notes (Deleted)
Office Visit Note  Patient: Deanna Hart             Date of Birth: May 01, 1941           MRN: [id number]             PCP: Deanna Amos, FNP Referring: Deanna Hart,* Visit Date: 04/11/2023 Occupation: @GUAROCC @  Subjective:    History of Present Illness: Deanna Hart is a 82 y.o. female with history of systemic lupus and autoimmune hepatitis.  Patient remains on prednisone 20 mg by mouth daily.   Lab work from 11/11/22: ANA 1:80 cytoplasmic, dsDNA negative, complements WNL, ESR, Ro antibody positive, no proteinura   CBC and CMP updated on 11/11/22.  Orders for CBC and CMP released today.   Activities of Daily Living:  Patient reports morning stiffness for *** {minute/hour:19697}.   Patient {ACTIONS;DENIES/REPORTS:21021675::"Denies"} nocturnal pain.  Difficulty dressing/grooming: {ACTIONS;DENIES/REPORTS:21021675::"Denies"} Difficulty climbing stairs: {ACTIONS;DENIES/REPORTS:21021675::"Denies"} Difficulty getting out of chair: {ACTIONS;DENIES/REPORTS:21021675::"Denies"} Difficulty using hands for taps, buttons, cutlery, and/or writing: {ACTIONS;DENIES/REPORTS:21021675::"Denies"}  No Rheumatology ROS completed.   PMFS History:  Patient Active Problem List   Diagnosis Date Noted   MGUS (monoclonal gammopathy of unknown significance) 06/01/2017   Hyperuricemia 06/01/2017   Elevated LFTs 03/03/2017   Acute pain of right shoulder 11/28/2016   High risk medications (not anticoagulants) long-term use 07/19/2016   Rheumatoid factor positive 07/19/2016   SLE (systemic lupus erythematosus) ([facility]) 07/18/2016   Primary osteoarthritis of both hands 07/18/2016   Primary osteoarthritis of both feet 07/18/2016   Calcaneal spur of both feet 07/18/2016   Osteoarthritis of both knees 07/18/2016   Hypertension 07/18/2016   Obesity 07/18/2016   Vitamin D deficiency 07/18/2016    Past Medical History:  Diagnosis Date   Calcaneal spur of both feet 07/18/2016    Gout    Hypertension 07/18/2016   Obesity 07/18/2016   Osteoarthritis of both feet 07/18/2016   Osteoarthritis of both hands 07/18/2016   Osteoarthritis of both knees 07/18/2016   Severe Right lateral, Left med.    SLE (systemic lupus erythematosus) ([facility]) 07/18/2016   Positive ANA, Positive Ro, Positive Smith, Positive RNP, Positive RF, Negative CCP,  Elevated LFTs   Vitamin D deficiency 07/18/2016    Family History  Problem Relation Age of Onset   Cancer Brother    Cancer Maternal Uncle    Past Surgical History:  Procedure Laterality Date   BREAST EXCISIONAL BIOPSY Left    BREAST SURGERY     EYE SURGERY Bilateral 07/2017   cataracts   Social History   Social History Narrative   Not on file   Immunization History  Administered Date(s) Administered   PFIZER(Purple Top)SARS-COV-2 Vaccination 12/19/2019, 01/13/2020, 09/30/2020     Objective: Vital Signs: There were no vitals taken for this visit.   Physical Exam Vitals and nursing note reviewed.  Constitutional:      Appearance: She is well-developed.  HENT:     Head: Normocephalic and atraumatic.  Eyes:     Conjunctiva/sclera: Conjunctivae normal.  Cardiovascular:     Rate and Rhythm: Normal rate and regular rhythm.     Heart sounds: Normal heart sounds.  Pulmonary:     Effort: Pulmonary effort is normal.     Breath sounds: Normal breath sounds.  Abdominal:     General: Bowel sounds are normal.     Palpations: Abdomen is soft.  Musculoskeletal:     Cervical back: Normal range of motion.  Lymphadenopathy:     Cervical: No  cervical adenopathy.  Skin:    General: Skin is warm and dry.     Capillary Refill: Capillary refill takes less than 2 seconds.  Neurological:     Mental Status: She is alert and oriented to person, place, and time.  Psychiatric:        Behavior: Behavior normal.      Musculoskeletal Exam: ***  CDAI Exam: CDAI Score: -- Patient Global: --; Provider Global: -- Swollen: --; Tender:  -- Joint Exam 04/11/2023   No joint exam has been documented for this visit   There is currently no information documented on the homunculus. Go to the Rheumatology activity and complete the homunculus joint exam.  Investigation: No additional findings.  Imaging: No results found.  Recent Labs: Lab Results  Component Value Date   WBC 7.4 11/11/2022   HGB 15.6 (H) 11/11/2022   PLT 194 11/11/2022   NA 142 11/11/2022   K 3.4 (L) 11/11/2022   CL 103 11/11/2022   CO2 27 11/11/2022   GLUCOSE 155 (H) 11/11/2022   BUN 10 11/11/2022   CREATININE 0.62 11/11/2022   BILITOT 0.8 11/11/2022   ALKPHOS 82 09/20/2021   AST 29 11/11/2022   ALT 27 11/11/2022   PROT 7.0 11/11/2022   PROT 7.0 11/11/2022   ALBUMIN 3.7 09/20/2021   CALCIUM 9.3 11/11/2022   GFRAA 96 07/01/2020    Speciality Comments: No specialty comments available.  Procedures:  No procedures performed Allergies: Plaquenil [hydroxychloroquine sulfate]   Assessment / Plan:     Visit Diagnoses: Other systemic lupus erythematosus with other organ involvement ([facility])  High risk medication use  Autoimmune hepatitis ([facility])  Primary osteoarthritis of both hands  Primary osteoarthritis of both knees  Primary osteoarthritis of both feet  Calcaneal spur of both feet  MGUS (monoclonal gammopathy of unknown significance)  Hyperuricemia  Essential hypertension  Vasovagal syncope  Vitamin D deficiency  Orders: No orders of the defined types were placed in this encounter.  No orders of the defined types were placed in this encounter.   Face-to-face time spent with patient was *** minutes. Greater than 50% of time was spent in counseling and coordination of care.  Follow-Up Instructions: No follow-ups on file.   Gearldine Bienenstock, PA-C  Note - This record has been created using Dragon software.  Chart creation errors have been sought, but may not always  have been located. Such creation errors do not reflect on   the standard of medical care.

## 2023-04-11 ENCOUNTER — Ambulatory Visit: Payer: 59 | Admitting: Physician Assistant

## 2023-04-11 DIAGNOSIS — K754 Autoimmune hepatitis: Secondary | ICD-10-CM

## 2023-04-11 DIAGNOSIS — M19071 Primary osteoarthritis, right ankle and foot: Secondary | ICD-10-CM

## 2023-04-11 DIAGNOSIS — E790 Hyperuricemia without signs of inflammatory arthritis and tophaceous disease: Secondary | ICD-10-CM

## 2023-04-11 DIAGNOSIS — E559 Vitamin D deficiency, unspecified: Secondary | ICD-10-CM

## 2023-04-11 DIAGNOSIS — I10 Essential (primary) hypertension: Secondary | ICD-10-CM

## 2023-04-11 DIAGNOSIS — M3219 Other organ or system involvement in systemic lupus erythematosus: Secondary | ICD-10-CM

## 2023-04-11 DIAGNOSIS — R55 Syncope and collapse: Secondary | ICD-10-CM

## 2023-04-11 DIAGNOSIS — Z79899 Other long term (current) drug therapy: Secondary | ICD-10-CM

## 2023-04-11 DIAGNOSIS — M19041 Primary osteoarthritis, right hand: Secondary | ICD-10-CM

## 2023-04-11 DIAGNOSIS — D472 Monoclonal gammopathy: Secondary | ICD-10-CM

## 2023-04-11 DIAGNOSIS — M170 Bilateral primary osteoarthritis of knee: Secondary | ICD-10-CM

## 2023-04-11 DIAGNOSIS — M7731 Calcaneal spur, right foot: Secondary | ICD-10-CM

## 2023-04-11 NOTE — Progress Notes (Unsigned)
Office Visit Note  Patient: Deanna Hart             Date of Birth: 1941-06-07           MRN: [id number]             PCP: Maryagnes Amos, FNP Referring: Maryagnes Amos,* Visit Date: 04/12/2023 Occupation: @GUAROCC @  Subjective:  Lower back pain-muscle spasms   History of Present Illness: Deanna Hart is a 82 y.o. female with history of systemic lupus and autoimmune hepatitis.  Patient remains on prednisone 20 mg by mouth daily.  She continues to tolerate prednisone without any side effects other than weight gain.  She denies any signs or symptoms of a systemic lupus flare.  She denies any sores in her mouth or nose, recent rashes, Raynaud's phenomenon, swollen lymph nodes, or joint swelling.  According to the patient last Thursday she had a fall when trying to sit in a plastic chair on her porch.  She states that she landed on her buttocks.  She states that her symptoms have slowly been improving but she continues to have muscle spasms in her lower back.  She has tried taking Tylenol and has also used a pain patch.  She has been using a cane to assist with ambulation.  She continues to have intermittent discomfort in the left knee joint.  She notices increased fluid retention in both lower legs as the day goes on.     Activities of Daily Living:  Patient reports morning stiffness for 0  none .   Patient Denies nocturnal pain.  Difficulty dressing/grooming: Denies Difficulty climbing stairs: Reports Difficulty getting out of chair: Reports Difficulty using hands for taps, buttons, cutlery, and/or writing: Reports  Review of Systems  Constitutional:  Negative for fatigue.  HENT:  Positive for mouth dryness. Negative for mouth sores.   Eyes:  Negative for dryness.  Respiratory:  Negative for shortness of breath.   Cardiovascular:  Negative for chest pain and palpitations.  Gastrointestinal:  Negative for blood in stool, constipation and diarrhea.  Endocrine:  Negative for increased urination.  Genitourinary:  Negative for involuntary urination.  Musculoskeletal:  Positive for joint pain, gait problem, joint pain and joint swelling. Negative for myalgias, muscle weakness, morning stiffness, muscle tenderness and myalgias.  Skin:  Positive for rash and hair loss. Negative for color change and sensitivity to sunlight.  Allergic/Immunologic: Negative for susceptible to infections.  Neurological:  Negative for dizziness and headaches.  Hematological:  Negative for swollen glands.  Psychiatric/Behavioral:  Positive for sleep disturbance. Negative for depressed mood. The patient is not nervous/anxious.     PMFS History:  Patient Active Problem List   Diagnosis Date Noted   MGUS (monoclonal gammopathy of unknown significance) 06/01/2017   Hyperuricemia 06/01/2017   Elevated LFTs 03/03/2017   Acute pain of right shoulder 11/28/2016   High risk medications (not anticoagulants) long-term use 07/19/2016   Rheumatoid factor positive 07/19/2016   SLE (systemic lupus erythematosus) (HCC) 07/18/2016   Primary osteoarthritis of both hands 07/18/2016   Primary osteoarthritis of both feet 07/18/2016   Calcaneal spur of both feet 07/18/2016   Osteoarthritis of both knees 07/18/2016   Hypertension 07/18/2016   Obesity 07/18/2016   Vitamin D deficiency 07/18/2016    Past Medical History:  Diagnosis Date   Calcaneal spur of both feet 07/18/2016   Gout    Hypertension 07/18/2016   Obesity 07/18/2016   Osteoarthritis of both feet 07/18/2016   Osteoarthritis  of both hands 07/18/2016   Osteoarthritis of both knees 07/18/2016   Severe Right lateral, Left med.    SLE (systemic lupus erythematosus) (HCC) 07/18/2016   Positive ANA, Positive Ro, Positive Smith, Positive RNP, Positive RF, Negative CCP,  Elevated LFTs   Vitamin D deficiency 07/18/2016    Family History  Problem Relation Age of Onset   Cancer Brother    Cancer Maternal Uncle    Past Surgical  History:  Procedure Laterality Date   BREAST EXCISIONAL BIOPSY Left    BREAST SURGERY     EYE SURGERY Bilateral 07/2017   cataracts   Social History   Social History Narrative   Not on file   Immunization History  Administered Date(s) Administered   PFIZER(Purple Top)SARS-COV-2 Vaccination 12/19/2019, 01/13/2020, 09/30/2020     Objective: Vital Signs: BP 125/89 (BP Location: Left Arm, Patient Position: Sitting, Cuff Size: Normal)   Pulse 96   Resp 17   Ht 5\' 4"  (1.626 m)   Wt 208 lb (94.3 kg)   BMI 35.70 kg/m    Physical Exam Vitals and nursing note reviewed.  Constitutional:      Appearance: She is well-developed.  HENT:     Head: Normocephalic and atraumatic.  Eyes:     Conjunctiva/sclera: Conjunctivae normal.  Cardiovascular:     Rate and Rhythm: Normal rate and regular rhythm.     Heart sounds: Normal heart sounds.  Pulmonary:     Effort: Pulmonary effort is normal.     Breath sounds: Normal breath sounds.  Abdominal:     General: Bowel sounds are normal.     Palpations: Abdomen is soft.  Musculoskeletal:     Cervical back: Normal range of motion.  Lymphadenopathy:     Cervical: No cervical adenopathy.  Skin:    General: Skin is warm and dry.     Capillary Refill: Capillary refill takes less than 2 seconds.  Neurological:     Mental Status: She is alert and oriented to person, place, and time.  Psychiatric:        Behavior: Behavior normal.      Musculoskeletal Exam: C-spine has good range of motion.  Painful range of motion of the lumbar spine.  Muscle spasms in the lower lumbar region in the paraspinal muscles noted.  Some midline spinal tenderness in the lumbar region noted.  Shoulder joints, elbow joints, wrist joints, MCPs, PIPs, DIPs have good range of motion with no synovitis.  Complete fist formation bilaterally.  PIP and DIP thickening noted.  Hip joints have good range of motion with no groin pain.  Limited extension of the left knee with warmth.   Pedal edema noted in bilateral feet and lower legs.  CDAI Exam: CDAI Score: -- Patient Global: --; Provider Global: -- Swollen: --; Tender: -- Joint Exam 04/12/2023   No joint exam has been documented for this visit   There is currently no information documented on the homunculus. Go to the Rheumatology activity and complete the homunculus joint exam.  Investigation: No additional findings.  Imaging: No results found.  Recent Labs: Lab Results  Component Value Date   WBC 7.4 11/11/2022   HGB 15.6 (H) 11/11/2022   PLT 194 11/11/2022   NA 142 11/11/2022   K 3.4 (L) 11/11/2022   CL 103 11/11/2022   CO2 27 11/11/2022   GLUCOSE 155 (H) 11/11/2022   BUN 10 11/11/2022   CREATININE 0.62 11/11/2022   BILITOT 0.8 11/11/2022   ALKPHOS 82 09/20/2021  AST 29 11/11/2022   ALT 27 11/11/2022   PROT 7.0 11/11/2022   PROT 7.0 11/11/2022   ALBUMIN 3.7 09/20/2021   CALCIUM 9.3 11/11/2022   GFRAA 96 07/01/2020    Speciality Comments: No specialty comments available.  Procedures:  No procedures performed Allergies: Plaquenil [hydroxychloroquine sulfate]       Assessment / Plan:     Visit Diagnoses: Other systemic lupus erythematosus with other organ involvement (HCC) - +ANA +Ro +Smith +RNP + RF neg CCP / h/o skin biopsy + by Dr Yetta Barre.  Previously had intolerance to Plaquenil and Imuran: She has not had any signs or symptoms of a systemic lupus flare.  She has clinically been doing well taking prednisone 20 mg daily.  She has no synovitis on examination today.  She has not had any oral or nasal ulcerations.  She has occasional mild mouth dryness but no eye dryness.  She has not had any symptoms of Raynaud's phenomenon.  No recent rashes or hair loss. Lab work from 11/11/22: ANA 1:80 cytoplasmic, dsDNA negative, complements WNL, ESR, Ro antibody positive, no proteinuria.  The following lab work will be obtained today for further evaluation.  She will remain on prednisone 20 mg daily as  prescribed by Dr. Elnoria Howard.  She is aware of the risks of long-term prednisone use.  She was advised to notify us if she develops signs or symptoms of a flare. - Plan: CBC with Differential/Platelet, COMPLETE METABOLIC PANEL WITH GFR, Protein / creatinine ratio, urine, Anti-DNA antibody, double-stranded, C3 and C4, Sedimentation rate  High risk medication use - Prednisone 20 mg po daily prescribed by Dr. Elnoria Howard. Patient is aware of the risk of long term prednisone use.  CBC and CMP updated on 11/11/22.  Orders for CBC and CMP released today.  - Plan: CBC with Differential/Platelet, COMPLETE METABOLIC PANEL WITH GFR  Autoimmune hepatitis (HCC) - Dr. Elnoria Howard.  Patient remains on prednisone 20 mg daily.  CMP updated today.  Primary osteoarthritis of both hands: PIP and DIP thickening consistent with osteoarthritis of both hands.  No synovitis or tenderness noted.  Primary osteoarthritis of both knees: Limited extension and warmth noted in the left knee.  She uses a cane to assist with ambulation.  Primary osteoarthritis of both feet: She is not currently experiencing any increased discomfort in her feet.  Pedal edema noted in bilateral lower legs.  Discussed the use of compression.  Calcaneal spur of both feet: Asymptomatic currently.  Muscle spasm: Patient presents today experiencing muscle spasms in her lower lumbar region provoked by a fall on Thursday. Patient declined x-rays today for further evaluation. Discussed the use of a heating pad.  A Salonpas patch was applied to the patient's lower back today while she was in the office and she was also given a sample of a Salonpas patch to use if helpful.  A prescription for methocarbamol 500 mg 1 tablet daily as needed for muscle spasms was sent to the pharmacy today.  Instructions were provided.  Acute bilateral low back pain without sciatica: Patient had a fall last Thursday when trying to sit in a plastic chair on her patio.  Patient states that she did not  sit far enough back in the chair and fell on her buttocks.  She has been experiencing increased discomfort in her lower back since then.  Her symptoms have gradually started to improve but she has been having muscle spasms.  Patient declined x-rays today.  Different treatment options were discussed.  She  was given a short course of methocarbamol 500 mg 1 tablet daily as needed for muscle spasms.  Possible side effects were discussed. She will notify us if her symptoms persist or worsen.  Other medical conditions are listed as follows:  MGUS (monoclonal gammopathy of unknown significance) - Followed by hematology.  Hyperuricemia: No signs or symptoms of a gout flare.  Vitamin D deficiency: Vitamin D was 40 on 02/10/2023.  She is taking vitamin D 2000 units daily.  Essential hypertension: Blood pressure was 125/89 today in the office.  Vasovagal syncope - 09/20/21-evaluated at Urgent Care. Orders: Orders Placed This Encounter  Procedures   CBC with Differential/Platelet   COMPLETE METABOLIC PANEL WITH GFR   Protein / creatinine ratio, urine   Anti-DNA antibody, double-stranded   C3 and C4   Sedimentation rate   Meds ordered this encounter  Medications   methocarbamol (ROBAXIN) 500 MG tablet    Sig: Take 1 tablet (500 mg total) by mouth daily as needed.    Dispense:  7 tablet    Refill:  0    Follow-Up Instructions: No follow-ups on file.   Gearldine Bienenstock, PA-C  Note - This record has been created using Dragon software.  Chart creation errors have been sought, but may not always  have been located. Such creation errors do not reflect on  the standard of medical care.

## 2023-04-12 ENCOUNTER — Encounter: Payer: Self-pay | Admitting: Physician Assistant

## 2023-04-12 ENCOUNTER — Ambulatory Visit: Payer: 59 | Attending: Physician Assistant | Admitting: Physician Assistant

## 2023-04-12 VITALS — BP 125/89 | HR 96 | Resp 17 | Ht 64.0 in | Wt 208.0 lb

## 2023-04-12 DIAGNOSIS — R55 Syncope and collapse: Secondary | ICD-10-CM

## 2023-04-12 DIAGNOSIS — Z79899 Other long term (current) drug therapy: Secondary | ICD-10-CM | POA: Diagnosis not present

## 2023-04-12 DIAGNOSIS — E790 Hyperuricemia without signs of inflammatory arthritis and tophaceous disease: Secondary | ICD-10-CM

## 2023-04-12 DIAGNOSIS — M3219 Other organ or system involvement in systemic lupus erythematosus: Secondary | ICD-10-CM

## 2023-04-12 DIAGNOSIS — M7731 Calcaneal spur, right foot: Secondary | ICD-10-CM

## 2023-04-12 DIAGNOSIS — K754 Autoimmune hepatitis: Secondary | ICD-10-CM | POA: Diagnosis not present

## 2023-04-12 DIAGNOSIS — I10 Essential (primary) hypertension: Secondary | ICD-10-CM

## 2023-04-12 DIAGNOSIS — M19041 Primary osteoarthritis, right hand: Secondary | ICD-10-CM

## 2023-04-12 DIAGNOSIS — M62838 Other muscle spasm: Secondary | ICD-10-CM

## 2023-04-12 DIAGNOSIS — M545 Low back pain, unspecified: Secondary | ICD-10-CM

## 2023-04-12 DIAGNOSIS — M19042 Primary osteoarthritis, left hand: Secondary | ICD-10-CM

## 2023-04-12 DIAGNOSIS — D472 Monoclonal gammopathy: Secondary | ICD-10-CM

## 2023-04-12 DIAGNOSIS — M19071 Primary osteoarthritis, right ankle and foot: Secondary | ICD-10-CM

## 2023-04-12 DIAGNOSIS — E559 Vitamin D deficiency, unspecified: Secondary | ICD-10-CM

## 2023-04-12 DIAGNOSIS — M19072 Primary osteoarthritis, left ankle and foot: Secondary | ICD-10-CM

## 2023-04-12 DIAGNOSIS — M170 Bilateral primary osteoarthritis of knee: Secondary | ICD-10-CM

## 2023-04-12 DIAGNOSIS — M7732 Calcaneal spur, left foot: Secondary | ICD-10-CM

## 2023-04-12 LAB — CBC WITH DIFFERENTIAL/PLATELET
Absolute Monocytes: 1029 cells/uL — ABNORMAL HIGH (ref 200–950)
Basophils Absolute: 22 cells/uL (ref 0–200)
Basophils Relative: 0.3 %
HCT: 45.2 % — ABNORMAL HIGH (ref 35.0–45.0)
Lymphs Abs: 1883 cells/uL (ref 850–3900)
MCHC: 33.6 g/dL (ref 32.0–36.0)
MCV: 96 fL (ref 80.0–100.0)
MPV: 12.4 fL (ref 7.5–12.5)
Neutrophils Relative %: 59.4 %
Platelets: 179 10*3/uL (ref 140–400)
RDW: 12.9 % (ref 11.0–15.0)
Total Lymphocyte: 25.8 %
WBC: 7.3 10*3/uL (ref 3.8–10.8)

## 2023-04-12 MED ORDER — METHOCARBAMOL 500 MG PO TABS: 500.0000 mg | ORAL_TABLET | Freq: Every day | ORAL | 0 refills | Status: AC | PRN

## 2023-04-13 ENCOUNTER — Telehealth: Payer: Self-pay | Admitting: *Deleted

## 2023-04-13 DIAGNOSIS — M3219 Other organ or system involvement in systemic lupus erythematosus: Secondary | ICD-10-CM

## 2023-04-13 DIAGNOSIS — Z79899 Other long term (current) drug therapy: Secondary | ICD-10-CM

## 2023-04-13 LAB — CBC WITH DIFFERENTIAL/PLATELET
Eosinophils Absolute: 29 cells/uL (ref 15–500)
Eosinophils Relative: 0.4 %
Hemoglobin: 15.2 g/dL (ref 11.7–15.5)
MCH: 32.3 pg (ref 27.0–33.0)
Monocytes Relative: 14.1 %
Neutro Abs: 4336 cells/uL (ref 1500–7800)
RBC: 4.71 10*6/uL (ref 3.80–5.10)

## 2023-04-13 LAB — C3 AND C4
C3 Complement: 198 mg/dL
C4 Complement: 35 mg/dL

## 2023-04-13 LAB — COMPLETE METABOLIC PANEL WITH GFR
AG Ratio: 1.4 (calc) (ref 1.0–2.5)
ALT: 24 U/L (ref 6–29)
AST: 35 U/L (ref 10–35)
Albumin: 4.4 g/dL (ref 3.6–5.1)
Alkaline phosphatase (APISO): 83 U/L (ref 37–153)
CO2: 24 mmol/L (ref 20–32)
Calcium: 9.9 mg/dL (ref 8.6–10.4)
Chloride: 97 mmol/L — ABNORMAL LOW (ref 98–110)
Creat: 0.67 mg/dL (ref 0.60–0.95)
Globulin: 3.2 g/dL (calc) (ref 1.9–3.7)
Glucose, Bld: 213 mg/dL — ABNORMAL HIGH (ref 65–99)
Potassium: 3.5 mmol/L (ref 3.5–5.3)
Sodium: 137 mmol/L (ref 135–146)
Total Bilirubin: 1.2 mg/dL (ref 0.2–1.2)
Total Protein: 7.6 g/dL (ref 6.1–8.1)

## 2023-04-13 LAB — PROTEIN / CREATININE RATIO, URINE
Creatinine, Urine: 184 mg/dL (ref 20–275)
Protein/Creat Ratio: 386 mg/g creat — ABNORMAL HIGH (ref 24–184)
Protein/Creatinine Ratio: 0.386 mg/mg creat — ABNORMAL HIGH (ref 0.024–0.184)
Total Protein, Urine: 71 mg/dL — ABNORMAL HIGH (ref 5–24)

## 2023-04-13 LAB — SEDIMENTATION RATE: Sed Rate: 6 mm/h (ref 0–30)

## 2023-04-13 NOTE — Progress Notes (Signed)
Complements WNL

## 2023-04-13 NOTE — Telephone Encounter (Signed)
-----   Message from Gearldine Bienenstock sent at 04/13/2023  7:02 AM EDT ----- CBC stable.  Glucose is elevated-213. Rest of CMP WNL. Protein creatinine ratio is elevated-total urine protein is elevated. Recheck in 2-3 weeks. ESR WNL.

## 2023-04-13 NOTE — Progress Notes (Signed)
CBC stable.  Glucose is elevated-213. Rest of CMP WNL. Protein creatinine ratio is elevated-total urine protein is elevated. Recheck in 2-3 weeks. ESR WNL.

## 2023-04-14 NOTE — Progress Notes (Signed)
dsDNA is negative.  Labs are not consistent with a flare.

## 2023-04-19 DIAGNOSIS — M62830 Muscle spasm of back: Secondary | ICD-10-CM | POA: Insufficient documentation

## 2023-04-19 DIAGNOSIS — L309 Dermatitis, unspecified: Secondary | ICD-10-CM | POA: Insufficient documentation

## 2023-04-24 ENCOUNTER — Other Ambulatory Visit: Payer: Self-pay | Admitting: Nurse Practitioner

## 2023-04-24 DIAGNOSIS — Z1231 Encounter for screening mammogram for malignant neoplasm of breast: Secondary | ICD-10-CM

## 2023-05-03 ENCOUNTER — Ambulatory Visit
Admission: RE | Admit: 2023-05-03 | Discharge: 2023-05-03 | Disposition: A | Discharge status: Home / Self Care | Payer: 59 | Source: Ambulatory Visit | Attending: Nurse Practitioner | Admitting: Nurse Practitioner

## 2023-05-03 DIAGNOSIS — Z1231 Encounter for screening mammogram for malignant neoplasm of breast: Secondary | ICD-10-CM

## 2023-05-04 ENCOUNTER — Encounter: Payer: Self-pay | Admitting: Podiatry

## 2023-05-04 ENCOUNTER — Ambulatory Visit (INDEPENDENT_AMBULATORY_CARE_PROVIDER_SITE_OTHER): Payer: 59

## 2023-05-04 ENCOUNTER — Ambulatory Visit (INDEPENDENT_AMBULATORY_CARE_PROVIDER_SITE_OTHER): Payer: 59 | Admitting: Podiatry

## 2023-05-04 DIAGNOSIS — M778 Other enthesopathies, not elsewhere classified: Secondary | ICD-10-CM

## 2023-05-04 DIAGNOSIS — R600 Localized edema: Secondary | ICD-10-CM

## 2023-05-04 NOTE — Progress Notes (Signed)
Subjective:   Patient ID: Deanna Hart, female   DOB: 82 y.o.   MRN: [id number]   HPI Patient presents with caregiver with a lot of swelling lower extremities and states has been present for several months and has started a fluid pill and has had blood work done that is not been reviewed up to this point.  Patient does not smoke is not active   Review of Systems  All other systems reviewed and are negative.       Objective:  Physical Exam Vitals and nursing note reviewed.  Constitutional:      Appearance: She is well-developed.  Pulmonary:     Effort: Pulmonary effort is normal.  Musculoskeletal:        General: Normal range of motion.  Skin:    General: Skin is warm.  Neurological:     Mental Status: She is alert.     Vascular status moderately diminished bilateral with neurological mildly reduced.  Patient has diminished range of motion subtalar midtarsal joint and is noted to have +2 pitting edema in the midfoot into the ankle bilateral and has negative Denna Haggard' sign noted.  There is no breakdown of tissue no indications of cellulitis or other pathology and there is discomfort associated with this     Assessment:  Difficult problem to understand but appears to be more systemic versus localized     Plan:  H&P x-rays reviewed discussed arthritis flat arches and her swelling mechanism.  I did review all of her blood work and she does have some numbers that are out of normal with elevated blood glucose level but I did not note anything specific that could be causing this condition.  At this point I went ahead and I am advised on elevation compression continued fluid pill taking and reappoint to family physician and may need to see a vein doctor  X-rays indicate arthritis of the subtalar midtarsal joint spur formation moderate depression of the arches bilaterally

## 2023-08-23 NOTE — Progress Notes (Unsigned)
Office Visit Note  Patient: Deanna Hart             Date of Birth: 01-Aug-1941           MRN: [id number]             PCP: Maryagnes Amos, FNP Referring: Maryagnes Amos,* Visit Date: 09/06/2023 Occupation: @GUAROCC @  Subjective:  Medication monitoring   History of Present Illness: Deanna Hart is a 82 y.o. female with history of systemic lupus and autoimmune hepatitis.  Patient remains on  Prednisone 20 mg po daily prescribed by Dr. Elnoria Howard.  She continues to tolerate prednisone without any gaps in therapy.  She denies any signs or symptoms of a systemic lupus flare.  She denies any recent rashes.  She has not had any symptoms of Raynaud's phenomenon.  She denies any swollen lymph nodes.  She denies any oral or nasal ulcerations.  Patient experiences symptoms of dry mouth after taking her medications but denies any eye dryness.  Patient experiences intermittent warmth and discomfort in the left knee but denies any other joint pain or joint swelling at this time.  Patient states that her energy level has been stable and she has been sleeping well at night.    Activities of Daily Living:  Patient reports morning stiffness for 10-20 minutes.   Patient Denies nocturnal pain.  Difficulty dressing/grooming: Denies Difficulty climbing stairs: Denies Difficulty getting out of chair: Reports Difficulty using hands for taps, buttons, cutlery, and/or writing: Denies  Review of Systems  Constitutional:  Negative for fatigue.  HENT:  Positive for mouth dryness. Negative for mouth sores.   Eyes:  Negative for dryness.  Respiratory:  Negative for shortness of breath.   Cardiovascular:  Negative for chest pain and palpitations.  Gastrointestinal:  Negative for blood in stool, constipation and diarrhea.  Endocrine: Negative for increased urination.  Genitourinary:  Negative for involuntary urination.  Musculoskeletal:  Positive for gait problem, myalgias, morning stiffness,  muscle tenderness and myalgias. Negative for joint pain, joint pain, joint swelling and muscle weakness.  Skin:  Negative for color change, rash, hair loss and sensitivity to sunlight.  Allergic/Immunologic: Negative for susceptible to infections.  Neurological:  Negative for dizziness and headaches.  Hematological:  Negative for swollen glands.  Psychiatric/Behavioral:  Negative for depressed mood and sleep disturbance. The patient is not nervous/anxious.     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   MGUS (monoclonal gammopathy of unknown significance) 06/01/2017   Hyperuricemia 06/01/2017   Elevated LFTs 03/03/2017   Acute pain of right shoulder 11/28/2016   High risk medications (not anticoagulants) long-term use 07/19/2016   Rheumatoid factor positive 07/19/2016   SLE (systemic lupus erythematosus) (HCC) 07/18/2016   Primary osteoarthritis of both hands 07/18/2016   Primary osteoarthritis of both feet 07/18/2016   Calcaneal spur of both feet 07/18/2016   Osteoarthritis of both knees 07/18/2016   Hypertension  07/18/2016   Obesity 07/18/2016   Vitamin D deficiency 07/18/2016    Past Medical History:  Diagnosis Date   Calcaneal spur of both feet 07/18/2016   Gout    Hypertension 07/18/2016   Obesity 07/18/2016   Osteoarthritis of both feet 07/18/2016   Osteoarthritis of both hands 07/18/2016   Osteoarthritis  of both knees 07/18/2016   Severe Right lateral, Left med.    SLE (systemic lupus erythematosus) (HCC) 07/18/2016   Positive ANA, Positive Ro, Positive Smith, Positive RNP, Positive RF, Negative CCP,  Elevated LFTs   Vitamin D deficiency 07/18/2016    Family History  Problem Relation Age of Onset   Cancer Brother    Cancer Maternal Uncle    Past Surgical History:  Procedure Laterality Date   BREAST EXCISIONAL BIOPSY Left    BREAST SURGERY     EYE SURGERY Bilateral 07/2017   cataracts   Social History   Social History Narrative   Not on file   Immunization History  Administered Date(s) Administered   Influenza, High Dose Seasonal PF 05/28/2019, 06/07/2021, 10/19/2022   Moderna Sars-Covid-2 Vaccination 12/19/2019   PFIZER(Purple Top)SARS-COV-2 Vaccination 12/19/2019, 01/13/2020, 09/30/2020   Pneumococcal Polysaccharide-23 06/07/2021     Objective: Vital Signs: BP 122/79 (BP Location: Left Arm, Patient Position: Sitting, Cuff Size: Large)   Pulse 85   Resp 15   Ht 5\' 4"  (1.626 m)   Wt 203 lb 3.2 oz (92.2 kg)   BMI 34.88 kg/m    Physical Exam Vitals and nursing note reviewed.  Constitutional:      Appearance: She is well-developed.  HENT:     Head: Normocephalic and atraumatic.  Eyes:     Conjunctiva/sclera: Conjunctivae normal.  Cardiovascular:     Rate and Rhythm: Normal rate and regular rhythm.     Heart sounds: Normal heart sounds.  Pulmonary:     Effort: Pulmonary effort is normal.     Breath sounds: Normal breath sounds.  Abdominal:     General: Bowel sounds are normal.     Palpations: Abdomen is soft.  Musculoskeletal:     Cervical back: Normal range of motion.  Lymphadenopathy:     Cervical: No cervical adenopathy.  Skin:    General: Skin is warm and dry.     Capillary Refill: Capillary refill takes less than 2 seconds.  Neurological:     Mental Status: She is alert and oriented to person, place, and time.  Psychiatric:        Behavior: Behavior  normal.      Musculoskeletal Exam: C-spine has good range of motion with no discomfort.  Postural thoracic kyphosis noted.  Shoulder joints have good range of motion with no discomfort.  Elbow joints, wrist joints, MCPs, PIPs, DIPs have good range of motion with no synovitis.  CMC, PIP, DIP thickening consistent with osteoarthritis of both hands.  Hip joints have good range of motion with no groin pain.  Limited extension of the left knee with warmth but no effusion noted.  Right knee has good range of motion with no warmth or effusion.  Ankle joints have good range of motion with no tenderness or joint swelling.  CDAI Exam: CDAI Score: -- Patient Global: --; Provider Global: -- Swollen: --; Tender: -- Joint Exam 09/06/2023   No joint exam has been documented for this visit   There is currently no information documented on the homunculus. Go to the Rheumatology activity and complete the homunculus joint exam.  Investigation: No additional findings.  Imaging: No results found.  Recent Labs: Lab Results  Component Value Date   WBC 7.3 04/12/2023   HGB 15.2 04/12/2023   PLT 179 04/12/2023   NA 137 04/12/2023   K 3.5 04/12/2023   CL 97 (L) 04/12/2023   CO2 24 04/12/2023   GLUCOSE 213 (H) 04/12/2023   BUN 13 04/12/2023   CREATININE 0.67 04/12/2023   BILITOT 1.2 04/12/2023   ALKPHOS 82 09/20/2021   AST 35 04/12/2023   ALT 24 04/12/2023   PROT 7.6 04/12/2023   ALBUMIN 3.7 09/20/2021   CALCIUM 9.9 04/12/2023   GFRAA 96 07/01/2020    Speciality Comments: No specialty comments available.  Procedures:  No procedures performed Allergies: Plaquenil [hydroxychloroquine sulfate]   Assessment / Plan:     Visit Diagnoses: Other systemic lupus erythematosus with other organ involvement (HCC) - +ANA +Ro +Smith +RNP + RF neg CCP / h/o skin biopsy + by Dr Yetta Barre.  Previously had intolerance to Plaquenil and Imuran: She has not had any signs or symptoms of a systemic lupus flare.  She  has clinically been doing well taking prednisone 20 mg daily.  She has no synovitis on examination today.  She continues to experience intermittent discomfort in the left knee has limited extension and warmth on examination today.  No effusion noted.  No cervical lymphadenopathy noted.  No oral or nasal ulcerations.  She experiences increased mouth dryness after taking her medications but has not had any eye dryness.  No symptoms of Raynaud's phenomenon.  No recent rashes.  Discussed the importance of avoiding sun exposure.  Her energy level has been stable and she has been sleeping well at night. Lab work from 04/12/23: urine protein creatinine ratio elevated, dsDNA negative, complements WNL, and ESR WNL the following lab work will be obtained today for further evaluation. CBC and CMP updated on 04/12/23. Orders for CBC and CMP released today.  Patient will remain on prednisone 20 mg daily as prescribed by Dr. Elnoria Howard.  She was advised to notify us if she develops any signs or symptoms of a flare. - Plan: Protein / creatinine ratio, urine, CBC with Differential/Platelet, COMPLETE METABOLIC PANEL WITH GFR, ANA, C3 and C4, Anti-DNA antibody, double-stranded, Sedimentation rate  High risk medication use - Prednisone 20 mg by mouth daily--prescribed by Dr. Elnoria Howard.  Patient is aware of the risk of long term prednisone use.  CBC and CMP drawn on 04/12/2023.  Orders for CBC and CMP were released today. - Plan: CBC with Differential/Platelet, COMPLETE METABOLIC PANEL WITH GFR  Autoimmune hepatitis (HCC) - Followed by Dr. Dionne Bucy remains on prednisone 20 mg daily.  CMP updated today.  According to the patient she will be following up with Dr. Elnoria Howard in April 2025.- Plan: COMPLETE METABOLIC PANEL WITH GFR  Primary osteoarthritis of both hands: PIP and DIP thickening.  CMC joint prominence.  No synovitis noted.  Complete fist formation bilaterally. Discussed the importance of joint protection and muscle strengthening.     Primary osteoarthritis of both knees: Slightly limited extension and warmth noted in the left knee.  No knee joint effusion noted.  Primary osteoarthritis of both feet: She is not experiencing any increased discomfort in her feet at this time.  She has good range of motion of both ankle joints.  She has been wearing compression which has been helpful.  Calcaneal spur of both feet: She is wearing proper fitting shoes.   MGUS (monoclonal gammopathy of unknown significance) -SPEP updated today.  Plan: Serum protein  electrophoresis with reflex  Hyperuricemia - Uric acid level updated today.  She has discontinued allopurinol.  No signs or symptoms of a gout flare.  Plan: Uric acid  Vitamin D deficiency -She has been taking a vitamin D supplement daily.  Vitamin D level will be checked today.  Plan: VITAMIN D 25 Hydroxy (Vit-D Deficiency, Fractures)  Other medical conditions are listed as follows:  Essential hypertension: Blood pressure was 122/79 today in the office.  Vasovagal syncope  Orders: Orders Placed This Encounter  Procedures   Protein / creatinine ratio, urine   CBC with Differential/Platelet   COMPLETE METABOLIC PANEL WITH GFR   ANA   C3 and C4   Anti-DNA antibody, double-stranded   Sedimentation rate   VITAMIN D 25 Hydroxy (Vit-D Deficiency, Fractures)   Uric acid   Serum protein electrophoresis with reflex   No orders of the defined types were placed in this encounter.    Follow-Up Instructions: Return in about 5 months (around 02/04/2024) for Systemic lupus erythematosus.   Gearldine Bienenstock, PA-C  Note - This record has been created using Dragon software.  Chart creation errors have been sought, but may not always  have been located. Such creation errors do not reflect on  the standard of medical care.

## 2023-09-06 ENCOUNTER — Ambulatory Visit: Payer: 59 | Attending: Physician Assistant | Admitting: Physician Assistant

## 2023-09-06 ENCOUNTER — Encounter: Payer: Self-pay | Admitting: Physician Assistant

## 2023-09-06 VITALS — BP 122/79 | HR 85 | Resp 15 | Ht 64.0 in | Wt 203.2 lb

## 2023-09-06 DIAGNOSIS — I10 Essential (primary) hypertension: Secondary | ICD-10-CM

## 2023-09-06 DIAGNOSIS — M3219 Other organ or system involvement in systemic lupus erythematosus: Secondary | ICD-10-CM | POA: Diagnosis not present

## 2023-09-06 DIAGNOSIS — M19041 Primary osteoarthritis, right hand: Secondary | ICD-10-CM

## 2023-09-06 DIAGNOSIS — M170 Bilateral primary osteoarthritis of knee: Secondary | ICD-10-CM

## 2023-09-06 DIAGNOSIS — Z79899 Other long term (current) drug therapy: Secondary | ICD-10-CM

## 2023-09-06 DIAGNOSIS — M7731 Calcaneal spur, right foot: Secondary | ICD-10-CM

## 2023-09-06 DIAGNOSIS — K754 Autoimmune hepatitis: Secondary | ICD-10-CM

## 2023-09-06 DIAGNOSIS — D472 Monoclonal gammopathy: Secondary | ICD-10-CM

## 2023-09-06 DIAGNOSIS — E559 Vitamin D deficiency, unspecified: Secondary | ICD-10-CM

## 2023-09-06 DIAGNOSIS — M19071 Primary osteoarthritis, right ankle and foot: Secondary | ICD-10-CM

## 2023-09-06 DIAGNOSIS — M7732 Calcaneal spur, left foot: Secondary | ICD-10-CM

## 2023-09-06 DIAGNOSIS — M19072 Primary osteoarthritis, left ankle and foot: Secondary | ICD-10-CM

## 2023-09-06 DIAGNOSIS — M19042 Primary osteoarthritis, left hand: Secondary | ICD-10-CM

## 2023-09-06 DIAGNOSIS — E790 Hyperuricemia without signs of inflammatory arthritis and tophaceous disease: Secondary | ICD-10-CM

## 2023-09-06 DIAGNOSIS — R55 Syncope and collapse: Secondary | ICD-10-CM

## 2023-09-08 LAB — CBC WITH DIFFERENTIAL/PLATELET
Absolute Lymphocytes: 2354 {cells}/uL (ref 850–3900)
Absolute Monocytes: 844 {cells}/uL (ref 200–950)
Basophils Absolute: 41 {cells}/uL (ref 0–200)
Basophils Relative: 0.7 %
Eosinophils Absolute: 41 {cells}/uL (ref 15–500)
Eosinophils Relative: 0.7 %
HCT: 46.1 % — ABNORMAL HIGH (ref 35.0–45.0)
Hemoglobin: 15.1 g/dL (ref 11.7–15.5)
MCH: 31.5 pg (ref 27.0–33.0)
MCHC: 32.8 g/dL (ref 32.0–36.0)
MCV: 96.2 fL (ref 80.0–100.0)
MPV: 12.3 fL (ref 7.5–12.5)
Monocytes Relative: 14.3 %
Neutro Abs: 2620 {cells}/uL (ref 1500–7800)
Neutrophils Relative %: 44.4 %
Platelets: 187 10*3/uL (ref 140–400)
RBC: 4.79 10*6/uL (ref 3.80–5.10)
RDW: 12.6 % (ref 11.0–15.0)
Total Lymphocyte: 39.9 %
WBC: 5.9 10*3/uL (ref 3.8–10.8)

## 2023-09-08 LAB — PROTEIN ELECTROPHORESIS, SERUM, WITH REFLEX
Albumin ELP: 4.1 g/dL (ref 3.8–4.8)
Alpha 1: 0.3 g/dL (ref 0.2–0.3)
Alpha 2: 0.8 g/dL (ref 0.5–0.9)
Beta 2: 0.5 g/dL (ref 0.2–0.5)
Beta Globulin: 0.5 g/dL (ref 0.4–0.6)
Gamma Globulin: 1.2 g/dL (ref 0.8–1.7)
Total Protein: 7.4 g/dL (ref 6.1–8.1)

## 2023-09-08 LAB — COMPLETE METABOLIC PANEL WITH GFR
AG Ratio: 1.3 (calc) (ref 1.0–2.5)
ALT: 31 U/L — ABNORMAL HIGH (ref 6–29)
AST: 31 U/L (ref 10–35)
Albumin: 4.2 g/dL (ref 3.6–5.1)
Alkaline phosphatase (APISO): 157 U/L — ABNORMAL HIGH (ref 37–153)
BUN/Creatinine Ratio: 19 (calc) (ref 6–22)
BUN: 11 mg/dL (ref 7–25)
CO2: 28 mmol/L (ref 20–32)
Calcium: 9.4 mg/dL (ref 8.6–10.4)
Chloride: 100 mmol/L (ref 98–110)
Creat: 0.57 mg/dL — ABNORMAL LOW (ref 0.60–0.95)
Globulin: 3.2 g/dL (ref 1.9–3.7)
Glucose, Bld: 163 mg/dL — ABNORMAL HIGH (ref 65–99)
Potassium: 3.4 mmol/L — ABNORMAL LOW (ref 3.5–5.3)
Sodium: 140 mmol/L (ref 135–146)
Total Bilirubin: 0.6 mg/dL (ref 0.2–1.2)
Total Protein: 7.4 g/dL (ref 6.1–8.1)
eGFR: 90 mL/min/{1.73_m2} (ref >=60)

## 2023-09-08 LAB — SEDIMENTATION RATE: Sed Rate: 2 mm/h (ref 0–30)

## 2023-09-08 LAB — VITAMIN D 25 HYDROXY (VIT D DEFICIENCY, FRACTURES): Vit D, 25-Hydroxy: 39 ng/mL (ref 30–100)

## 2023-09-08 LAB — URIC ACID: Uric Acid, Serum: 4.6 mg/dL (ref 2.5–7.0)

## 2023-09-08 LAB — PROTEIN / CREATININE RATIO, URINE
Creatinine, Urine: 86 mg/dL (ref 20–275)
Protein/Creat Ratio: 221 mg/g{creat} — ABNORMAL HIGH (ref 24–184)
Protein/Creatinine Ratio: 0.221 mg/mg{creat} — ABNORMAL HIGH (ref 0.024–0.184)
Total Protein, Urine: 19 mg/dL (ref 5–24)

## 2023-09-08 LAB — C3 AND C4
C3 Complement: 170 mg/dL
C4 Complement: 29 mg/dL

## 2023-09-08 LAB — ANA: Anti Nuclear Antibody (ANA): POSITIVE — AB

## 2023-09-08 LAB — ANTI-DNA ANTIBODY, DOUBLE-STRANDED: ds DNA Ab: 1 [IU]/mL

## 2023-09-08 LAB — ANTI-NUCLEAR AB-TITER (ANA TITER): ANA Titer 1: 1:320 {titer} — ABNORMAL HIGH

## 2023-09-10 NOTE — Progress Notes (Signed)
Urine protein creatinine ratio remains borderline elevated but continues to trend down.   CBC WNL  Glucose is 163.  Potassium is borderline elevated.  Alk phos is elevated and ALT is borderline elevated.  AST WNL.  Please notify the patient and forward results to Dr. Elnoria Howard.  ANA remains positive.  dsDNA negative  ESR WNL  Vitamin D WNL.  Complements WNL  Uric acid WNL  SPEP normal.

## 2023-10-18 ENCOUNTER — Encounter (HOSPITAL_COMMUNITY): Payer: Self-pay

## 2023-10-18 ENCOUNTER — Ambulatory Visit (HOSPITAL_COMMUNITY)
Admission: RE | Admit: 2023-10-18 | Discharge: 2023-10-18 | Disposition: A | Discharge status: Home / Self Care | Payer: 59 | Source: Ambulatory Visit | Attending: Emergency Medicine | Admitting: Emergency Medicine

## 2023-10-18 VITALS — BP 162/101 | HR 77 | Temp 99.2°F | Resp 20

## 2023-10-18 DIAGNOSIS — G8929 Other chronic pain: Secondary | ICD-10-CM | POA: Diagnosis not present

## 2023-10-18 DIAGNOSIS — M545 Low back pain, unspecified: Secondary | ICD-10-CM | POA: Diagnosis not present

## 2023-10-18 MED ORDER — DEXAMETHASONE SODIUM PHOSPHATE 10 MG/ML IJ SOLN
INTRAMUSCULAR | Status: AC
  Filled 2023-10-18: qty 1

## 2023-10-18 MED ORDER — DEXAMETHASONE SODIUM PHOSPHATE 10 MG/ML IJ SOLN
10.0000 mg | Freq: Once | INTRAMUSCULAR | Status: AC
  Administered 2023-10-18: 10 mg via INTRAMUSCULAR

## 2023-10-18 MED ORDER — LIDOCAINE HCL 2 % IJ SOLN
INTRAMUSCULAR | Status: AC
  Filled 2023-10-18: qty 20

## 2023-10-18 NOTE — ED Triage Notes (Signed)
Pt states she fell x2 back in 4/24 and has had mid back pain and lt leg pain. States has been taking muscle relaxer's with no relief.

## 2023-10-18 NOTE — ED Provider Notes (Signed)
[facility]    CSN: [id number] Arrival date & time: 10/18/23  4098      History   Chief Complaint Chief Complaint  Patient presents with   Back Pain   Leg Pain    HPI Deanna Hart is a 83 y.o. female.   Patient presents to clinic complaining of bilateral low back pain since a few falls she had early in 2024.  She has been trying to take muscle relaxers without much relief, has occasionally been doubling her dose.  She has also tried topical lidocaine, the patches do not stick to her, and topical Voltaren gel.  Has not had any numbness, tingling or radiation down into her legs.  No recent falls in the past few months.  No trouble walking.  No dysuria, urgency, frequency, fevers or nausea.  No inner leg numbness.    The history is provided by the patient and medical records.  Back Pain Associated symptoms: leg pain   Leg Pain   Past Medical History:  Diagnosis Date   Calcaneal spur of both feet 07/18/2016   Gout    Hypertension 07/18/2016   Obesity 07/18/2016   Osteoarthritis of both feet 07/18/2016   Osteoarthritis of both hands 07/18/2016   Osteoarthritis of both knees 07/18/2016   Severe Right lateral, Left med.    SLE (systemic lupus erythematosus) (HCC) 07/18/2016   Positive ANA, Positive Ro, Positive Smith, Positive RNP, Positive RF, Negative CCP,  Elevated LFTs   Vitamin D deficiency 07/18/2016    Patient Active Problem List   Diagnosis Date Noted   Acute dermatitis 04/19/2023   Spasm of back muscles 04/19/2023   Atopic dermatitis 06/29/2022   Pruritic rash 03/19/2022   Dyspnea 10/06/2021   Fluttering heart 10/06/2021   Hypokalemia 10/06/2021   Edema of lower extremity 08/27/2021   Hyperglycemia due to type 2 diabetes mellitus (HCC) 04/20/2021   Hyperlipidemia 04/20/2021   Chronic fatigue syndrome 03/16/2021   Vitamin B12 deficiency (non anemic) 01/27/2021   Type 2 diabetes mellitus (HCC) 10/23/2020   Presbycusis of both ears  12/24/2019   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   MGUS (monoclonal gammopathy of unknown significance) 06/01/2017   Hyperuricemia 06/01/2017   Elevated LFTs 03/03/2017   Acute pain of right shoulder 11/28/2016   High risk medications (not anticoagulants) long-term use 07/19/2016   Rheumatoid factor positive 07/19/2016   SLE (systemic lupus erythematosus) (HCC) 07/18/2016   Primary osteoarthritis of both hands 07/18/2016   Primary osteoarthritis of both feet 07/18/2016   Calcaneal spur of both feet 07/18/2016   Osteoarthritis of both knees 07/18/2016   Hypertension 07/18/2016   Obesity 07/18/2016   Vitamin D deficiency 07/18/2016    Past Surgical History:  Procedure Laterality Date   BREAST EXCISIONAL BIOPSY Left    BREAST SURGERY     EYE SURGERY Bilateral 07/2017   cataracts    OB History   No obstetric history on file.      Home Medications    Prior to Admission medications   Medication Sig Start Date End Date Taking? Authorizing Provider  allopurinol (ZYLOPRIM) 300 MG tablet Take 300 mg by mouth daily. Patient not taking: Reported on 09/06/2023    [provider]  amLODipine (NORVASC) 10 MG tablet Take 10 mg by mouth daily. 12/03/19   [provider]  Aspirin-Acetaminophen-Caffeine (HEADACHE RELIEF PO) Take by mouth. Patient not taking: Reported on 09/06/2023    [provider]  baclofen (LIORESAL) 10 MG tablet Take 10 mg  by mouth 4 (four) times daily.    [provider]  cholecalciferol (VITAMIN D3) 25 MCG (1000 UNIT) tablet Take 1,000 Units by mouth daily.    [provider]  Cholecalciferol (VITAMIN D3) 50 MCG (2000 UT) capsule Take 2,000 Units by mouth daily. Patient not taking: Reported on 09/06/2023    [provider]  colchicine 0.6 MG tablet TAKE 1 TABLET BY MOUTH EVERY DAY FOR 5 DAYS AS DIRECTED Patient not taking: Reported on 09/06/2023    [provider]  Cyanocobalamin (VITAMIN B-12 PO)  Take by mouth daily.    [provider]  dapagliflozin propanediol (FARXIGA) 10 MG TABS tablet Take 10 mg by mouth daily.    [provider]  metFORMIN (GLUCOPHAGE) 500 MG tablet Take 1,000 mg by mouth 2 (two) times daily with a meal.    [provider]  methocarbamol (ROBAXIN) 500 MG tablet Take 1 tablet (500 mg total) by mouth daily as needed. Patient not taking: Reported on 09/06/2023 04/12/23   Gearldine Bienenstock, PA-C  metoprolol succinate (TOPROL-XL) 25 MG 24 hr tablet Take 25 mg by mouth daily.    [provider]  potassium chloride (KLOR-CON M) 10 MEQ tablet Take 10 mEq by mouth as needed.    [provider]  predniSONE (DELTASONE) 10 MG tablet Take 2 tablets (20 mg total) by mouth at breakfast daily. 07/29/19   Gearldine Bienenstock, PA-C  triamcinolone cream (KENALOG) 0.1 % Apply 1 application  topically as needed. 12/12/16   [provider]  triamterene-hydrochlorothiazide (DYAZIDE) 37.5-25 MG capsule Take 1 capsule by mouth daily.    [provider]    Family History Family History  Problem Relation Age of Onset   Cancer Brother    Cancer Maternal Uncle     Social History Social History   Tobacco Use   Smoking status: Former    Current packs/day: 0.00    Average packs/day: 0.1 packs/day for 3.0 years (0.3 ttl pk-yrs)    Types: Cigarettes    Start date: 49    Quit date: 74    Years since quitting: 53.1    Passive exposure: Past   Smokeless tobacco: Former    Types: Chew    Quit date: 1972  Vaping Use   Vaping status: Never Used  Substance Use Topics   Alcohol use: No   Drug use: Never     Allergies   Plaquenil [hydroxychloroquine sulfate]   Review of Systems Review of Systems  Per HPI   Physical Exam Triage Vital Signs ED Triage Vitals  Encounter Vitals Group     BP 10/18/23 1053 (!) 162/101     Systolic BP Percentile --      Diastolic BP Percentile --      Pulse Rate 10/18/23 1053 77     Resp  10/18/23 1053 20     Temp 10/18/23 1053 99.2 F (37.3 C)     Temp Source 10/18/23 1053 Oral     SpO2 10/18/23 1053 95 %     Weight --      Height --      Head Circumference --      Peak Flow --      Pain Score 10/18/23 1054 8     Pain Loc --      Pain Education --      Exclude from Growth Chart --    No data found.  Updated Vital Signs BP (!) 162/101 (BP Location: Left Arm)  Pulse 77   Temp 99.2 F (37.3 C) (Oral)   Resp 20   SpO2 95%   Visual Acuity Right Eye Distance:   Left Eye Distance:   Bilateral Distance:    Right Eye Near:   Left Eye Near:    Bilateral Near:     Physical Exam Vitals and nursing note reviewed.  Constitutional:      Appearance: Normal appearance.  HENT:     Head: Normocephalic and atraumatic.     Right Ear: External ear normal.     Left Ear: External ear normal.     Nose: Nose normal.     Mouth/Throat:     Mouth: Mucous membranes are moist.  Eyes:     Conjunctiva/sclera: Conjunctivae normal.  Pulmonary:     Effort: Pulmonary effort is normal. No respiratory distress.  Musculoskeletal:        General: Tenderness present. No signs of injury.     Cervical back: Normal.     Thoracic back: Normal.     Lumbar back: Tenderness present. No bony tenderness.       Back:     Comments: Bilateral muscular low back pain that is worsened with movement. No spinal TTP, step-off or deformity.   Skin:    General: Skin is warm and dry.  Neurological:     General: No focal deficit present.     Mental Status: She is alert and oriented to person, place, and time.  Psychiatric:        Mood and Affect: Mood normal.        Behavior: Behavior normal. Behavior is cooperative.      UC Treatments / Results  Labs (all labs ordered are listed, but only abnormal results are displayed) Labs Reviewed - No data to display  EKG   Radiology No results found.  Procedures Procedures (including critical care time)  Medications Ordered in  UC Medications  dexamethasone (DECADRON) injection 10 mg (has no administration in time range)    Initial Impression / Assessment and Plan / UC Course  I have reviewed the triage vital signs and the nursing notes.  Pertinent labs & imaging results that were available during my care of the patient were reviewed by me and considered in my medical decision making (see chart for details).  Vitals and triage reviewed, patient is hemodynamically stable.  Presents to clinic for chronic bilateral low back pain.  No recent falls or trauma.  Muscular lumbar back pain with tenderness to palpation and is worsened with movement.  Spine without step-off or deformity.  Imaging deferred as there is no new trauma or falls.  Has been treated with muscle relaxers without any improvement, will trial one-time steroid injection to see if this is adequate to relieve her pain and encouraged orthopedic follow-up for chronic back pain.  Without any red flag symptoms of numbness, weakness, tingling or incontinence.  Plan of care, follow-up care return precautions given, no questions at this time.    Final Clinical Impressions(s) / UC Diagnoses   Final diagnoses:  Chronic bilateral low back pain without sciatica     Discharge Instructions      We have given you a steroid injection to help with pain and inflammation.  I suggest topical Voltaren gel, heat and gentle stretching.  Since this has been ongoing since last year please follow-up with orthopedic for further advanced evaluation.  Return to clinic for any new or urgent symptoms.    ED Prescriptions   None  PDMP not reviewed this encounter.   Liviana Mills, [place], [place] 10/18/23 [phone]

## 2023-10-18 NOTE — Discharge Instructions (Addendum)
We have given you a steroid injection to help with pain and inflammation.  I suggest topical Voltaren gel, heat and gentle stretching.  Since this has been ongoing since last year please follow-up with orthopedic for further advanced evaluation.  Return to clinic for any new or urgent symptoms.

## 2024-02-13 NOTE — Progress Notes (Deleted)
 Office Visit Note  Patient: Deanna Hart             Date of Birth: 01/22/1941           MRN: [id number]             PCP: Rella Cardinal, FNP Referring: Rella Cardinal,* Visit Date: 02/26/2024 Occupation: @GUAROCC @  Subjective:  No chief complaint on file.   History of Present Illness: Deanna Hart is a 83 y.o. female ***     Activities of Daily Living:  Patient reports morning stiffness for *** {minute/hour:19697}.   Patient {ACTIONS;DENIES/REPORTS:21021675::"Denies"} nocturnal pain.  Difficulty dressing/grooming: {ACTIONS;DENIES/REPORTS:21021675::"Denies"} Difficulty climbing stairs: {ACTIONS;DENIES/REPORTS:21021675::"Denies"} Difficulty getting out of chair: {ACTIONS;DENIES/REPORTS:21021675::"Denies"} Difficulty using hands for taps, buttons, cutlery, and/or writing: {ACTIONS;DENIES/REPORTS:21021675::"Denies"}  No Rheumatology ROS completed.   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   MGUS (monoclonal gammopathy of unknown significance) 06/01/2017   Hyperuricemia 06/01/2017   Elevated LFTs 03/03/2017   Acute pain of right shoulder 11/28/2016   High risk medications (not anticoagulants) long-term use 07/19/2016   Rheumatoid factor positive 07/19/2016   SLE (systemic lupus erythematosus) (HCC) 07/18/2016   Primary osteoarthritis of both hands 07/18/2016   Primary  osteoarthritis of both feet 07/18/2016   Calcaneal spur of both feet 07/18/2016   Osteoarthritis of both knees 07/18/2016   Hypertension 07/18/2016   Obesity 07/18/2016   Vitamin D  deficiency 07/18/2016    Past Medical History:  Diagnosis Date   Calcaneal spur of both feet 07/18/2016   Gout    Hypertension 07/18/2016   Obesity 07/18/2016   Osteoarthritis of both feet 07/18/2016   Osteoarthritis of both hands 07/18/2016   Osteoarthritis of both knees 07/18/2016   Severe Right lateral, Left med.    SLE (systemic lupus erythematosus) (HCC) 07/18/2016   Positive ANA, Positive Ro, Positive Smith, Positive RNP, Positive RF, Negative CCP,  Elevated LFTs   Vitamin D  deficiency 07/18/2016    Family History  Problem Relation Age of Onset   Cancer Brother    Cancer Maternal Uncle    Past Surgical History:  Procedure Laterality Date   BREAST EXCISIONAL BIOPSY Left    BREAST SURGERY     EYE SURGERY Bilateral 07/2017   cataracts   Social History   Social History Narrative   Not on file   Immunization History  Administered Date(s) Administered   Influenza, High Dose Seasonal PF 05/28/2019, 06/07/2021, 10/19/2022   Moderna Sars-Covid-2 Vaccination 12/19/2019   PFIZER(Purple Top)SARS-COV-2 Vaccination 12/19/2019, 01/13/2020, 09/30/2020   Pneumococcal Polysaccharide-23 06/07/2021     Objective: Vital Signs: There were no vitals taken for this visit.   Physical Exam   Musculoskeletal Exam: ***  CDAI Exam: CDAI Score: -- Patient Global: --; Provider Global: -- Swollen: --; Tender: -- Joint Exam 02/26/2024   No joint exam has been documented for this visit   There is currently no information documented on the homunculus. Go to  the Rheumatology activity and complete the homunculus joint exam.  Investigation: No additional findings.  Imaging: No results found.  Recent Labs: Lab Results  Component Value Date   WBC 5.9 09/06/2023   HGB 15.1 09/06/2023   PLT 187  09/06/2023   NA 140 09/06/2023   K 3.4 (L) 09/06/2023   CL 100 09/06/2023   CO2 28 09/06/2023   GLUCOSE 163 (H) 09/06/2023   BUN 11 09/06/2023   CREATININE 0.57 (L) 09/06/2023   BILITOT 0.6 09/06/2023   ALKPHOS 82 09/20/2021   AST 31 09/06/2023   ALT 31 (H) 09/06/2023   PROT 7.4 09/06/2023   PROT 7.4 09/06/2023   ALBUMIN 3.7 09/20/2021   CALCIUM 9.4 09/06/2023   GFRAA 96 07/01/2020    Speciality Comments: No specialty comments available.  Procedures:  No procedures performed Allergies: Plaquenil [hydroxychloroquine sulfate]   Assessment / Plan:     Visit Diagnoses: No diagnosis found.  Orders: No orders of the defined types were placed in this encounter.  No orders of the defined types were placed in this encounter.   Face-to-face time spent with patient was *** minutes. Greater than 50% of time was spent in counseling and coordination of care.  Follow-Up Instructions: No follow-ups on file.   Dee Farber, CMA  Note - This record has been created using Animal nutritionist.  Chart creation errors have been sought, but may not always  have been located. Such creation errors do not reflect on  the standard of medical care.

## 2024-02-26 ENCOUNTER — Ambulatory Visit: Payer: 59 | Admitting: Rheumatology

## 2024-02-26 DIAGNOSIS — M170 Bilateral primary osteoarthritis of knee: Secondary | ICD-10-CM

## 2024-02-26 DIAGNOSIS — M19041 Primary osteoarthritis, right hand: Secondary | ICD-10-CM

## 2024-02-26 DIAGNOSIS — I10 Essential (primary) hypertension: Secondary | ICD-10-CM

## 2024-02-26 DIAGNOSIS — M19072 Primary osteoarthritis, left ankle and foot: Secondary | ICD-10-CM

## 2024-02-26 DIAGNOSIS — Z79899 Other long term (current) drug therapy: Secondary | ICD-10-CM

## 2024-02-26 DIAGNOSIS — E559 Vitamin D deficiency, unspecified: Secondary | ICD-10-CM

## 2024-02-26 DIAGNOSIS — D472 Monoclonal gammopathy: Secondary | ICD-10-CM

## 2024-02-26 DIAGNOSIS — K754 Autoimmune hepatitis: Secondary | ICD-10-CM

## 2024-02-26 DIAGNOSIS — M7731 Calcaneal spur, right foot: Secondary | ICD-10-CM

## 2024-02-26 DIAGNOSIS — R55 Syncope and collapse: Secondary | ICD-10-CM

## 2024-02-26 DIAGNOSIS — M3219 Other organ or system involvement in systemic lupus erythematosus: Secondary | ICD-10-CM

## 2024-02-26 DIAGNOSIS — E790 Hyperuricemia without signs of inflammatory arthritis and tophaceous disease: Secondary | ICD-10-CM

## 2024-03-06 NOTE — Progress Notes (Signed)
 Office Visit Note  Patient: Deanna Hart             Date of Birth: February 27, 1941           MRN: [id number]             PCP: Wilkie Majel GORMAN, FNP Referring: Wilkie Majel GORMAN,* Visit Date: 03/07/2024 Occupation: @GUAROCC @  Subjective:  Pain knees  History of Present Illness: Deanna Hart is a 83 y.o. female with systemic lupus and autoimmune hepatitis.  She returns today after her last visit in December 2024.  Patient states she remains on prednisone  20 mg p.o. daily prescribed by Dr. Rollin.  She has been off Imuran  she does not recall what side effects she had.  She has off-and-on discomfort in her knee joints.  She states she uses a cane at times.  She denies any history of oral ulcers, nasal ulcers.  She gives history of dry mouth.  There is no history of malar rash, photosensitivity, raynauds phenomenon, lymphadenopathy.    Activities of Daily Living:  Patient reports morning stiffness for 0 minute.   Patient Denies nocturnal pain.  Difficulty dressing/grooming: Denies Difficulty climbing stairs: Reports Difficulty getting out of chair: Denies Difficulty using hands for taps, buttons, cutlery, and/or writing: Denies  Review of Systems  Constitutional:  Negative for fatigue.  HENT:  Positive for mouth dryness. Negative for mouth sores.   Eyes:  Negative for dryness.  Respiratory:  Negative for shortness of breath.   Cardiovascular:  Negative for palpitations and hypertension.  Gastrointestinal:  Negative for blood in stool, constipation and diarrhea.  Endocrine: Negative for increased urination.  Genitourinary:  Negative for hematuria.  Musculoskeletal:  Positive for joint pain, gait problem, joint pain and joint swelling. Negative for myalgias, morning stiffness and myalgias.  Skin:  Negative for color change, rash and sensitivity to sunlight.  Allergic/Immunologic: Positive for susceptible to infections.  Neurological:  Negative for dizziness and  headaches.  Hematological:  Negative for swollen glands.  Psychiatric/Behavioral:  Negative for depressed mood and sleep disturbance. The patient is not nervous/anxious.     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   MGUS (monoclonal gammopathy of unknown significance) 06/01/2017   Hyperuricemia 06/01/2017   Elevated LFTs 03/03/2017   Acute pain of right shoulder 11/28/2016   High risk medications (not anticoagulants) long-term use 07/19/2016   Rheumatoid factor positive 07/19/2016   SLE (systemic lupus erythematosus) (HCC) 07/18/2016   Primary osteoarthritis of both hands 07/18/2016   Primary osteoarthritis of both feet 07/18/2016   Calcaneal spur of both feet 07/18/2016   Osteoarthritis of both knees 07/18/2016   Hypertension 07/18/2016   Obesity 07/18/2016   Vitamin D  deficiency 07/18/2016    Past Medical History:  Diagnosis Date   Calcaneal spur of both feet 07/18/2016   Gout    Hypertension 07/18/2016   Obesity 07/18/2016   Osteoarthritis of both feet 07/18/2016  Osteoarthritis of both hands 07/18/2016   Osteoarthritis of both knees 07/18/2016   Severe Right lateral, Left med.    SLE (systemic lupus erythematosus) (HCC) 07/18/2016   Positive ANA, Positive Ro, Positive Smith, Positive RNP, Positive RF, Negative CCP,  Elevated LFTs   Vitamin D  deficiency 07/18/2016    Family History  Problem Relation Age  of Onset   Cancer Brother    Cancer Maternal Uncle    Past Surgical History:  Procedure Laterality Date   BREAST EXCISIONAL BIOPSY Left    BREAST SURGERY     EYE SURGERY Bilateral 07/2017   cataracts   Social History   Social History Narrative   Not on file   Immunization History  Administered Date(s) Administered   Influenza, High Dose Seasonal PF 05/28/2019, 06/07/2021, 10/19/2022   Moderna Sars-Covid-2 Vaccination 12/19/2019   PFIZER(Purple Top)SARS-COV-2 Vaccination 12/19/2019, 01/13/2020, 09/30/2020   Pneumococcal Polysaccharide-23 06/07/2021     Objective: Vital Signs: BP 121/76 (BP Location: Left Arm, Patient Position: Sitting, Cuff Size: Normal)   Pulse 85   Resp 16   Ht 5' 4 (1.626 m)   Wt 204 lb 6.4 oz (92.7 kg)   BMI 35.09 kg/m    Physical Exam Vitals and nursing note reviewed.  Constitutional:      Appearance: She is well-developed.  HENT:     Head: Normocephalic and atraumatic.   Eyes:     Conjunctiva/sclera: Conjunctivae normal.    Cardiovascular:     Rate and Rhythm: Normal rate and regular rhythm.     Heart sounds: Normal heart sounds.  Pulmonary:     Effort: Pulmonary effort is normal.     Breath sounds: Normal breath sounds.  Abdominal:     General: Bowel sounds are normal.     Palpations: Abdomen is soft.   Musculoskeletal:     Cervical back: Normal range of motion.     Right lower leg: Edema present.     Left lower leg: Edema present.  Lymphadenopathy:     Cervical: No cervical adenopathy.   Skin:    General: Skin is warm and dry.     Capillary Refill: Capillary refill takes less than 2 seconds.   Neurological:     Mental Status: She is alert and oriented to person, place, and time.   Psychiatric:        Behavior: Behavior normal.      Musculoskeletal Exam: She had good range of motion of the cervical spine.  Thoracic kyphosis was noted.  Shoulders, elbows, wrists, MCPs PIPs and DIPs were in good range of motion with no  synovitis.  PIP and DIP thickening was noted.  Hip joints with good range of motion.  She had discomfort range of motion of her knee joints.  There was no tenderness over her ankles or MTPs.  CDAI Exam: CDAI Score: -- Patient Global: --; Provider Global: -- Swollen: --; Tender: -- Joint Exam 03/07/2024   No joint exam has been documented for this visit   There is currently no information documented on the homunculus. Go to the Rheumatology activity and complete the homunculus joint exam.  Investigation: No additional findings.  Imaging: XR KNEE 3 VIEW LEFT Result Date: 03/07/2024 Severe medial compartment narrowing with medial, lateral, and intercondylar osteophytes was noted.  Chondrocalcinosis was noted.  Severe patellofemoral narrowing was noted. Impression: These findings are suggestive of severe osteoarthritis and severe chondromalacia patella.  XR KNEE 3 VIEW RIGHT Result Date: 03/07/2024 Severe lateral compartment narrowing with lateral and  intercondylar osteophytes was noted.  Severe patellofemoral narrowing was noted. Impression: These findings are suggestive of severe osteoarthritis and severe chondromalacia patella.   Recent Labs: Lab Results  Component Value Date   WBC 5.9 09/06/2023   HGB 15.1 09/06/2023   PLT 187 09/06/2023   NA 140 09/06/2023   K 3.4 (L) 09/06/2023   CL 100 09/06/2023   CO2 28 09/06/2023   GLUCOSE 163 (H) 09/06/2023   BUN 11 09/06/2023   CREATININE 0.57 (L) 09/06/2023   BILITOT 0.6 09/06/2023   ALKPHOS 82 09/20/2021   AST 31 09/06/2023   ALT 31 (H) 09/06/2023   PROT 7.4 09/06/2023   PROT 7.4 09/06/2023   ALBUMIN 3.7 09/20/2021   CALCIUM 9.4 09/06/2023   GFRAA 96 07/01/2020    Speciality Comments: No specialty comments available.  Procedures:  No procedures performed Allergies: Plaquenil [hydroxychloroquine sulfate]   Assessment / Plan:     Visit Diagnoses: Other systemic lupus erythematosus with other organ involvement (HCC) - +ANA  +Ro +Smith +RNP + RF neg CCP / h/o skin biopsy + by Dr Joshua.  Previously had intolerance to Plaquenil and Imuran : -She gives history of fatigue and some dry mouth.  She denies any history of oral ulcers, nasal ulcers, malar rash, photosensitivity, Raynaud's or inflammatory arthritis.  She continues to have pain and discomfort in the bilateral knee joints.  She states the pain and discomfort in the knee joints has increased.  I will obtain autoimmune labs today.  Plan: Protein / creatinine ratio, urine, Anti-DNA antibody, double-stranded, C3 and C4, Sedimentation rate, Sjogrens syndrome-A extractable nuclear antibody  High risk medication use - Prednisone  20 mg by mouth daily--prescribed by Dr. Rollin. - Plan: CBC with Differential/Platelet, Comprehensive metabolic panel with GFR  Long term (current) use of systemic steroids -long-term side effects of prednisone  use including weight gain, cataracts, heart disease, hyperlipidemia and osteoporosis were discussed.  I will check hemoglobin A1c today.  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  She remains on prednisone  20 mg daily.  Primary osteoarthritis of both hands-she has PIP and DIP thickening.  No synovitis was noted.  Primary osteoarthritis of both knees -she has been experiencing increased discomfort in her knee joints.  No previous x-rays are available.  I will obtain some x-rays today.  Plan: XR KNEE 3 VIEW RIGHT, XR KNEE 3 VIEW LEFT.  X-ray showed bilateral knee joint severe osteoarthritis and severe chondromalacia patella.  Chondrocalcinosis was noted in the left knee joint.  Patient was notified of her x-ray results.  She will eventually require total knee replacement.  Primary osteoarthritis of both feet-she has intermittent discomfort in her feet.  No warmth or swelling was noted.  Calcaneal spur of both feet  Hyperuricemia-uric acid was 4.6 on September 06, 2023.  Essential hypertension  MGUS (monoclonal  gammopathy of unknown significance)  Vitamin D  deficiency-vitamin D  was 39 on September 06, 2023.  Osteoporosis screening - 08/19/21 The BMD measured at Femur Neck Left is 1.122 g/cm2 with a T-score of 0.6.  Advised patient to have repeat DEXA scan as she has been on long-term high-dose prednisone  .  Orders: Orders Placed This Encounter  Procedures   XR KNEE 3 VIEW RIGHT   XR KNEE 3 VIEW LEFT   Protein / creatinine ratio, urine   CBC with Differential/Platelet   Comprehensive metabolic panel with GFR   Anti-DNA antibody, double-stranded   C3 and C4   Sedimentation rate   Sjogrens syndrome-A extractable nuclear antibody  Hemoglobin A1c   No orders of the defined types were placed in this encounter.    Follow-Up Instructions: Return in about 6 months (around 09/06/2024) for Systemic lupus.   Maya Nash, MD  Note - This record has been created using Animal nutritionist.  Chart creation errors have been sought, but may not always  have been located. Such creation errors do not reflect on  the standard of medical care.

## 2024-03-07 ENCOUNTER — Ambulatory Visit

## 2024-03-07 ENCOUNTER — Ambulatory Visit: Attending: Rheumatology | Admitting: Rheumatology

## 2024-03-07 ENCOUNTER — Encounter: Payer: Self-pay | Admitting: Rheumatology

## 2024-03-07 ENCOUNTER — Telehealth: Payer: Self-pay

## 2024-03-07 VITALS — BP 121/76 | HR 85 | Resp 16 | Ht 64.0 in | Wt 204.4 lb

## 2024-03-07 DIAGNOSIS — I10 Essential (primary) hypertension: Secondary | ICD-10-CM

## 2024-03-07 DIAGNOSIS — E559 Vitamin D deficiency, unspecified: Secondary | ICD-10-CM

## 2024-03-07 DIAGNOSIS — M170 Bilateral primary osteoarthritis of knee: Secondary | ICD-10-CM

## 2024-03-07 DIAGNOSIS — Z79899 Other long term (current) drug therapy: Secondary | ICD-10-CM

## 2024-03-07 DIAGNOSIS — M1712 Unilateral primary osteoarthritis, left knee: Secondary | ICD-10-CM | POA: Diagnosis not present

## 2024-03-07 DIAGNOSIS — M62838 Other muscle spasm: Secondary | ICD-10-CM

## 2024-03-07 DIAGNOSIS — D472 Monoclonal gammopathy: Secondary | ICD-10-CM

## 2024-03-07 DIAGNOSIS — M7731 Calcaneal spur, right foot: Secondary | ICD-10-CM

## 2024-03-07 DIAGNOSIS — Z7952 Long term (current) use of systemic steroids: Secondary | ICD-10-CM

## 2024-03-07 DIAGNOSIS — M1711 Unilateral primary osteoarthritis, right knee: Secondary | ICD-10-CM | POA: Diagnosis not present

## 2024-03-07 DIAGNOSIS — M19072 Primary osteoarthritis, left ankle and foot: Secondary | ICD-10-CM

## 2024-03-07 DIAGNOSIS — M19071 Primary osteoarthritis, right ankle and foot: Secondary | ICD-10-CM

## 2024-03-07 DIAGNOSIS — M3219 Other organ or system involvement in systemic lupus erythematosus: Secondary | ICD-10-CM

## 2024-03-07 DIAGNOSIS — M19041 Primary osteoarthritis, right hand: Secondary | ICD-10-CM

## 2024-03-07 DIAGNOSIS — Z13820 Encounter for screening for osteoporosis: Secondary | ICD-10-CM

## 2024-03-07 DIAGNOSIS — K754 Autoimmune hepatitis: Secondary | ICD-10-CM | POA: Diagnosis not present

## 2024-03-07 DIAGNOSIS — M7732 Calcaneal spur, left foot: Secondary | ICD-10-CM

## 2024-03-07 DIAGNOSIS — M19042 Primary osteoarthritis, left hand: Secondary | ICD-10-CM

## 2024-03-07 DIAGNOSIS — E790 Hyperuricemia without signs of inflammatory arthritis and tophaceous disease: Secondary | ICD-10-CM

## 2024-03-07 DIAGNOSIS — R55 Syncope and collapse: Secondary | ICD-10-CM

## 2024-03-07 NOTE — Telephone Encounter (Signed)
 Per Dr. Alvira Josephs, contact the patient and advise she has severe arthritis in both knees and they are bone on bone. Contacted the patient and advised Dr. Alvira Josephs looked at her knee xrays and she has severe arthritis in both knees and they are bone on bone. Patient verbalized understanding.

## 2024-03-08 LAB — COMPREHENSIVE METABOLIC PANEL WITH GFR
AG Ratio: 1.5 (calc) (ref 1.0–2.5)
ALT: 28 U/L (ref 6–29)
AST: 33 U/L (ref 10–35)
Albumin: 4.5 g/dL (ref 3.6–5.1)
Alkaline phosphatase (APISO): 129 U/L (ref 37–153)
BUN: 23 mg/dL (ref 7–25)
CO2: 30 mmol/L (ref 20–32)
Calcium: 10 mg/dL (ref 8.6–10.4)
Chloride: 97 mmol/L — ABNORMAL LOW (ref 98–110)
Creat: 0.84 mg/dL (ref 0.60–0.95)
Globulin: 3 g/dL (ref 1.9–3.7)
Glucose, Bld: 169 mg/dL — ABNORMAL HIGH (ref 65–99)
Potassium: 3.7 mmol/L (ref 3.5–5.3)
Sodium: 140 mmol/L (ref 135–146)
Total Bilirubin: 0.6 mg/dL (ref 0.2–1.2)
Total Protein: 7.5 g/dL (ref 6.1–8.1)
eGFR: 68 mL/min/{1.73_m2} (ref >=60)

## 2024-03-08 LAB — SJOGRENS SYNDROME-A EXTRACTABLE NUCLEAR ANTIBODY: SSA (Ro) (ENA) Antibody, IgG: 5.7 AI — AB

## 2024-03-08 LAB — PROTEIN / CREATININE RATIO, URINE
Creatinine, Urine: 9 mg/dL — ABNORMAL LOW (ref 20–275)
Total Protein, Urine: <4 mg/dL — ABNORMAL LOW (ref 5–24)

## 2024-03-08 LAB — CBC WITH DIFFERENTIAL/PLATELET
Absolute Lymphocytes: 1739 {cells}/uL (ref 850–3900)
Absolute Monocytes: 512 {cells}/uL (ref 200–950)
Basophils Absolute: 42 {cells}/uL (ref 0–200)
Basophils Relative: 0.5 %
Eosinophils Absolute: 59 {cells}/uL (ref 15–500)
Eosinophils Relative: 0.7 %
HCT: 47.9 % — ABNORMAL HIGH (ref 35.0–45.0)
Hemoglobin: 15.8 g/dL — ABNORMAL HIGH (ref 11.7–15.5)
MCH: 32.1 pg (ref 27.0–33.0)
MCHC: 33 g/dL (ref 32.0–36.0)
MCV: 97.4 fL (ref 80.0–100.0)
MPV: 11.9 fL (ref 7.5–12.5)
Monocytes Relative: 6.1 %
Neutro Abs: 6048 {cells}/uL (ref 1500–7800)
Neutrophils Relative %: 72 %
Platelets: 206 10*3/uL (ref 140–400)
RBC: 4.92 10*6/uL (ref 3.80–5.10)
RDW: 13 % (ref 11.0–15.0)
Total Lymphocyte: 20.7 %
WBC: 8.4 10*3/uL (ref 3.8–10.8)

## 2024-03-08 LAB — HEMOGLOBIN A1C
Hgb A1c MFr Bld: 8.6 % — ABNORMAL HIGH (ref <5.7)
Mean Plasma Glucose: 200 mg/dL
eAG (mmol/L): 11.1 mmol/L

## 2024-03-08 LAB — C3 AND C4
C3 Complement: 193 mg/dL
C4 Complement: 32 mg/dL

## 2024-03-08 LAB — SEDIMENTATION RATE: Sed Rate: 2 mm/h (ref 0–30)

## 2024-03-08 LAB — ANTI-DNA ANTIBODY, DOUBLE-STRANDED: ds DNA Ab: 2 [IU]/mL

## 2024-03-10 ENCOUNTER — Ambulatory Visit: Payer: Self-pay | Admitting: Physician Assistant

## 2024-07-09 ENCOUNTER — Other Ambulatory Visit: Payer: Self-pay | Admitting: Nurse Practitioner

## 2024-07-09 DIAGNOSIS — Z1231 Encounter for screening mammogram for malignant neoplasm of breast: Secondary | ICD-10-CM

## 2024-07-12 ENCOUNTER — Ambulatory Visit
Admission: RE | Admit: 2024-07-12 | Discharge: 2024-07-12 | Disposition: A | Discharge status: Home / Self Care | Source: Ambulatory Visit | Attending: Nurse Practitioner | Admitting: Nurse Practitioner

## 2024-07-12 DIAGNOSIS — Z1231 Encounter for screening mammogram for malignant neoplasm of breast: Secondary | ICD-10-CM

## 2024-08-26 NOTE — Progress Notes (Unsigned)
 Office Visit Note  Patient: Deanna Hart             Date of Birth: 07/17/1941           MRN: [id number]             PCP: Lenon Nell SAILOR, FNP Referring: Wilkie Majel GORMAN,* Visit Date: 09/06/2024 Occupation: Data Unavailable  Subjective:    History of Present Illness: Deanna Hart is a 83 y.o. female with history of systemic lupus and autoimmune hepatitis.  She remains on prednisone  20 mg daily.     Activities of Daily Living:  Patient reports morning stiffness for *** {minute/hour:19697}.   Patient {ACTIONS;DENIES/REPORTS:21021675::Denies} nocturnal pain.  Difficulty dressing/grooming: {ACTIONS;DENIES/REPORTS:21021675::Denies} Difficulty climbing stairs: {ACTIONS;DENIES/REPORTS:21021675::Denies} Difficulty getting out of chair: {ACTIONS;DENIES/REPORTS:21021675::Denies} Difficulty using hands for taps, buttons, cutlery, and/or writing: {ACTIONS;DENIES/REPORTS:21021675::Denies}  No Rheumatology ROS completed.   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   MGUS (monoclonal gammopathy of unknown significance) 06/01/2017   Hyperuricemia 06/01/2017   Elevated LFTs 03/03/2017   Acute pain of right shoulder 11/28/2016   High risk medications (not anticoagulants) long-term use 07/19/2016   Rheumatoid factor positive 07/19/2016   SLE (systemic lupus erythematosus) (HCC) 07/18/2016    Primary osteoarthritis of both hands 07/18/2016   Primary osteoarthritis of both feet 07/18/2016   Calcaneal spur of both feet 07/18/2016   Osteoarthritis of both knees 07/18/2016   Hypertension 07/18/2016   Obesity 07/18/2016   Vitamin D  deficiency 07/18/2016    Past Medical History:  Diagnosis Date   Calcaneal spur of both feet 07/18/2016   Gout    Hypertension 07/18/2016   Obesity 07/18/2016   Osteoarthritis of both feet 07/18/2016   Osteoarthritis of both hands 07/18/2016   Osteoarthritis of both knees 07/18/2016   Severe Right lateral, Left med.    SLE (systemic lupus erythematosus) (HCC) 07/18/2016   Positive ANA, Positive Ro, Positive Smith, Positive RNP, Positive RF, Negative CCP,  Elevated LFTs   Vitamin D  deficiency 07/18/2016    Family History  Problem Relation Age of Onset   Cancer Maternal Uncle    Cancer Brother    Breast cancer Neg Hx    Past Surgical History:  Procedure Laterality Date   BREAST EXCISIONAL BIOPSY Left    BREAST SURGERY     EYE SURGERY Bilateral 07/2017   cataracts   Social History   Tobacco Use   Smoking status: Former    Current packs/day: 0.00    Average packs/day: 0.1 packs/day for 3.0 years (0.3 ttl pk-yrs)    Types: Cigarettes    Start date: 69    Quit date: 49    Years since quitting: 53.9    Passive exposure: Past   Smokeless tobacco: Former    Types: Chew    Quit date: 1972  Vaping Use   Vaping status: Never Used  Substance Use Topics   Alcohol use: No   Drug use:  Never   Social History   Social History Narrative   Not on file     Immunization History  Administered Date(s) Administered   INFLUENZA, HIGH DOSE SEASONAL PF 05/28/2019, 06/07/2021, 10/19/2022   Moderna Sars-Covid-2 Vaccination 12/19/2019   PFIZER(Purple Top)SARS-COV-2 Vaccination 12/19/2019, 01/13/2020, 09/30/2020   Pneumococcal Polysaccharide-23 06/07/2021     Objective: Vital Signs: There were no vitals taken for this visit.   Physical  Exam Vitals and nursing note reviewed.  Constitutional:      Appearance: She is well-developed.  HENT:     Head: Normocephalic and atraumatic.  Eyes:     Conjunctiva/sclera: Conjunctivae normal.  Cardiovascular:     Rate and Rhythm: Normal rate and regular rhythm.     Heart sounds: Normal heart sounds.  Pulmonary:     Effort: Pulmonary effort is normal.     Breath sounds: Normal breath sounds.  Abdominal:     General: Bowel sounds are normal.     Palpations: Abdomen is soft.  Musculoskeletal:     Cervical back: Normal range of motion.  Lymphadenopathy:     Cervical: No cervical adenopathy.  Skin:    General: Skin is warm and dry.     Capillary Refill: Capillary refill takes less than 2 seconds.  Neurological:     Mental Status: She is alert and oriented to person, place, and time.  Psychiatric:        Behavior: Behavior normal.      Musculoskeletal Exam: ***  CDAI Exam: CDAI Score: -- Patient Global: --; Provider Global: -- Swollen: --; Tender: -- Joint Exam 09/06/2024   No joint exam has been documented for this visit   There is currently no information documented on the homunculus. Go to the Rheumatology activity and complete the homunculus joint exam.  Investigation: No additional findings.  Imaging: No results found.  Recent Labs: Lab Results  Component Value Date   WBC 8.4 03/07/2024   HGB 15.8 (H) 03/07/2024   PLT 206 03/07/2024   NA 140 03/07/2024   K 3.7 03/07/2024   CL 97 (L) 03/07/2024   CO2 30 03/07/2024   GLUCOSE 169 (H) 03/07/2024   BUN 23 03/07/2024   CREATININE 0.84 03/07/2024   BILITOT 0.6 03/07/2024   ALKPHOS 82 09/20/2021   AST 33 03/07/2024   ALT 28 03/07/2024   PROT 7.5 03/07/2024   ALBUMIN 3.7 09/20/2021   CALCIUM 10.0 03/07/2024   GFRAA 96 07/01/2020    Speciality Comments: No specialty comments available.  Procedures:  No procedures performed Allergies: Plaquenil [hydroxychloroquine sulfate]   Assessment / Plan:      Visit Diagnoses: Other systemic lupus erythematosus with other organ involvement (HCC)  High risk medication use  Long term (current) use of systemic steroids  Autoimmune hepatitis (HCC)  Primary osteoarthritis of both hands  Primary osteoarthritis of both knees  Primary osteoarthritis of both feet  Calcaneal spur of both feet  Hyperuricemia  Essential hypertension  MGUS (monoclonal gammopathy of unknown significance)  Vitamin D  deficiency  Orders: No orders of the defined types were placed in this encounter.  No orders of the defined types were placed in this encounter.   Face-to-face time spent with patient was *** minutes. Greater than 50% of time was spent in counseling and coordination of care.  Follow-Up Instructions: No follow-ups on file.   Waddell CHRISTELLA Craze, PA-C  Note - This record has been created using Dragon software.  Chart creation errors have been sought, but may not always  have been located. Such creation errors do not reflect on  the standard of medical care.

## 2024-09-06 ENCOUNTER — Encounter: Payer: Self-pay | Admitting: Physician Assistant

## 2024-09-06 ENCOUNTER — Ambulatory Visit: Attending: Physician Assistant | Admitting: Physician Assistant

## 2024-09-06 VITALS — BP 117/83 | HR 83 | Temp 97.7°F | Resp 14 | Ht 64.0 in | Wt 197.6 lb

## 2024-09-06 DIAGNOSIS — M19072 Primary osteoarthritis, left ankle and foot: Secondary | ICD-10-CM

## 2024-09-06 DIAGNOSIS — I10 Essential (primary) hypertension: Secondary | ICD-10-CM

## 2024-09-06 DIAGNOSIS — M7732 Calcaneal spur, left foot: Secondary | ICD-10-CM

## 2024-09-06 DIAGNOSIS — M19041 Primary osteoarthritis, right hand: Secondary | ICD-10-CM | POA: Diagnosis not present

## 2024-09-06 DIAGNOSIS — Z7952 Long term (current) use of systemic steroids: Secondary | ICD-10-CM | POA: Diagnosis not present

## 2024-09-06 DIAGNOSIS — E790 Hyperuricemia without signs of inflammatory arthritis and tophaceous disease: Secondary | ICD-10-CM | POA: Diagnosis not present

## 2024-09-06 DIAGNOSIS — M7731 Calcaneal spur, right foot: Secondary | ICD-10-CM | POA: Diagnosis not present

## 2024-09-06 DIAGNOSIS — K754 Autoimmune hepatitis: Secondary | ICD-10-CM | POA: Diagnosis not present

## 2024-09-06 DIAGNOSIS — M19071 Primary osteoarthritis, right ankle and foot: Secondary | ICD-10-CM | POA: Diagnosis not present

## 2024-09-06 DIAGNOSIS — E559 Vitamin D deficiency, unspecified: Secondary | ICD-10-CM

## 2024-09-06 DIAGNOSIS — M170 Bilateral primary osteoarthritis of knee: Secondary | ICD-10-CM | POA: Diagnosis not present

## 2024-09-06 DIAGNOSIS — M19042 Primary osteoarthritis, left hand: Secondary | ICD-10-CM

## 2024-09-06 DIAGNOSIS — M3219 Other organ or system involvement in systemic lupus erythematosus: Secondary | ICD-10-CM | POA: Diagnosis not present

## 2024-09-06 DIAGNOSIS — Z79899 Other long term (current) drug therapy: Secondary | ICD-10-CM | POA: Diagnosis not present

## 2024-09-06 DIAGNOSIS — D472 Monoclonal gammopathy: Secondary | ICD-10-CM

## 2024-09-07 ENCOUNTER — Ambulatory Visit: Payer: Self-pay | Admitting: Physician Assistant

## 2024-09-07 NOTE — Progress Notes (Signed)
 Hgb A1c remains elevated but has improved.  Urine protein creatinine ratio remains slightly elevated.   Hgb and hct remain borderline elevated, rest of CBC WNL. Glucose is 145.  AST and ALT are elevated-please notify the patient and forward results to Dr. Rollin as requested.  She remains on prednisone  20 mg daily.   Complements WNL ESR WNL Uric acid is 6.7--please make sure the patient is taking allopurinol  as prescribed.

## 2024-09-09 LAB — COMPREHENSIVE METABOLIC PANEL WITH GFR
AG Ratio: 1.3 (calc) (ref 1.0–2.5)
ALT: 34 U/L — ABNORMAL HIGH (ref 6–29)
AST: 43 U/L — ABNORMAL HIGH (ref 10–35)
Albumin: 4 g/dL (ref 3.6–5.1)
Alkaline phosphatase (APISO): 108 U/L (ref 37–153)
BUN/Creatinine Ratio: 19 (calc) (ref 6–22)
BUN: 11 mg/dL (ref 7–25)
CO2: 27 mmol/L (ref 20–32)
Calcium: 9.1 mg/dL (ref 8.6–10.4)
Chloride: 101 mmol/L (ref 98–110)
Creat: 0.59 mg/dL — ABNORMAL LOW (ref 0.60–0.95)
Globulin: 3.2 g/dL (ref 1.9–3.7)
Glucose, Bld: 145 mg/dL — ABNORMAL HIGH (ref 65–99)
Potassium: 3.5 mmol/L (ref 3.5–5.3)
Sodium: 139 mmol/L (ref 135–146)
Total Bilirubin: 0.9 mg/dL (ref 0.2–1.2)
Total Protein: 7.2 g/dL (ref 6.1–8.1)
eGFR: 89 mL/min/1.73m2

## 2024-09-09 LAB — CBC WITH DIFFERENTIAL/PLATELET
Absolute Lymphocytes: 2059 {cells}/uL (ref 850–3900)
Absolute Monocytes: 949 {cells}/uL (ref 200–950)
Basophils Absolute: 51 {cells}/uL (ref 0–200)
Basophils Relative: 0.7 %
Eosinophils Absolute: 73 {cells}/uL (ref 15–500)
Eosinophils Relative: 1 %
HCT: 47.4 % — ABNORMAL HIGH (ref 35.9–46.0)
Hemoglobin: 15.8 g/dL — ABNORMAL HIGH (ref 11.7–15.5)
MCH: 31.1 pg (ref 27.0–33.0)
MCHC: 33.3 g/dL (ref 31.6–35.4)
MCV: 93.3 fL (ref 81.4–101.7)
MPV: 11.8 fL (ref 7.5–12.5)
Monocytes Relative: 13 %
Neutro Abs: 4168 {cells}/uL (ref 1500–7800)
Neutrophils Relative %: 57.1 %
Platelets: 195 Thousand/uL (ref 140–400)
RBC: 5.08 Million/uL (ref 3.80–5.10)
RDW: 13.3 % (ref 11.0–15.0)
Total Lymphocyte: 28.2 %
WBC: 7.3 Thousand/uL (ref 3.8–10.8)

## 2024-09-09 LAB — ANTI-NUCLEAR AB-TITER (ANA TITER): ANA Titer 1: 1:1280 {titer} — ABNORMAL HIGH

## 2024-09-09 LAB — ANA: Anti Nuclear Antibody (ANA): POSITIVE — AB

## 2024-09-09 LAB — PROTEIN / CREATININE RATIO, URINE
Creatinine, Urine: 48 mg/dL (ref 20–275)
Protein/Creat Ratio: 188 mg/g{creat} — ABNORMAL HIGH (ref 24–184)
Protein/Creatinine Ratio: 0.188 mg/mg{creat} — ABNORMAL HIGH (ref 0.024–0.184)
Total Protein, Urine: 9 mg/dL (ref 5–24)

## 2024-09-09 LAB — URIC ACID: Uric Acid, Serum: 6.7 mg/dL (ref 2.5–7.0)

## 2024-09-09 LAB — SEDIMENTATION RATE: Sed Rate: 6 mm/h (ref 0–30)

## 2024-09-09 LAB — C3 AND C4
C3 Complement: 175 mg/dL
C4 Complement: 31 mg/dL

## 2024-09-09 LAB — HEMOGLOBIN A1C
Hgb A1c MFr Bld: 7.9 % — ABNORMAL HIGH
Mean Plasma Glucose: 180 mg/dL
eAG (mmol/L): 10 mmol/L

## 2024-09-09 LAB — ANTI-DNA ANTIBODY, DOUBLE-STRANDED: ds DNA Ab: 2 [IU]/mL

## 2024-09-09 NOTE — Progress Notes (Signed)
dsDNA negative  Complements WNL

## 2024-09-10 NOTE — Progress Notes (Signed)
 ANA remains positive.

## 2025-02-04 ENCOUNTER — Ambulatory Visit: Admitting: Physician Assistant
